# Patient Record
Sex: Female | Born: 1935 | Race: White | Hispanic: No | Smoking: Never smoker
Health system: Southern US, Community
[De-identification: ages and names within clinical notes are randomized; demographics above are authoritative.]

## PROBLEM LIST (undated history)

## (undated) DIAGNOSIS — H269 Unspecified cataract: Secondary | ICD-10-CM

## (undated) DIAGNOSIS — B181 Chronic viral hepatitis B without delta-agent: Secondary | ICD-10-CM

## (undated) DIAGNOSIS — M8580 Other specified disorders of bone density and structure, unspecified site: Secondary | ICD-10-CM

## (undated) DIAGNOSIS — Q7649 Other congenital malformations of spine, not associated with scoliosis: Secondary | ICD-10-CM

## (undated) DIAGNOSIS — J45909 Unspecified asthma, uncomplicated: Secondary | ICD-10-CM

## (undated) DIAGNOSIS — R748 Abnormal levels of other serum enzymes: Secondary | ICD-10-CM

## (undated) DIAGNOSIS — K219 Gastro-esophageal reflux disease without esophagitis: Secondary | ICD-10-CM

## (undated) DIAGNOSIS — M79673 Pain in unspecified foot: Secondary | ICD-10-CM

## (undated) DIAGNOSIS — J189 Pneumonia, unspecified organism: Secondary | ICD-10-CM

## (undated) DIAGNOSIS — E785 Hyperlipidemia, unspecified: Secondary | ICD-10-CM

## (undated) DIAGNOSIS — M1990 Unspecified osteoarthritis, unspecified site: Secondary | ICD-10-CM

## (undated) HISTORY — DX: Unspecified osteoarthritis, unspecified site: M19.90

## (undated) HISTORY — DX: Pain in unspecified foot: M79.673

## (undated) HISTORY — DX: Gastro-esophageal reflux disease without esophagitis: K21.9

## (undated) HISTORY — DX: Pneumonia, unspecified organism: J18.9

## (undated) HISTORY — PX: PARTIAL HYSTERECTOMY: SHX80

## (undated) HISTORY — DX: Abnormal levels of other serum enzymes: R74.8

## (undated) HISTORY — DX: Hyperlipidemia, unspecified: E78.5

## (undated) HISTORY — DX: Unspecified cataract: H26.9

## (undated) HISTORY — PX: OTHER SURGICAL HISTORY: SHX169

## (undated) HISTORY — DX: Chronic viral hepatitis B without delta-agent: B18.1

## (undated) HISTORY — DX: Other specified disorders of bone density and structure, unspecified site: M85.80

## (undated) HISTORY — PX: BACK SURGERY: SHX140

## (undated) HISTORY — DX: Other congenital malformations of spine, not associated with scoliosis: Q76.49

---

## 1998-11-25 ENCOUNTER — Encounter: Payer: Self-pay | Admitting: *Deleted

## 1998-11-28 ENCOUNTER — Ambulatory Visit (HOSPITAL_COMMUNITY): Admission: RE | Admit: 1998-11-28 | Discharge: 1998-11-28 | Payer: Self-pay | Admitting: *Deleted

## 1999-11-25 ENCOUNTER — Encounter: Admission: RE | Admit: 1999-11-25 | Discharge: 1999-11-25 | Payer: Self-pay | Admitting: Family Medicine

## 1999-11-25 ENCOUNTER — Encounter: Payer: Self-pay | Admitting: Family Medicine

## 2000-12-02 ENCOUNTER — Encounter: Admission: RE | Admit: 2000-12-02 | Discharge: 2000-12-02 | Payer: Self-pay | Admitting: Family Medicine

## 2000-12-02 ENCOUNTER — Encounter: Payer: Self-pay | Admitting: Family Medicine

## 2001-09-05 HISTORY — PX: US ECHOCARDIOGRAPHY: HXRAD669

## 2001-09-19 ENCOUNTER — Other Ambulatory Visit: Admission: RE | Admit: 2001-09-19 | Discharge: 2001-09-19 | Payer: Self-pay | Admitting: Family Medicine

## 2001-09-26 ENCOUNTER — Encounter: Payer: Self-pay | Admitting: Family Medicine

## 2001-09-26 ENCOUNTER — Encounter: Admission: RE | Admit: 2001-09-26 | Discharge: 2001-09-26 | Payer: Self-pay | Admitting: Family Medicine

## 2002-03-08 DIAGNOSIS — J189 Pneumonia, unspecified organism: Secondary | ICD-10-CM

## 2002-03-08 HISTORY — DX: Pneumonia, unspecified organism: J18.9

## 2002-04-26 ENCOUNTER — Encounter: Payer: Self-pay | Admitting: Family Medicine

## 2002-04-26 ENCOUNTER — Encounter: Admission: RE | Admit: 2002-04-26 | Discharge: 2002-04-26 | Payer: Self-pay | Admitting: Family Medicine

## 2002-08-08 ENCOUNTER — Inpatient Hospital Stay (HOSPITAL_COMMUNITY): Admission: EM | Admit: 2002-08-08 | Discharge: 2002-08-12 | Payer: Self-pay | Admitting: Emergency Medicine

## 2002-08-08 ENCOUNTER — Encounter: Payer: Self-pay | Admitting: Internal Medicine

## 2002-08-08 ENCOUNTER — Encounter: Payer: Self-pay | Admitting: Emergency Medicine

## 2002-08-12 ENCOUNTER — Encounter: Payer: Self-pay | Admitting: Internal Medicine

## 2002-10-08 ENCOUNTER — Ambulatory Visit (HOSPITAL_COMMUNITY): Admission: RE | Admit: 2002-10-08 | Discharge: 2002-10-08 | Payer: Self-pay | Admitting: Family Medicine

## 2002-10-08 ENCOUNTER — Encounter: Payer: Self-pay | Admitting: Family Medicine

## 2003-08-02 ENCOUNTER — Encounter: Admission: RE | Admit: 2003-08-02 | Discharge: 2003-08-02 | Payer: Self-pay | Admitting: Family Medicine

## 2004-05-07 ENCOUNTER — Ambulatory Visit: Payer: Self-pay | Admitting: Family Medicine

## 2004-07-27 ENCOUNTER — Ambulatory Visit: Payer: Self-pay | Admitting: Family Medicine

## 2004-08-07 ENCOUNTER — Ambulatory Visit: Payer: Self-pay | Admitting: Internal Medicine

## 2004-10-26 ENCOUNTER — Ambulatory Visit: Payer: Self-pay | Admitting: Family Medicine

## 2004-10-26 ENCOUNTER — Other Ambulatory Visit: Admission: RE | Admit: 2004-10-26 | Discharge: 2004-10-26 | Payer: Self-pay | Admitting: Family Medicine

## 2004-11-02 ENCOUNTER — Ambulatory Visit: Payer: Self-pay | Admitting: Family Medicine

## 2004-11-06 HISTORY — PX: COLONOSCOPY: SHX174

## 2004-11-11 ENCOUNTER — Ambulatory Visit: Payer: Self-pay | Admitting: Internal Medicine

## 2004-11-17 ENCOUNTER — Encounter: Admission: RE | Admit: 2004-11-17 | Discharge: 2004-11-17 | Payer: Self-pay | Admitting: Family Medicine

## 2004-11-23 ENCOUNTER — Ambulatory Visit: Payer: Self-pay | Admitting: Internal Medicine

## 2005-01-01 ENCOUNTER — Ambulatory Visit: Payer: Self-pay | Admitting: Internal Medicine

## 2005-03-08 HISTORY — PX: US RENAL/AORTA: HXRAD530

## 2005-06-10 ENCOUNTER — Ambulatory Visit: Payer: Self-pay | Admitting: Family Medicine

## 2005-07-12 ENCOUNTER — Ambulatory Visit: Payer: Self-pay | Admitting: Family Medicine

## 2005-07-28 ENCOUNTER — Ambulatory Visit: Payer: Self-pay | Admitting: Family Medicine

## 2005-10-19 ENCOUNTER — Ambulatory Visit: Payer: Self-pay | Admitting: Family Medicine

## 2005-10-28 ENCOUNTER — Ambulatory Visit: Payer: Self-pay | Admitting: Cardiology

## 2005-11-06 HISTORY — PX: CARDIOVASCULAR STRESS TEST: SHX262

## 2005-11-16 ENCOUNTER — Encounter: Payer: Self-pay | Admitting: Cardiology

## 2005-11-16 ENCOUNTER — Ambulatory Visit: Payer: Self-pay

## 2005-11-29 ENCOUNTER — Ambulatory Visit: Payer: Self-pay | Admitting: Cardiology

## 2005-12-07 ENCOUNTER — Ambulatory Visit: Payer: Self-pay | Admitting: Family Medicine

## 2006-04-08 ENCOUNTER — Encounter: Admission: RE | Admit: 2006-04-08 | Discharge: 2006-04-08 | Payer: Self-pay | Admitting: Family Medicine

## 2006-12-16 ENCOUNTER — Ambulatory Visit: Payer: Self-pay | Admitting: Family Medicine

## 2007-02-28 ENCOUNTER — Encounter (INDEPENDENT_AMBULATORY_CARE_PROVIDER_SITE_OTHER): Payer: Self-pay | Admitting: *Deleted

## 2007-03-08 ENCOUNTER — Ambulatory Visit: Payer: Self-pay | Admitting: Family Medicine

## 2007-03-27 ENCOUNTER — Encounter (INDEPENDENT_AMBULATORY_CARE_PROVIDER_SITE_OTHER): Payer: Self-pay | Admitting: *Deleted

## 2007-03-29 ENCOUNTER — Encounter (INDEPENDENT_AMBULATORY_CARE_PROVIDER_SITE_OTHER): Payer: Self-pay | Admitting: *Deleted

## 2007-04-24 ENCOUNTER — Encounter: Admission: RE | Admit: 2007-04-24 | Discharge: 2007-04-24 | Payer: Self-pay | Admitting: Family Medicine

## 2007-04-26 ENCOUNTER — Encounter (INDEPENDENT_AMBULATORY_CARE_PROVIDER_SITE_OTHER): Payer: Self-pay | Admitting: *Deleted

## 2007-04-26 ENCOUNTER — Telehealth: Payer: Self-pay | Admitting: Family Medicine

## 2007-04-26 ENCOUNTER — Ambulatory Visit: Payer: Self-pay | Admitting: Family Medicine

## 2007-05-02 ENCOUNTER — Telehealth: Payer: Self-pay | Admitting: Family Medicine

## 2007-05-12 ENCOUNTER — Ambulatory Visit: Payer: Self-pay | Admitting: Family Medicine

## 2007-05-16 ENCOUNTER — Emergency Department (HOSPITAL_COMMUNITY): Admission: EM | Admit: 2007-05-16 | Discharge: 2007-05-16 | Payer: Self-pay | Admitting: Family Medicine

## 2007-05-25 ENCOUNTER — Ambulatory Visit: Payer: Self-pay | Admitting: Family Medicine

## 2007-05-29 ENCOUNTER — Ambulatory Visit: Payer: Self-pay | Admitting: Family Medicine

## 2007-05-29 LAB — CONVERTED CEMR LAB
ALT: 20 units/L (ref 0–35)
AST: 22 units/L (ref 0–37)
Albumin: 3.6 g/dL (ref 3.5–5.2)
Alkaline Phosphatase: 101 units/L (ref 39–117)
Basophils Absolute: 0 10*3/uL (ref 0.0–0.1)
Basophils Relative: 0.1 % (ref 0.0–1.0)
CO2: 34 meq/L — ABNORMAL HIGH (ref 19–32)
Chloride: 100 meq/L (ref 96–112)
Creatinine, Ser: 0.8 mg/dL (ref 0.4–1.2)
GFR calc non Af Amer: 75 mL/min
Glucose, Bld: 78 mg/dL (ref 70–99)
HDL: 51 mg/dL (ref >39.0)
LDL Cholesterol: 116 mg/dL — ABNORMAL HIGH (ref 0–99)
MCV: 88.8 fL (ref 78.0–100.0)
Monocytes Relative: 7.9 % (ref 3.0–11.0)
Neutro Abs: 6.6 10*3/uL (ref 1.4–7.7)
Neutrophils Relative %: 64.5 % (ref 43.0–77.0)
Platelets: 294 10*3/uL (ref 150–400)
Potassium: 3.9 meq/L (ref 3.5–5.1)
Total Bilirubin: 0.7 mg/dL (ref 0.3–1.2)
VLDL: 14 mg/dL (ref 0–40)
WBC: 10.1 10*3/uL (ref 4.5–10.5)

## 2007-05-30 ENCOUNTER — Ambulatory Visit: Payer: Self-pay | Admitting: Family Medicine

## 2007-05-30 ENCOUNTER — Ambulatory Visit: Payer: Self-pay

## 2007-05-31 LAB — CONVERTED CEMR LAB
Basophils Relative: 0.4 % (ref 0.0–1.0)
Eosinophils Absolute: 0.1 10*3/uL (ref 0.0–0.6)
Eosinophils Relative: 0.7 % (ref 0.0–5.0)
Hemoglobin: 13.4 g/dL (ref 12.0–15.0)
Lymphocytes Relative: 24.9 % (ref 12.0–46.0)
MCHC: 33 g/dL (ref 30.0–36.0)
Monocytes Absolute: 1.1 10*3/uL — ABNORMAL HIGH (ref 0.2–0.7)
Monocytes Relative: 9.6 % (ref 3.0–11.0)
RBC: 4.53 M/uL (ref 3.87–5.11)
RDW: 12.3 % (ref 11.5–14.6)

## 2007-06-01 ENCOUNTER — Encounter: Payer: Self-pay | Admitting: Family Medicine

## 2007-07-14 ENCOUNTER — Encounter: Payer: Self-pay | Admitting: Family Medicine

## 2007-09-05 ENCOUNTER — Encounter: Payer: Self-pay | Admitting: Family Medicine

## 2007-12-04 ENCOUNTER — Ambulatory Visit: Payer: Self-pay | Admitting: Family Medicine

## 2007-12-11 ENCOUNTER — Ambulatory Visit: Payer: Self-pay | Admitting: Family Medicine

## 2008-02-20 ENCOUNTER — Encounter: Payer: Self-pay | Admitting: Family Medicine

## 2008-02-26 ENCOUNTER — Telehealth: Payer: Self-pay | Admitting: Family Medicine

## 2008-04-24 ENCOUNTER — Encounter: Admission: RE | Admit: 2008-04-24 | Discharge: 2008-04-24 | Payer: Self-pay | Admitting: Family Medicine

## 2008-04-30 ENCOUNTER — Encounter (INDEPENDENT_AMBULATORY_CARE_PROVIDER_SITE_OTHER): Payer: Self-pay | Admitting: *Deleted

## 2008-06-12 ENCOUNTER — Ambulatory Visit: Payer: Self-pay | Admitting: Family Medicine

## 2008-06-12 LAB — CONVERTED CEMR LAB
Bilirubin Urine: NEGATIVE
Casts: 0 /lpf
Protein, U semiquant: NEGATIVE
Urine crystals, microscopic: 0 /hpf
WBC, UA: 0 cells/hpf
Yeast, UA: 0

## 2008-06-17 ENCOUNTER — Ambulatory Visit: Payer: Self-pay | Admitting: Cardiovascular Disease

## 2008-06-17 ENCOUNTER — Encounter: Payer: Self-pay | Admitting: Family Medicine

## 2008-06-17 LAB — CONVERTED CEMR LAB
Basophils Absolute: 0 10*3/uL (ref 0.0–0.1)
Basophils Relative: 0.1 % (ref 0.0–3.0)
CO2: 35 meq/L — ABNORMAL HIGH (ref 19–32)
Chloride: 104 meq/L (ref 96–112)
Cholesterol: 201 mg/dL — ABNORMAL HIGH (ref 0–200)
Eosinophils Absolute: 0.1 10*3/uL (ref 0.0–0.7)
Eosinophils Relative: 1.6 % (ref 0.0–5.0)
HDL: 60 mg/dL (ref >39.00)
Hemoglobin: 13.4 g/dL (ref 12.0–15.0)
Lymphocytes Relative: 39.4 % (ref 12.0–46.0)
MCHC: 33.7 g/dL (ref 30.0–36.0)
Monocytes Relative: 10.3 % (ref 3.0–12.0)
RBC: 4.4 M/uL (ref 3.87–5.11)
VLDL: 14.6 mg/dL (ref 0.0–40.0)

## 2008-06-19 ENCOUNTER — Telehealth (INDEPENDENT_AMBULATORY_CARE_PROVIDER_SITE_OTHER): Payer: Self-pay | Admitting: *Deleted

## 2008-06-20 ENCOUNTER — Ambulatory Visit: Payer: Self-pay

## 2008-06-20 ENCOUNTER — Encounter: Payer: Self-pay | Admitting: Cardiology

## 2008-09-05 ENCOUNTER — Encounter: Payer: Self-pay | Admitting: Family Medicine

## 2008-09-13 ENCOUNTER — Ambulatory Visit: Payer: Self-pay | Admitting: Family Medicine

## 2008-09-13 LAB — CONVERTED CEMR LAB
Bilirubin Urine: NEGATIVE
Glucose, Urine, Semiquant: NEGATIVE
Nitrite: NEGATIVE
Specific Gravity, Urine: 1.015
Urobilinogen, UA: 0.2

## 2008-09-14 ENCOUNTER — Encounter (INDEPENDENT_AMBULATORY_CARE_PROVIDER_SITE_OTHER): Payer: Self-pay | Admitting: Internal Medicine

## 2008-12-10 ENCOUNTER — Ambulatory Visit: Payer: Self-pay | Admitting: Family Medicine

## 2009-01-22 ENCOUNTER — Encounter: Payer: Self-pay | Admitting: Family Medicine

## 2009-03-13 ENCOUNTER — Encounter: Payer: Self-pay | Admitting: Family Medicine

## 2009-08-21 ENCOUNTER — Encounter: Admission: RE | Admit: 2009-08-21 | Discharge: 2009-08-21 | Payer: Self-pay | Admitting: Family Medicine

## 2009-08-27 ENCOUNTER — Encounter (INDEPENDENT_AMBULATORY_CARE_PROVIDER_SITE_OTHER): Payer: Self-pay | Admitting: *Deleted

## 2009-09-11 ENCOUNTER — Encounter: Payer: Self-pay | Admitting: Family Medicine

## 2009-10-07 ENCOUNTER — Ambulatory Visit: Payer: Self-pay | Admitting: Family Medicine

## 2009-10-15 ENCOUNTER — Ambulatory Visit: Payer: Self-pay | Admitting: Family Medicine

## 2009-10-30 ENCOUNTER — Ambulatory Visit: Payer: Self-pay | Admitting: Family Medicine

## 2009-10-31 LAB — CONVERTED CEMR LAB
AST: 25 units/L (ref 0–37)
Albumin: 3.9 g/dL (ref 3.5–5.2)
Basophils Absolute: 0 10*3/uL (ref 0.0–0.1)
Bilirubin, Direct: 0.1 mg/dL (ref 0.0–0.3)
Chloride: 103 meq/L (ref 96–112)
Cholesterol: 216 mg/dL — ABNORMAL HIGH (ref 0–200)
Eosinophils Absolute: 0.1 10*3/uL (ref 0.0–0.7)
HDL: 55.3 mg/dL (ref >39.00)
Hemoglobin: 13.6 g/dL (ref 12.0–15.0)
Hep B S Ab: NEGATIVE
Hepatitis B Surface Ag: NEGATIVE
Lymphocytes Relative: 36.2 % (ref 12.0–46.0)
Lymphs Abs: 3.3 10*3/uL (ref 0.7–4.0)
MCV: 91.2 fL (ref 78.0–100.0)
Monocytes Relative: 9.5 % (ref 3.0–12.0)
Neutro Abs: 4.9 10*3/uL (ref 1.4–7.7)
Phosphorus: 3.5 mg/dL (ref 2.3–4.6)
TSH: 1.82 microintl units/mL (ref 0.35–5.50)
Total Bilirubin: 0.8 mg/dL (ref 0.3–1.2)
Total CHOL/HDL Ratio: 4
Vit D, 25-Hydroxy: 73 ng/mL (ref 30–89)

## 2009-12-18 ENCOUNTER — Ambulatory Visit: Payer: Self-pay | Admitting: Family Medicine

## 2010-04-09 NOTE — Letter (Signed)
Summary: [facility]  [facility]   Imported By: Lanelle Bal 04/08/2009 10:10:23  _____________________________________________________________________  External Attachment:    Type:   Image     Comment:   External Document

## 2010-04-09 NOTE — Letter (Signed)
Summary: [facility]  [facility]   Imported By: Lanelle Bal 09/18/2009 10:53:53  _____________________________________________________________________  External Attachment:    Type:   Image     Comment:   External Document

## 2010-04-09 NOTE — Miscellaneous (Signed)
Summary: Controlled Substance Agreement  Controlled Substance Agreement   Imported By: Lanelle Bal 11/06/2009 11:25:25  _____________________________________________________________________  External Attachment:    Type:   Image     Comment:   External Document

## 2010-04-09 NOTE — Assessment & Plan Note (Signed)
Summary: RECHECK THUMB   Vital Signs:  Patient profile:   76 year old female Height:      60 inches Weight:      137 pounds BMI:     26.85 Temp:     98.6 degrees F oral Pulse rate:   76 / minute Pulse rhythm:   regular BP sitting:   108 / 68  (left arm) Cuff size:   regular  Vitals Entered By: Lewanda Rife LPN (October 15, 2009 12:14 PM) CC: recheck left thumb   History of Present Illness: here for re check of thumb with bad arthritis - erosive DIP changes on x ray nl uric acid   overall  better but not resolved  wore splint- no longer needs it  is no longer needing pain med   Allergies: 1)  ! * Evista 2)  Fosamax 3)  * Contrast Dye  Past History:  Past Medical History: Last updated: 06/12/2008 GERD Osteoarthritis pneumonia 2004 foot problems L - ? nerve problem- proceedure mild hyperlipidemia    derm-  Past Surgical History: Last updated: 03/08/2007 Hysterectomy- partial Back surgery pneumonia 2004 "Blocked kidney" Dexa- osteopenia (11/1999) improved (12/2001), decreased BMD (07/2004) Abd [place]- normal (09/2001) Colonoscopy- diverticulosis, [facility] (11/2004) Renal US- neg (2007) Stress myoview- neg (11/2005)  Family History: Last updated: 06/12/2008 Father: lung ca Mother: deceased - dementia, tia, PE, OP brother MI brother with leukemia 2010  twin sister bladder cancer  brother cva brother etoh  Social History: Last updated: 06/12/2008 Marital Status: widowed- lost husb nov 2004 Children: none Occupation: retired sometimes walks for exercise  planning trip to [place] 7/10  Risk Factors: Smoking Status: never (03/08/2007)  Review of Systems General:  Denies fatigue, fever, and loss of appetite. Eyes:  Denies blurring and eye irritation. CV:  Denies chest pain or discomfort, palpitations, and shortness of breath with exertion. Resp:  Denies cough. MS:  Complains of joint pain and stiffness; denies joint swelling, cramps, and muscle  weakness. Derm:  Denies poor wound healing and rash. Neuro:  Denies numbness, tingling, and weakness.  Physical Exam  General:  Well-developed,well-nourished,in no acute distress; alert,appropriate and cooperative throughout examination Head:  normocephalic, atraumatic, and no abnormalities observed.   Neck:  No deformities, masses, or tenderness noted. Heart:  Normal rate and regular rhythm. S1 and S2 normal without gallop, murmur, click, rub or other extra sounds. Msk:  L thumb has changes of oa and some mild MCP tenderness no longer red/swollen/ bruised or warm nl rom  nl sensation and perfusion Pulses:  R and L carotid,radial,femoral,dorsalis pedis and posterior tibial pulses are full and equal bilaterally Extremities:  no CCE Neurologic:  strength normal in all extremities, sensation intact to light touch, and DTRs symmetrical and normal.   Skin:  Intact without suspicious lesions or rashes Cervical Nodes:  No lymphadenopathy noted Psych:  normal affect, talkative and pleasant    Impression & Recommendations:  Problem # 1:  THUMB PAIN, LEFT (ICD-729.5) Assessment Improved this is much imp after splinting and short course of nsaid pt can now use it rev x ray showing severe oa  nl uric acid - so doubt gout if pain returns - adv to use splint and update me/ may consider hand surgeon visit  Complete Medication List: 1)  Omeprazole 20 Mg Cpdr (Omeprazole) .Marland Kitchen.. 1 by mouth once daily 2)  Adult Aspirin Low Strength 81 Mg Tbdp (Aspirin) .... Take one by mouth daily 3)  Allegra 180 Mg Tabs (Fexofenadine hcl) .... One  by mouth once daily 4)  Singulair 10 Mg Tabs (Montelukast sodium) .... One by mouth once daily 5)  Advair Diskus 250-50 Mcg/dose Misc (Fluticasone-salmeterol) .Marland Kitchen.. 1 inhalation two times a day 6)  Albuterol Mdi  .... As needed 7)  Miacalcin 200 Unit/act Nasal Soln (Calcitonin (salmon)) .Marland Kitchen.. 1 spray in one nostril daily- alternate nostril 8)  Glucosamine  .... By mouth  as directed 9)  Vitamin D 2000 Unit Tabs (Cholecalciferol) .... Take 1 tablet by mouth once a day 10)  Fish Oil Oil (Fish oil) .... One three times a day. 11)  Diclofenac Sodium 75 Mg Tbec (Diclofenac sodium) .Marland Kitchen.. 1 tab by mouth two times a day 12)  Vicodin 5-500 Mg Tabs (Hydrocodone-acetaminophen) .Marland Kitchen.. 1 tab by mouth q 6 hours as needed breakthru pain  Patient Instructions: 1)  I'm glad thumb is improving  2)  you can still use the splint if sore  3)  your x rays show fairly severe arthritis 4)  if you want to see a hand specialist in the future (if pain worsens)- just call and leave me a message 5)  stay active but don't overdo it  6)  labs were ok too- do not suspect gout   Current Allergies (reviewed today): ! * EVISTA FOSAMAX * CONTRAST DYE

## 2010-04-09 NOTE — Assessment & Plan Note (Signed)
Summary: FLU SHOT/CLE  Nurse Visit   Allergies: 1)  ! * Evista 2)  Fosamax 3)  * Contrast Dye  Orders Added: 1)  Flu Vaccine 55yrs + MEDICARE PATIENTS [Q2039] 2)  Administration Flu vaccine - MCR [G0008]    Flu Vaccine Consent Questions     Do you have a history of severe allergic reactions to this vaccine? no    Any prior history of allergic reactions to egg and/or gelatin? no    Do you have a sensitivity to the preservative Thimersol? no    Do you have a past history of Guillan-Barre Syndrome? no    Do you currently have an acute febrile illness? no    Have you ever had a severe reaction to latex? no    Vaccine information given and explained to patient? yes    Are you currently pregnant? no    Lot Number:[id number]   Exp Date:09/05/2010   Site Given  Left Deltoid IM

## 2010-04-09 NOTE — Assessment & Plan Note (Signed)
Summary: CPX   Vital Signs:  Patient profile:   76 year old female Height:      60 inches Weight:      132.75 pounds BMI:     26.02 Temp:     97.6 degrees F oral Pulse rate:   68 / minute Pulse rhythm:   regular BP sitting:   104 / 66  (left arm) Cuff size:   regular  Vitals Entered By: Lewanda Rife LPN (October 30, 2009 10:35 AM) CC: CPX LMP part hyst 1965   History of Present Illness: here for check up of chronic medical problems and to rev health mt list   has been doing ok other than a thumb problem more arthritis pain in general -- from all joints  also damaged toenail with shoe- is black  wt is down 5 lb is eating nutrasystem diet -- and does well with that  will switch over to mt soon   bp 104/66   due for lab/ lipid-- will do today   part hyst in past, neg pap in 06 no gyn symptoms or problems    colonosc 06- due 2016- had tics / no polyp  4/10 dexa with osteopenia on miacalcin- no problems with that  intol to other meds ca and D- is taking regularly   Td04  ptx in 09  flu shot zoster status--has already had shingles   stays active - never slows down   nl mam june- no lumps on self exam  ? hx of hep B in the past --- when tried to give blood hx of elevated lffts? wants labs  Allergies: 1)  ! * Evista 2)  Fosamax 3)  * Contrast Dye  Past History:  Past Surgical History: Last updated: 03/08/2007 Hysterectomy- partial Back surgery pneumonia 2004 "Blocked kidney" Dexa- osteopenia (11/1999) improved (12/2001), decreased BMD (07/2004) Abd [place]- normal (09/2001) Colonoscopy- diverticulosis, hems (11/2004) Renal US- neg (2007) Stress myoview- neg (11/2005)  Family History: Last updated: 06/12/2008 Father: lung ca Mother: deceased - dementia, tia, PE, OP brother MI brother with leukemia 2010  twin sister bladder cancer  brother cva brother etoh  Social History: Last updated: 06/12/2008 Marital Status: widowed- lost husb nov  2004 Children: none Occupation: retired sometimes walks for exercise  planning trip to [place] 7/10  Risk Factors: Smoking Status: never (03/08/2007)  Past Medical History: GERD Osteoarthritis pneumonia 2004 foot problems L - ? nerve problem- proceedure mild hyperlipidemia  osteopenia  [facility] of elevated [facility] enzymes   derm-  Review of Systems General:  Denies fatigue, loss of appetite, and malaise. Eyes:  Denies blurring and eye irritation. CV:  Denies chest pain or discomfort, lightheadness, and palpitations. Resp:  Denies cough and shortness of breath. GI:  Denies abdominal pain, change in bowel habits, indigestion, and nausea. GU:  Denies abnormal vaginal bleeding, discharge, dysuria, hematuria, and urinary frequency. MS:  Complains of joint pain and stiffness; denies joint redness, joint swelling, cramps, and muscle weakness. Derm:  Denies itching, lesion(s), poor wound healing, and rash. Neuro:  Denies numbness and tingling. Psych:  Denies anxiety and depression. Endo:  Denies excessive thirst and excessive urination. Heme:  Denies abnormal bruising and bleeding.  Physical Exam  General:  Well-developed,well-nourished,in no acute distress; alert,appropriate and cooperative throughout examination Head:  normocephalic, atraumatic, and no abnormalities observed.   Eyes:  vision grossly intact, pupils equal, pupils round, and pupils reactive to light.  no conjunctival pallor, injection or icterus  Ears:  R ear normal and L ear normal.  - scant cerumen Nose:  no nasal discharge.   Mouth:  pharynx pink and moist.   Neck:  supple with full rom and no masses or thyromegally, no JVD or carotid bruit  Chest Wall:  No deformities, masses, or tenderness noted. Breasts:  No mass, nodules, thickening, tenderness, bulging, retraction, inflamation, nipple discharge or skin changes noted.   Lungs:  Normal respiratory effort, chest expands symmetrically.  Lungs are clear to auscultation, no crackles or wheezes. Heart:  Normal rate and regular rhythm. S1 and S2 normal without gallop, murmur, click, rub or other extra sounds. Abdomen:  Bowel sounds positive,abdomen soft and non-tender without masses, organomegaly or hernias noted. no renal bruits  Msk:  No deformity or scoliosis noted of thoracic or lumbar spine.  some OA in hands  Pulses:  R and L carotid,radial,femoral,dorsalis pedis and posterior tibial pulses are full and equal bilaterally Extremities:  No clubbing, cyanosis, edema, or deformity noted with normal full range of motion of all joints.   Neurologic:  sensation intact to light touch, gait normal, and DTRs symmetrical and normal.   Skin:  Intact without suspicious lesions or rashes Cervical Nodes:  No lymphadenopathy noted Axillary Nodes:  No palpable lymphadenopathy Inguinal Nodes:  No significant adenopathy Psych:  normal affect, talkative and pleasant    Impression & Recommendations:  Problem # 1:  HYPERLIPIDEMIA (ICD-272.4) Assessment Unchanged  lab today rev low sat fat diet has been controlled with diet Orders: Venipuncture (81191) TLB-Lipid Panel (80061-LIPID) TLB-Renal Function Panel (80069-RENAL) TLB-CBC Platelet - w/Differential (85025-CBCD) TLB-Hepatic/Liver Function Pnl (80076-HEPATIC) TLB-TSH (Thyroid Stimulating Hormone) (84443-TSH) T-Vitamin D (25-Hydroxy) (47829-56213)  Labs Reviewed: SGOT: 23 (06/12/2008)   SGPT: 27 (06/12/2008)   HDL:60.00 (06/12/2008), 51.0 (05/25/2007)  LDL:116 (05/25/2007)  Chol:201 (06/12/2008), 181 (05/25/2007)  Trig:73.0 (06/12/2008), 71 (05/25/2007)  Problem # 2:  OSTEOPENIA (ICD-733.90) Assessment: Unchanged intol of bisphosphenates so far on miacalcin and ca and D check D level  dexa due in 4/12 depending on result may disc reclast or other options  Her updated medication list for this problem includes:    Miacalcin 200 Unit/act Nasal Soln (Calcitonin (salmon)) .Marland Kitchen... 1  spray in one nostril daily- alternate nostril    Vitamin D 2000 Unit Tabs (Cholecalciferol) .Marland Kitchen... Take 1 tablet by mouth once a day  Orders: Venipuncture (08657) TLB-Lipid Panel (80061-LIPID) TLB-Renal Function Panel (80069-RENAL) TLB-CBC Platelet - w/Differential (85025-CBCD) TLB-Hepatic/Liver Function Pnl (80076-HEPATIC) TLB-TSH (Thyroid Stimulating Hormone) (84443-TSH) T-Vitamin D (25-Hydroxy) (84696-29528)  Problem # 3:  GERD (ICD-530.81) Assessment: Unchanged overall very good with PPI  check lab today and update  Her updated medication list for this problem includes:    Omeprazole 20 Mg Cpdr (Omeprazole) .Marland Kitchen... 1 by mouth once daily  Orders: Venipuncture (41324) TLB-Lipid Panel (80061-LIPID) TLB-Renal Function Panel (80069-RENAL) TLB-CBC Platelet - w/Differential (85025-CBCD) TLB-Hepatic/Liver Function Pnl (80076-HEPATIC) TLB-TSH (Thyroid Stimulating Hormone) (84443-TSH) T-Vitamin D (25-Hydroxy) (40102-72536)  Problem # 4:  LIVER FUNCTION TESTS, ABNORMAL (ICD-794.8) Assessment: Unchanged - in past --also ? [facility]? -- will check today re check today disc avoiding hepatotoxic meds Orders: Venipuncture (64403) TLB-Lipid Panel (80061-LIPID) TLB-Renal Function Panel (80069-RENAL) TLB-CBC Platelet - w/Differential (85025-CBCD) TLB-Hepatic/Liver Function Pnl (80076-HEPATIC) TLB-TSH (Thyroid Stimulating Hormone) (84443-TSH) T-Vitamin D (25-Hydroxy) (47425-95638) T-Hepatitis B Core Antibody, IGM (75643-32951) T-Hepatitis B Core Antibody (88416-60630) T-Hepatitis B Surface Antigen (16010-93235) T-Hepatitis B Surface Antibody (57322-02542)  Advised patient to avoid alcohol and Tylenol. Call for worsening symptoms.  Problem # 5:  HEPATITIS B, HX OF (ICD-V12.09) Assessment: New per pt - [facility] from [facility]ating blood  will check profile and update  Complete Medication List: 1)  Omeprazole 20 Mg Cpdr (Omeprazole) .Marland Kitchen.. 1  by mouth once daily 2)  Adult Aspirin Low Strength 81 Mg Tbdp (Aspirin) .... Take one by mouth daily 3)  Allegra 180 Mg Tabs (Fexofenadine hcl) .... One by mouth once daily 4)  Singulair 10 Mg Tabs (Montelukast sodium) .... One by mouth once daily 5)  Advair Diskus 250-50 Mcg/dose Misc (Fluticasone-salmeterol) .Marland Kitchen.. 1 inhalation two times a day 6)  Albuterol Mdi  .... As needed 7)  Miacalcin 200 Unit/act Nasal Soln (Calcitonin (salmon)) .Marland Kitchen.. 1 spray in one nostril daily- alternate nostril 8)  Glucosamine  .... By mouth as directed 9)  Vitamin D 2000 Unit Tabs (Cholecalciferol) .... Take 1 tablet by mouth once a day 10)  Fish Oil Oil (Fish oil) .... One three times a day. 11)  Diclofenac Sodium 75 Mg Tbec (Diclofenac sodium) .Marland Kitchen.. 1 tab by mouth two times a day as needed 12)  Vicodin 5-500 Mg Tabs (Hydrocodone-acetaminophen) .Marland Kitchen.. 1 tab by mouth q 6 hours as needed breakthru pain  Other Orders: Flu Vaccine 57yrs + (54098) Administration Flu vaccine - MCR (J1914)  Patient Instructions: 1)  check with your insurance-- if you want the shingles vaccine then call us in 1-2 months to schedule  2)  labs today 3)  stay active  4)  no change in medicines 5)  flu shot today   Current Allergies (reviewed today): ! * EVISTA FOSAMAX * CONTRAST DYE     Flu Vaccine Consent Questions     Do you have a history of severe allergic reactions to this vaccine? no    Any prior history of allergic reactions to egg and/or gelatin? no    Do you have a sensitivity to the preservative Thimersol? no    Do you have a past history of Guillan-Barre Syndrome? no    Do you currently have an acute febrile illness? no    Have you ever had a severe reaction to latex? no    Vaccine information given and explained to patient? yes    Are you currently pregnant? no    Lot Number:[id number]   Exp Date:09/05/2010   Site Given  Left Deltoid IMedflu

## 2010-04-09 NOTE — Letter (Signed)
Summary: Results Follow up Letter  [facility]  [address] [place], [place] [address]   Phone: [phone]  Fax: [phone]    08/27/2009 MRN: [id number]    STEFFANI DIONISIO [address] [place], [place]  [address]    Dear Ms. SCHOENBERGER,  The following are the results of your recent test(s):  Test         Result    Pap Smear:        Normal _____  Not Normal _____ Comments: ______________________________________________________ Cholesterol: LDL(Bad cholesterol):         Your goal is less than:         HDL (Good cholesterol):       Your goal is more than: Comments:  ______________________________________________________ Mammogram:        Normal ___X__  Not Normal _____ Comments:  Yearly follow up is recommended.   ___________________________________________________________________ Hemoccult:        Normal _____  Not normal _______ Comments:    _____________________________________________________________________ Other Tests:    We routinely do not discuss normal results over the telephone.  If you desire a copy of the results, or you have any questions about this information we can discuss them at your next office visit.   Sincerely,    Marne A. Milinda Antis, M.D.  MAT:lsf

## 2010-04-09 NOTE — Assessment & Plan Note (Signed)
Summary: SORE THUMB   Vital Signs:  Patient profile:   76 year old female Height:      60 inches Weight:      136.2 pounds BMI:     26.70 Temp:     98.4 degrees F oral Pulse rate:   72 / minute Pulse rhythm:   regular BP sitting:   130 / 80  (left arm) Cuff size:   regular  Vitals Entered By: Benny Lennert CMA Duncan Dull) (October 07, 2009 8:58 AM)  History of Present Illness: Chief complaint sore thumb  In last week she has been washing windows.  Did hit window with hand to get it unstuck.   Pain and swelling at base of left thumb. Cannot move thumb.  Occ pain radiates up to upper arm.   Pain is fairly severe, keeping her up at night. Taking aspirin..minimal improvement.  No history of gout. Has history of osteoarthritis in fingers and knee. Has DXA showing osteopenia in 2009.Marland KitchenMarland Kitchenon micalcin.  Allergies: 1)  ! * Evista 2)  Fosamax 3)  * Contrast Dye  Past History:  Past medical, surgical, family and social histories (including risk factors) reviewed, and no changes noted (except as noted below).  Past Medical History: Reviewed history from 06/12/2008 and no changes required. GERD Osteoarthritis pneumonia 2004 foot problems L - ? nerve problem- proceedure mild hyperlipidemia    derm-  Past Surgical History: Reviewed history from 03/08/2007 and no changes required. Hysterectomy- partial Back surgery pneumonia 2004 "Blocked kidney" Dexa- osteopenia (11/1999) improved (12/2001), decreased BMD (07/2004) Abd [place]- normal (09/2001) Colonoscopy- diverticulosis, hems (11/2004) Renal US- neg (2007) Stress myoview- neg (11/2005)  Family History: Reviewed history from 06/12/2008 and no changes required. Father: lung ca Mother: deceased - dementia, tia, PE, OP brother MI brother with leukemia 2010  twin sister bladder cancer  brother cva brother etoh  Social History: Reviewed history from 06/12/2008 and no changes required. Marital Status: widowed- lost husb  nov 2004 Children: none Occupation: retired sometimes walks for exercise  planning trip to [place] 7/10  Review of Systems General:  Denies fatigue and fever. CV:  Denies chest pain or discomfort. Resp:  Denies shortness of breath.  Physical Exam  General:  Well-developed,well-nourished,in no acute distress; alert,appropriate and cooperative throughout examination Mouth:  MMM Neck:  no cervical or supraclavicular lymphadenopathy no carotid bruit or thyromegaly  Lungs:  Normal respiratory effort, chest expands symmetrically. Lungs are clear to auscultation, no crackles or wheezes. Heart:  Normal rate and regular rhythm. S1 and S2 normal without gallop, murmur, click, rub or other extra sounds. Msk:  exquistly tender , brusing, redness, heat and swelling at base of left thumb at MCP joint  unable to show ROm of thumb given pain  No DIP, PIP joint pain  Pulses:  R and L posterior tibial pulses are full and equal bilaterally  Neurologic:  No cranial nerve deficits noted. Station and gait are normal.. Sensory functions appear intact. Skin:  Intact without suspicious lesions or rashes   Impression & Recommendations:  Problem # 1:  THUMB PAIN, LEFT (ICD-729.5)  Orders: T-Hand Left 3 Views (73130TC) TLB-Uric Acid, Blood (84550-URIC) Prescription Created Electronically [phone]): Showing no clear fracture... CMC joint arthritis evident.   Will have radiologist review films. Eval for gout with uric acid given presnetation. Most likely CMC joint arthritis, severe.  Placed in thumb spica splint. Start antinflammatory with vicodin for breakthru pain. Follow up will be arranged when results finalized.   Total visit time  > 50% spent counseling and cordinating patients care   Complete Medication List: 1)  Omeprazole 20 Mg Cpdr (Omeprazole) .Marland Kitchen.. 1 by mouth once daily 2)  Adult Aspirin Low Strength 81 Mg Tbdp (Aspirin) .... Take one by mouth daily 3)  Allegra 180 Mg Tabs (Fexofenadine  hcl) .... One by mouth once daily 4)  Singulair 10 Mg Tabs (Montelukast sodium) .... One by mouth once daily 5)  Advair Diskus 250-50 Mcg/dose Misc (Fluticasone-salmeterol) .Marland Kitchen.. 1 inhalation two times a day 6)  Albuterol Mdi  .... As needed 7)  Miacalcin 200 Unit/act Nasal Soln (Calcitonin (salmon)) .Marland Kitchen.. 1 spray in one nostril daily- alternate nostril 8)  Glucosamine  .... By mouth as directed 9)  Vitamin D 2000 Unit Tabs (Cholecalciferol) .... Take 1 tablet by mouth once a day 10)  Fish Oil Oil (Fish oil) .... One three times a day. 11)  Diclofenac Sodium 75 Mg Tbec (Diclofenac sodium) .Marland Kitchen.. 1 tab by mouth two times a day 12)  Vicodin 5-500 Mg Tabs (Hydrocodone-acetaminophen) .Marland Kitchen.. 1 tab by mouth q 6 hours as needed breakthru pain  Patient Instructions: 1)  Waer thumb spica splint. 2)  Use diclofenac two times a day with food for inflamation and pain. 3)  Use vicodin for breakthru pain 4)  We will call you with final X-ray results and .lab results.  5)   Call if pain not controlled. 6)   WE will recommend follow up appt when X-ray and lab results back.  Prescriptions: VICODIN 5-500 MG TABS (HYDROCODONE-ACETAMINOPHEN) 1 tab by mouth q 6 hours as needed breakthru pain  #20 x 0   Entered and Authorized by:   Kerby Nora MD   Signed by:   Kerby Nora MD on 10/07/2009   Method used:   Print then Give to Patient   RxID:   [id number] DICLOFENAC SODIUM 75 MG TBEC (DICLOFENAC SODIUM) 1 tab by mouth two times a day  #30 x 0   Entered and Authorized by:   Kerby Nora MD   Signed by:   Kerby Nora MD on 10/07/2009   Method used:   Electronically to        [facility]  [address] (retail)       [address]       [place], [place]  [address]       Ph: [phone] or [phone]       Fax: [phone]   RxID:   [id number]   Current Allergies (reviewed today): ! * EVISTA FOSAMAX * CONTRAST DYE

## 2010-04-28 ENCOUNTER — Encounter: Payer: Self-pay | Admitting: Family Medicine

## 2010-05-04 ENCOUNTER — Other Ambulatory Visit (INDEPENDENT_AMBULATORY_CARE_PROVIDER_SITE_OTHER): Payer: Medicare Other

## 2010-05-04 ENCOUNTER — Encounter (INDEPENDENT_AMBULATORY_CARE_PROVIDER_SITE_OTHER): Payer: Self-pay | Admitting: *Deleted

## 2010-05-04 ENCOUNTER — Other Ambulatory Visit: Payer: Self-pay | Admitting: Family Medicine

## 2010-05-04 DIAGNOSIS — E785 Hyperlipidemia, unspecified: Secondary | ICD-10-CM

## 2010-05-04 LAB — AST: AST: 48 U/L — ABNORMAL HIGH (ref 0–37)

## 2010-05-04 LAB — LIPID PANEL: Total CHOL/HDL Ratio: 3

## 2010-05-04 LAB — ALT: ALT: 45 U/L — ABNORMAL HIGH (ref 0–35)

## 2010-05-26 NOTE — Miscellaneous (Signed)
Summary: [facility]   Allergy   Imported By: Kassie Mends 05/11/2010 10:55:30  _____________________________________________________________________  External Attachment:    Type:   Image     Comment:   External Document

## 2010-07-21 NOTE — Assessment & Plan Note (Signed)
[facility] OFFICE NOTE   Erika Evans, PLATTS                    MRN:          [id number]  DATE:06/17/2008                            DOB:          13-Mar-1934    REASON FOR EVALUATION:  Chest pain.   HISTORY OF PRESENT ILLNESS:  Erika Evans is a delightful 77 year old  woman who presents for evaluation of chest pain.  The patient reports a  pressure and fullness that occurs in the epigastric region and  radiates up to the neck.  Her symptoms have predominately occurred at  rest.  She reports 3-4 episodes per year over the past several years.  She has been awakened at night with these symptoms.  Her pain is  relieved with belching or burping.  While most of these episodes have  been nocturnal, she has felt symptoms beginning with either exposure to  cold weather or exercise, but she has not had significant pain with  either of those situations.  There has been no change in the pattern of  her chest pain.  The patient does note severe acid reflux, but her  symptoms are fairly well controlled with a proton pump inhibitor and  avoidance of certain foods.  Her symptoms of chest pain feel different  than her typical reflux symptoms.  She denies dyspnea, orthopnea, PND,  edema, lightheadedness, or syncope.  She had a Myoview stress scan by  report in 2007 that was negative for ischemia.   HOME MEDICATIONS:  1. Omeprazole 20 mg daily.  2. Aspirin 81 mg daily.  3. Allegra 180 mg daily.  4. Singulair 10 mg daily.  5. Advair Diskus 250/50 mcg b.i.d.  6. Albuterol as needed.  7. Miacalcin 200 units 1 spray in each nostril daily.  8. Glucosamine as directed.   Allergies include EVISTA, FOSAMAX, and CONTRAST MEDIA.   PAST MEDICAL HISTORY:  1. Partial hysterectomy.  2. Lumbar back surgery.  3. Osteopenia.  4. Diverticulosis.  5. Gastroesophageal reflux disease.  6. Osteoarthritis.  7. Mild hyperlipidemia.   FAMILY HISTORY:  The patient's mother died at age 65 of Alzheimer  dementia, father died at age 59 of lung cancer.  She has 2 sisters and 1  brother who are alive.  Her brother has heart problems as well as  leukemia.   SOCIAL HISTORY:  The patient is widowed.  She is retired.  She walks  occasionally for exercise.  She does not smoke cigarettes or drink  alcohol.   REVIEW OF SYSTEMS:  A 10-point review of systems was performed.  There  are no pertinent positives to report except as outlined above in the  HPI.   PHYSICAL EXAMINATION:  GENERAL:  The patient is alert and oriented.  She  is in no acute distress.  VITAL SIGNS:  Weight is 150 pounds, blood pressure 122/77, heart rate  64, respiratory rate 12.  HEENT:  Normal.  NECK:  Normal carotid upstrokes.  No bruits.  JVP normal.  LUNGS:  Clear bilaterally.  HEART:  Regular rate and rhythm.  There are no murmurs or gallops.  The  apex is discrete and nondisplaced.  ABDOMEN:  Soft, nontender.  No organomegaly.  No abdominal bruits.  BACK:  No CVA tenderness.  EXTREMITIES:  No clubbing, cyanosis, or edema.  Peripheral pulses intact  and equal.  SKIN:  Warm and dry without rash.  NEUROLOGIC:  Cranial nerves II through XII are intact.   EKG shows sinus rhythm and is within normal limits.   Labs reviewed and includes a lipid panel which shows a cholesterol of  201, triglycerides 73, HDL 60, and LDL 129.   ASSESSMENT:  This is a 76 year old woman with chest pain.  Her symptoms  have both typical and atypical features.  Predominately, they are  atypical as they occur at rest and mostly in the supine position.  However, I am concerned by the fact that she has some degree of symptoms  with exercise and exposure to cold weather.  I think it is prudent to  repeat a nuclear stress study to rule out significant ischemia.  If this  yields low-risk findings, I do not think further cardiac workup is  necessary.  No changes were made to the  patient's medical regimen today.  Further plans pending the result of her stress test.  If she has a  normal or low-risk stress test, we would consider esophageal spasm or  reflux-related chest pain is the most probable working diagnoses.   I appreciate the opportunity to see this very nice lady.     Veverly Fells. Excell Seltzer, MD  Electronically Signed    MDC/MedQ  DD: 06/17/2008  DT: 06/18/2008  Job #: [id number]   cc:   Marne A. Milinda Antis, MD

## 2010-07-24 NOTE — Assessment & Plan Note (Signed)
[facility]                              CARDIOLOGY OFFICE NOTE   [name], [name]                    MRN:          [id number]  DATE:10/28/2005                            DOB:          1934-09-30    CARDIOLOGY CONSULTATION:  Erika Evans is seen for cardiac evaluation.  The patient has had multiple  stresses over time.  Her husband passed away.  She has been trying to sell  her land and buy a new home and have an auction, and this has all been very  stressful for her.  She has had some episodes, primarily at rest, of chest  discomfort.  She can have some belching with this.  It does not sound like  classic angina, but it is concerning.  She has had some discomfort in her  arm.  Her EKG had shown decreased anterior R-waves with a question of a  septal infarct.  She is now here for further evaluation.   PAST MEDICAL HISTORY:   ALLERGIES:  The patient has had difficulties with Fosamax, Evista, and  contrast dye.   MEDICATIONS:  1. Advair 250/50 twice daily.  2. Prevacid 30.  3. Fexofenadine 180.  4. Fluticasone 50 mg.  5. Singulair 10 mg.   SOCIAL HISTORY:  The patient is retired.   FAMILY HISTORY:  There is no strong family history of coronary disease.   REVIEW OF SYSTEMS:  The patient has asthma.  She has had some mild LFT  abnormalities in the past.  She has had some urinary tract infections and  there is some GERD.  She has some mild arthritis.  She also has had some  skin cancer.  She knows that she is depressed, related to the passing of her  husband.  Otherwise, her review of systems is negative.   PHYSICAL EXAMINATION:  VITAL SIGNS:  Blood pressure today is 122/77.  Pulse  is 65.  GENERAL:  The patient is oriented to person, time and place.  Affect is  normal.  She knows that she is under a great deal of stress.  LUNGS:  Clear.  Respiratory effort is not labored.  HEENT:  Reveals no xanthelasma.  She has normal extraocular  motion.  NECK:  There are no carotid bruits.  There is no jugular venous distention.  CARDIAC EXAM:  Reveals an S1 and S2.  There are no clicks or significant  murmurs.  ABDOMEN:  Soft.  There are no masses or bruits.  EXTREMITIES:  There is no significant peripheral edema.  MUSCULOSKELETAL:  There are no major musculoskeletal deformities.   An EKG, sent to me, shows a slight decreased R-wave in V2.  Old septal  infarct cannot be ruled out.   The patient has some labs sent to me that are dated in 2006.  At that time,  renal function and hemoglobin were normal.  In August of 2006, her LDL was  136 with an HDL of 60 and triglycerides of 60.   PROBLEMS INCLUDE:  1. History of asthma, on medications.  2. GERD.  3. History of contrast dye allergy.  4. Significant life stresses that are going on at this time.  5. History of mild liver function abnormalities at one time in the past.  6. Some mild arthritis.  7. History of urinary tract infections in the past.  8. Situational depression.  9. History of skin cancers.  10.Chest pain with some radiation to the arm.  11.Mildly abnormal EKG.   At this point, the patient appears to be stable.  I do feel that further  cardiac workup is appropriate.  Because of the abnormal EKG, a 2D echo will  be done to be sure that she has normal LV function.  She also needs a stress  study.  This will be done with Myoview.  Because of her pulmonary  medications, I will see if she can walk on the treadmill.  If not, we will  use dobutamine.  She will go about normal activities.  I am not changing her  medicines.  I will then see her back for cardiology followup to reassess all  of this data.                               Luis Abed, MD, [facility]    JDK/MedQ  DD:  10/28/2005  DT:  10/28/2005  Job #:  [id number]   cc:   Marne A. Milinda Antis, MD

## 2010-07-24 NOTE — Discharge Summary (Signed)
NAME:  Erika Evans, Erika Evans                         ACCOUNT NO.:  [id number]   MEDICAL RECORD NO.:  [id number]                   PATIENT TYPE:  INP   LOCATION:  5008                                 FACILITY:  [facility]   PHYSICIAN:  Georgina Quint. Plotnikov, M.D. [facility]      DATE OF BIRTH:  1934-09-25   DATE OF ADMISSION:  08/08/2002  DATE OF DISCHARGE:  08/10/2002                                 DISCHARGE SUMMARY   DISCHARGE DIAGNOSES:  1. Bilateral pneumonia.  2. Hypoxemia due to the above resolved.  3. Slight liver test elevation improved.  4. Normocytic anemia, workup as an outpatient.   DISCHARGE MEDICATIONS:  1. Ceftin 500 mg twice daily for six days.  2. Zithromax 250 mg daily for two days.  3. __________ 4 mg twice daily for four weeks then as needed.  4. Mescolor one twice a day for two weeks then as needed.  5. Tussionex 5mL twice a day as needed for cough.  6. Resume other home meds.   Activity as tolerated.  Diet resume previous.   SPECIAL INSTRUCTIONS:  Call if problems.  Follow-up appointment Dr. Milinda Antis  next weeks.   HISTORY:  The patient is a 76 year old female who presented to the hospital  with 24 hours of fever with chills, dyspnea and nonproductive cough.  Further details addressed in history and physical.   HOSPITAL COURSE:  She was thought to have bilateral pneumonia with possible  early sepsis.  She was treated with intravenous antibiotics and neb  treatments with good results.  Her condition has slowly improved.  LFTs were  normalizing.  Hypoxemia has resolved.  On the day of discharge she was  feeling 100% better.  There was no chest pain.  She was still short of  breath with exertion has not required any oxygen.  She looked well.  Temperature 97.5.  Respirations 20, blood pressure 122/55.  HEENT with moist  mucosa, lungs with coarse breath sounds bilaterally (much better than  before).  Heart had S1, S2.  Abdomen was soft and tender.  Lower extremities  without  edema.  She was alert and cooperative.  Her chest x-ray today showed  slightly increased bibasilar opacities, right more than left cardiomegaly.  I feel well about discharging her since she is doing clinically so well.   LABORATORY DATA:  Gram stain of sputum, few gram positive cocci in pairs.  Rare gram positive rods, rare gram negative cocci bacilli.  Culture with  normal oropharyngeal flora.  White count 19,500 on admission, 11,000 on June  3.  Hemoglobin 12.6-10.9.  Neutrophils 88%.  INR 0.9, potassium 3.4-3.9,  creatinine normal.  Sodium normal.  AST 58-30, ALT 75-50.  CK is normal.  Urinalysis unremarkable.  Recent chest x-ray with __________ bibasilar  atelectasis.  Abdominal ultrasound normal except for some thinning of the  cortex of the upper pole of the right kidney, no evidence for gallbladder or  biliary disease.  EKG with  normal sinus rhythm, nonspecific T-wave  abnormalities.                                               Georgina Quint. Plotnikov, M.D. [facility]    AVP/MEDQ  D:  08/12/2002  T:  08/13/2002  Job:  [id number]   cc:   Marne A. Milinda Antis, M.D. [facility]

## 2010-07-24 NOTE — Assessment & Plan Note (Signed)
[facility]                              CARDIOLOGY OFFICE NOTE   SHERANDA, SEABROOKS                    MRN:          [id number]  DATE:11/29/2005                            DOB:          1934/08/20    Ms. Brierley is seen for Cardiology follow up.  See my complete note of October 28, 2005.  At that time we decided to proceed with a 2D echo.  This study  shows good LV function and no significant valvular abnormalities.  Patient  also had an exercise Myoview scan.  Her exercise tolerance is limited.  However, she had no scar or ischemia.   Today she is seen back in the office and she is doing well.  She had vague  chest discomfort on one occasion with exercise.   With all of the data, I believe that her cardiac status is stable.  I have  encouraged her to continue with the exercise and to remain fully active.  At  this point, there is no evidence of significant cardiac disease.   I have not made her a follow up appointment.  I will be happy to see her if  questions continue over time.  She has a resting EKG that is mildly  abnormal, but this is a false positive in that her echo study  shows  excellent LV function.   No further cardiac workup.                               Luis Abed, MD, [facility]    JDK/MedQ  DD:  11/29/2005  DT:  11/30/2005  Job #:  [id number]   cc:   Marne A. Milinda Antis, MD

## 2010-08-20 ENCOUNTER — Other Ambulatory Visit: Payer: Self-pay | Admitting: *Deleted

## 2010-08-20 MED ORDER — OMEPRAZOLE 20 MG PO CPDR: 20.0000 mg | DELAYED_RELEASE_CAPSULE | Freq: Every day | ORAL | Status: DC

## 2010-08-27 ENCOUNTER — Encounter: Payer: Self-pay | Admitting: Cardiovascular Disease

## 2010-09-10 ENCOUNTER — Other Ambulatory Visit: Payer: Self-pay | Admitting: Family Medicine

## 2010-09-10 DIAGNOSIS — Z1231 Encounter for screening mammogram for malignant neoplasm of breast: Secondary | ICD-10-CM

## 2010-09-30 ENCOUNTER — Ambulatory Visit
Admission: RE | Admit: 2010-09-30 | Discharge: 2010-09-30 | Disposition: A | Discharge status: Home / Self Care | Payer: BC Managed Care – PPO | Source: Ambulatory Visit | Attending: Family Medicine | Admitting: Family Medicine

## 2010-09-30 DIAGNOSIS — Z1231 Encounter for screening mammogram for malignant neoplasm of breast: Secondary | ICD-10-CM

## 2010-12-10 ENCOUNTER — Ambulatory Visit (INDEPENDENT_AMBULATORY_CARE_PROVIDER_SITE_OTHER): Payer: Medicare Other

## 2010-12-10 DIAGNOSIS — Z23 Encounter for immunization: Secondary | ICD-10-CM

## 2011-04-15 ENCOUNTER — Other Ambulatory Visit: Payer: Self-pay | Admitting: Family Medicine

## 2011-04-15 NOTE — Telephone Encounter (Signed)
[facility] requested refill Omeprazole 20 mg. Pt last seen 10/30/09.Please advise.

## 2011-04-16 NOTE — Telephone Encounter (Signed)
Jamie please schedule appt. Thank you.

## 2011-04-16 NOTE — Telephone Encounter (Signed)
Make appt please unless she is no longer a pt here Will refill electronically

## 2011-04-19 NOTE — Telephone Encounter (Signed)
Called, spoke with pt and set up a f/u appt

## 2011-04-21 ENCOUNTER — Encounter: Payer: Self-pay | Admitting: Family Medicine

## 2011-04-21 ENCOUNTER — Ambulatory Visit (INDEPENDENT_AMBULATORY_CARE_PROVIDER_SITE_OTHER): Payer: Medicare Other | Admitting: Family Medicine

## 2011-04-21 VITALS — BP 140/82 | HR 64 | Temp 97.7°F | Wt 149.0 lb

## 2011-04-21 DIAGNOSIS — M949 Disorder of cartilage, unspecified: Secondary | ICD-10-CM | POA: Diagnosis not present

## 2011-04-21 DIAGNOSIS — M899 Disorder of bone, unspecified: Secondary | ICD-10-CM | POA: Diagnosis not present

## 2011-04-21 DIAGNOSIS — K219 Gastro-esophageal reflux disease without esophagitis: Secondary | ICD-10-CM | POA: Diagnosis not present

## 2011-04-21 DIAGNOSIS — Z23 Encounter for immunization: Secondary | ICD-10-CM | POA: Diagnosis not present

## 2011-04-21 DIAGNOSIS — E785 Hyperlipidemia, unspecified: Secondary | ICD-10-CM | POA: Diagnosis not present

## 2011-04-21 DIAGNOSIS — M1990 Unspecified osteoarthritis, unspecified site: Secondary | ICD-10-CM | POA: Diagnosis not present

## 2011-04-21 DIAGNOSIS — M8580 Other specified disorders of bone density and structure, unspecified site: Secondary | ICD-10-CM

## 2011-04-21 DIAGNOSIS — E669 Obesity, unspecified: Secondary | ICD-10-CM | POA: Insufficient documentation

## 2011-04-21 DIAGNOSIS — E663 Overweight: Secondary | ICD-10-CM

## 2011-04-21 MED ORDER — OMEPRAZOLE 20 MG PO CPDR: 20.0000 mg | DELAYED_RELEASE_CAPSULE | Freq: Every day | ORAL | Status: DC

## 2011-04-21 NOTE — Assessment & Plan Note (Signed)
Doing well with prilosec Disc imp of wt loss and healthy diet Med refilled  Watching bmd

## 2011-04-21 NOTE — Patient Instructions (Addendum)
We will schedule bone density test at check out  Labs today  Follow up about a month after your dexa to review it and your labs  Avoid red meat/ fried foods/ egg yolks/ fatty breakfast meats/ butter, cheese and high fat dairy/ and shellfish   Work on weight loss - this will help your joints tetnus shot and pneumonia vaccine today  If you are interested in a shingles/zoster vaccine - call your insurance to check on coverage,( you should not get it within 1 month of other vaccines) , then call us for a prescription  for it to take to a pharmacy that gives the shot

## 2011-04-21 NOTE — Assessment & Plan Note (Signed)
sched dexa Pt stopped miacalcin when she ran out of it Rev ca and D D level today Rev result at f/u

## 2011-04-21 NOTE — Progress Notes (Signed)
Subjective:    Patient ID: Erika Evans, female    DOB: 1934/11/30, 77 y.o.   MRN: [id number]  HPI Here for f/u of chronic medical problems Other than arthritis is doing ok     bp is 140/82  Hx of high lipids Due for labs Lab Results  Component Value Date   CHOL 190 05/04/2010   HDL 58.20 05/04/2010   LDLCALC 103* 05/04/2010   LDLDIRECT 134.8 10/30/2009   TRIG 144.0 05/04/2010   CHOLHDL 3 05/04/2010   diet--was very bad for a while   Wt is up 17 lb with bmi of 29 Thinks it was from the holidays - recently started cutting back again (just started )  Now is helping clean up a house and that is helping  Eating the wrong food and not taking time to prepare   Has GERD- on prilosec- is in very good with that - cannot skip a day   Has OA- on was taking aleve Now is taking tylenol    Has osteopenia -no longer taking miacalcin - ran out of it and quit taking it  No fractures    Immunizations- had her flu shot  Needs pneumovax  Is due for Td too   Wants zostavax- has to check with her insurance   Sees Dr Saxman Callas for allergies/ asthma   Patient Active Problem List  Diagnoses  . GERD (gastroesophageal reflux disease)  . OA (osteoarthritis)  . Hyperlipidemia  . Osteopenia  . Overweight   Past Medical History  Diagnosis Date  . GERD (gastroesophageal reflux disease)   . Osteoarthritis   . Pneumonia 2004  . Foot pain     Left; nerve problem; procedure  . Hyperlipidemia     Mild  . Osteopenia   . Hepatitis B carrier     Positive hep B test at [facility]  . Elevated liver enzymes    Past Surgical History  Procedure Date  . Partial hysterectomy   . Back surgery   . "blocked kidney"   . Dexa     Osteopenis 9/01, improved 10/03, decreased BMD 5/06  . US echocardiography 7/03    Abd-normal  . Colonoscopy 9/06    Diverticulosis, hems  . US renal/aorta 2007    Neg  . Cardiovascular stress test 9/07    Neg   History  Substance Use Topics  . Smoking status:  Never Smoker   . Smokeless tobacco: Not on file  . Alcohol Use: Not on file   Family History  Problem Relation Age of Onset  . Lung cancer Father   . Cancer Sister     Bladder  . Heart attack Brother   . Leukemia Brother   . Stroke Brother    Allergies  Allergen Reactions  . Alendronate Sodium     REACTION: acid reflux  . Raloxifene     REACTION: severe muscle pain and weakness   Current Outpatient Prescriptions on File Prior to Visit  Medication Sig Dispense Refill  . aspirin 81 MG tablet Take 81 mg by mouth daily.        . Cholecalciferol 2000 UNITS TABS Take 1 tablet by mouth daily.        . fexofenadine (ALLEGRA) 180 MG tablet Take 180 mg by mouth daily.        . fish oil-omega-3 fatty acids 1000 MG capsule Take 1 g by mouth 3 (three) times daily.        . Fluticasone-Salmeterol (ADVAIR DISKUS) 250-50 MCG/DOSE  AEPB Inhale 1 puff into the lungs every 12 (twelve) hours.        . Glucosamine-Chondroit-Vit C-Mn (GLUCOSAMINE 1500 COMPLEX PO) Take by mouth. Take as directed.       . montelukast (SINGULAIR) 10 MG tablet Take 10 mg by mouth daily.            Review of Systems Review of Systems  Constitutional: Negative for fever, appetite change, fatigue and unexpected weight change.  Eyes: Negative for pain and visual disturbance.  Respiratory: Negative for cough and shortness of breath.   Cardiovascular: Negative for cp or palpitations    Gastrointestinal: Negative for nausea, diarrhea and constipation.  Genitourinary: Negative for urgency and frequency.  Skin: Negative for pallor or rash   Neurological: Negative for weakness, light-headedness, numbness and headaches.  Hematological: Negative for adenopathy. Does not bruise/bleed easily.  Psychiatric/Behavioral: Negative for dysphoric mood. The patient is not nervous/anxious.         Objective:   Physical Exam  Constitutional: She appears well-developed and well-nourished. No distress.       overwt and well appearing     HENT:  Head: Normocephalic and atraumatic.  Right Ear: External ear normal.  Left Ear: External ear normal.  Mouth/Throat: Oropharynx is clear and moist.  Eyes: Conjunctivae and EOM are normal. Pupils are equal, round, and reactive to light. No scleral icterus.  Neck: Normal range of motion. Neck supple. No JVD present. Carotid bruit is not present. No thyromegaly present.  Cardiovascular: Normal rate, regular rhythm, normal heart sounds and intact distal pulses.  Exam reveals no gallop.   Pulmonary/Chest: Effort normal and breath sounds normal. No respiratory distress. She has no wheezes. She exhibits no tenderness.  Abdominal: Soft. Bowel sounds are normal. She exhibits no distension, no abdominal bruit and no mass. There is no tenderness.  Musculoskeletal: She exhibits no edema and no tenderness.  Lymphadenopathy:    She has no cervical adenopathy.  Neurological: She is alert. She has normal reflexes. No cranial nerve deficit. She exhibits normal muscle tone. Coordination normal.  Skin: Skin is warm and dry. No rash noted. No erythema. No pallor.  Psychiatric: She has a normal mood and affect.          Assessment & Plan:

## 2011-04-21 NOTE — Assessment & Plan Note (Signed)
Significant wt gain 17 lb Discussed how this problem influences overall health and the risks it imposes  Reviewed plan for weight loss with lower calorie diet (via better food choices and also portion control or program like weight watchers) and exercise building up to or more than 30 minutes 5 days per week including some aerobic activity

## 2011-04-21 NOTE — Assessment & Plan Note (Signed)
Lab today Much worse diet  Rev low sat fat diet  Will disc at f/u

## 2011-04-22 LAB — CBC WITH DIFFERENTIAL/PLATELET
Basophils Relative: 0.1 % (ref 0.0–3.0)
Eosinophils Relative: 3.2 % (ref 0.0–5.0)
Hemoglobin: 12.8 g/dL (ref 12.0–15.0)
Lymphocytes Relative: 46.9 % — ABNORMAL HIGH (ref 12.0–46.0)
MCHC: 32.5 g/dL (ref 30.0–36.0)
Monocytes Relative: 9.3 % (ref 3.0–12.0)
Neutro Abs: 3.4 10*3/uL (ref 1.4–7.7)
Neutrophils Relative %: 40.5 % — ABNORMAL LOW (ref 43.0–77.0)
RBC: 4.27 Mil/uL (ref 3.87–5.11)
WBC: 8.4 10*3/uL (ref 4.5–10.5)

## 2011-04-22 LAB — COMPREHENSIVE METABOLIC PANEL
AST: 25 U/L (ref 0–37)
Albumin: 3.9 g/dL (ref 3.5–5.2)
BUN: 21 mg/dL (ref 6–23)
CO2: 31 mEq/L (ref 19–32)
Calcium: 9.6 mg/dL (ref 8.4–10.5)
Chloride: 105 mEq/L (ref 96–112)
Creatinine, Ser: 0.7 mg/dL (ref 0.4–1.2)
GFR: 86.76 mL/min (ref >60.00)
Glucose, Bld: 80 mg/dL (ref 70–99)
Potassium: 4.5 mEq/L (ref 3.5–5.1)

## 2011-04-22 LAB — LIPID PANEL
Cholesterol: 232 mg/dL — ABNORMAL HIGH (ref 0–200)
HDL: 72.5 mg/dL (ref >39.00)
Triglycerides: 64 mg/dL (ref 0.0–149.0)

## 2011-04-22 LAB — VITAMIN D 25 HYDROXY (VIT D DEFICIENCY, FRACTURES): Vit D, 25-Hydroxy: 44 ng/mL (ref 30–89)

## 2011-04-26 ENCOUNTER — Other Ambulatory Visit: Payer: Medicare Other

## 2011-04-28 ENCOUNTER — Ambulatory Visit (INDEPENDENT_AMBULATORY_CARE_PROVIDER_SITE_OTHER)
Admission: RE | Admit: 2011-04-28 | Discharge: 2011-04-28 | Disposition: A | Discharge status: Home / Self Care | Payer: Medicare Other | Source: Ambulatory Visit | Attending: Family Medicine | Admitting: Family Medicine

## 2011-04-28 ENCOUNTER — Other Ambulatory Visit (HOSPITAL_COMMUNITY): Payer: Medicare Other

## 2011-04-28 DIAGNOSIS — M899 Disorder of bone, unspecified: Secondary | ICD-10-CM | POA: Diagnosis not present

## 2011-04-28 DIAGNOSIS — M8580 Other specified disorders of bone density and structure, unspecified site: Secondary | ICD-10-CM

## 2011-04-28 DIAGNOSIS — M949 Disorder of cartilage, unspecified: Secondary | ICD-10-CM | POA: Diagnosis not present

## 2011-05-05 ENCOUNTER — Encounter: Payer: Self-pay | Admitting: Family Medicine

## 2011-05-11 ENCOUNTER — Encounter: Payer: Self-pay | Admitting: Family Medicine

## 2011-05-20 DIAGNOSIS — H1045 Other chronic allergic conjunctivitis: Secondary | ICD-10-CM | POA: Diagnosis not present

## 2011-05-20 DIAGNOSIS — J309 Allergic rhinitis, unspecified: Secondary | ICD-10-CM | POA: Diagnosis not present

## 2011-05-20 DIAGNOSIS — J45909 Unspecified asthma, uncomplicated: Secondary | ICD-10-CM | POA: Diagnosis not present

## 2011-05-26 ENCOUNTER — Ambulatory Visit: Payer: Medicare Other | Admitting: Family Medicine

## 2011-05-27 ENCOUNTER — Ambulatory Visit: Payer: Medicare Other | Admitting: Family Medicine

## 2011-06-01 ENCOUNTER — Encounter: Payer: Self-pay | Admitting: Family Medicine

## 2011-06-01 ENCOUNTER — Ambulatory Visit (INDEPENDENT_AMBULATORY_CARE_PROVIDER_SITE_OTHER): Payer: Medicare Other | Admitting: Family Medicine

## 2011-06-01 VITALS — BP 124/72 | HR 62 | Temp 98.0°F | Ht 60.5 in | Wt 146.5 lb

## 2011-06-01 DIAGNOSIS — M899 Disorder of bone, unspecified: Secondary | ICD-10-CM

## 2011-06-01 DIAGNOSIS — E663 Overweight: Secondary | ICD-10-CM | POA: Diagnosis not present

## 2011-06-01 DIAGNOSIS — M8580 Other specified disorders of bone density and structure, unspecified site: Secondary | ICD-10-CM

## 2011-06-01 DIAGNOSIS — E785 Hyperlipidemia, unspecified: Secondary | ICD-10-CM

## 2011-06-01 NOTE — Patient Instructions (Addendum)
Try to fit in exercise 5 days per week - 30 minutes (indoors or out) Avoid red meat/ fried foods/ egg yolks/ fatty breakfast meats/ butter, cheese and high fat dairy/ and shellfish   Continue calcium nad vitamin D Schedule fasting lab and then follow up in 6 months for cholesterol

## 2011-06-01 NOTE — Assessment & Plan Note (Signed)
Commended on wt loss so far with dec in calories and inc exercise Urged her to keep it up

## 2011-06-01 NOTE — Progress Notes (Signed)
Subjective:    Patient ID: Erika Evans, female    DOB: 07/23/1934, 77 y.o.   MRN: [id number]  HPI Here for f/u of lipids and obesity and osteopenia Has been feeling ok   bp good   Wt is down 3 lb with bmi of 28  Is finally starting to loose Is not eating as much - is happy with that  Wants to exercise when the weather warms up - refuses to exercise at home but does clean and paint   Lab Results  Component Value Date   CHOL 232* 04/21/2011   CHOL 190 05/04/2010   CHOL 216* 10/30/2009   Lab Results  Component Value Date   HDL 72.50 04/21/2011   HDL 86.57 05/04/2010   HDL 84.69 10/30/2009   Lab Results  Component Value Date   LDLCALC 103* 05/04/2010   LDLCALC 116* 05/25/2007   Lab Results  Component Value Date   TRIG 64.0 04/21/2011   TRIG 144.0 05/04/2010   TRIG 90.0 10/30/2009   Lab Results  Component Value Date   CHOLHDL 3 04/21/2011   CHOLHDL 3 05/04/2010   CHOLHDL 4 10/30/2009   Lab Results  Component Value Date   LDLDIRECT 144.2 04/21/2011   LDLDIRECT 134.8 10/30/2009   LDLDIRECT 129.0 06/12/2008   diet - is already a lot better than it was when we checked it  When she is home- eats healthy    Is taking ca and D for her osteopenia  T score -1.4 LS and -1.7 in L FN  No fractures  Could not tolerate fosamax and evista Also started getting almond milk - which is supposed to have more calcium  Patient Active Problem List  Diagnoses  . GERD (gastroesophageal reflux disease)  . OA (osteoarthritis)  . Hyperlipidemia  . Osteopenia  . Overweight   Past Medical History  Diagnosis Date  . GERD (gastroesophageal reflux disease)   . Osteoarthritis   . Pneumonia 2004  . Foot pain     Left; nerve problem; procedure  . Hyperlipidemia     Mild  . Osteopenia   . Hepatitis B carrier     Positive hep B test at [facility]  . Elevated liver enzymes    Past Surgical History  Procedure Date  . Partial hysterectomy   . Back surgery   . "blocked kidney"   . Dexa    Osteopenis 9/01, improved 10/03, decreased BMD 5/06  . US echocardiography 7/03    Abd-normal  . Colonoscopy 9/06    Diverticulosis, hems  . US renal/aorta 2007    Neg  . Cardiovascular stress test 9/07    Neg   History  Substance Use Topics  . Smoking status: Never Smoker   . Smokeless tobacco: Not on file  . Alcohol Use: Not on file   Family History  Problem Relation Age of Onset  . Lung cancer Father   . Cancer Sister     Bladder  . Heart attack Brother   . Leukemia Brother   . Stroke Brother    Allergies  Allergen Reactions  . Alendronate Sodium     REACTION: acid reflux  . Raloxifene     REACTION: severe muscle pain and weakness   Current Outpatient Prescriptions on File Prior to Visit  Medication Sig Dispense Refill  . acetaminophen (PAIN RELIEF) 500 MG tablet Take 500 mg by mouth every 6 (six) hours as needed.      Marland Kitchen aspirin 81 MG tablet Take 81  mg by mouth daily.        . Cholecalciferol 2000 UNITS TABS Take 1 tablet by mouth daily.        . fexofenadine (ALLEGRA) 180 MG tablet Take 180 mg by mouth daily.        . fish oil-omega-3 fatty acids 1000 MG capsule Take 1 g by mouth 3 (three) times daily.        . Fluticasone-Salmeterol (ADVAIR DISKUS) 250-50 MCG/DOSE AEPB Inhale 1 puff into the lungs every 12 (twelve) hours.        . Glucosamine-Chondroit-Vit C-Mn (GLUCOSAMINE 1500 COMPLEX PO) Take by mouth. Take as directed.       . montelukast (SINGULAIR) 10 MG tablet Take 10 mg by mouth daily.        Marland Kitchen omeprazole (PRILOSEC) 20 MG capsule Take 1 capsule (20 mg total) by mouth daily.  90 capsule  3          Review of Systems Review of Systems  Constitutional: Negative for fever, appetite change, fatigue and unexpected weight change.  Eyes: Negative for pain and visual disturbance.  Respiratory: Negative for cough and shortness of breath.   Cardiovascular: Negative for cp or palpitations    Gastrointestinal: Negative for nausea, diarrhea and constipation.    Genitourinary: Negative for urgency and frequency.  Skin: Negative for pallor or rash   Neurological: Negative for weakness, light-headedness, numbness and headaches.  Hematological: Negative for adenopathy. Does not bruise/bleed easily.  Psychiatric/Behavioral: Negative for dysphoric mood. The patient is not nervous/anxious.          Objective:   Physical Exam  Constitutional: She appears well-developed and well-nourished. No distress.       overwt and well appearing   HENT:  Head: Normocephalic and atraumatic.  Mouth/Throat: Oropharynx is clear and moist.  Eyes: Conjunctivae and EOM are normal. Pupils are equal, round, and reactive to light. No scleral icterus.  Neck: Normal range of motion. Neck supple. No JVD present. Carotid bruit is not present. No thyromegaly present.  Cardiovascular: Normal rate, regular rhythm, normal heart sounds and intact distal pulses.  Exam reveals no gallop.   Pulmonary/Chest: Effort normal and breath sounds normal. No respiratory distress. She has no wheezes.  Abdominal: Soft. Bowel sounds are normal. She exhibits no distension, no abdominal bruit and no mass. There is no tenderness.  Musculoskeletal: She exhibits no edema.       No kyphosis   Lymphadenopathy:    She has no cervical adenopathy.  Neurological: She is alert. She has normal reflexes. No cranial nerve deficit. She exhibits normal muscle tone. Coordination normal.  Skin: Skin is warm and dry. No rash noted. No erythema. No pallor.  Psychiatric: She has a normal mood and affect.          Assessment & Plan:

## 2011-06-01 NOTE — Assessment & Plan Note (Signed)
This is moderate- no worse  Intol of fosamax/ evista  Is eating ca and D and staying active  No fractures For now will hold off on addn tx and monitor- with dexa every 2 years

## 2011-06-01 NOTE — Assessment & Plan Note (Signed)
Cholesterol was up as expected- but not as high as I thought it would be HDL is up due to inc in activity Disc goals for lipids and reasons to control them Rev labs with pt Rev low sat fat diet in detail  Would like LDL under 130 Pt will work on diet , then re check this in 6 mo and review at f/u  Consider statin if no progress Is also starting to loose weight

## 2011-07-20 DIAGNOSIS — L821 Other seborrheic keratosis: Secondary | ICD-10-CM | POA: Diagnosis not present

## 2011-07-20 DIAGNOSIS — D239 Other benign neoplasm of skin, unspecified: Secondary | ICD-10-CM | POA: Diagnosis not present

## 2011-08-10 DIAGNOSIS — H2510 Age-related nuclear cataract, unspecified eye: Secondary | ICD-10-CM | POA: Diagnosis not present

## 2011-08-10 DIAGNOSIS — H524 Presbyopia: Secondary | ICD-10-CM | POA: Diagnosis not present

## 2011-11-19 ENCOUNTER — Other Ambulatory Visit: Payer: Self-pay | Admitting: Family Medicine

## 2011-11-19 DIAGNOSIS — Z1231 Encounter for screening mammogram for malignant neoplasm of breast: Secondary | ICD-10-CM

## 2011-11-25 ENCOUNTER — Other Ambulatory Visit (INDEPENDENT_AMBULATORY_CARE_PROVIDER_SITE_OTHER): Payer: Medicare Other

## 2011-11-25 DIAGNOSIS — E785 Hyperlipidemia, unspecified: Secondary | ICD-10-CM | POA: Diagnosis not present

## 2011-11-25 LAB — LIPID PANEL
Total CHOL/HDL Ratio: 4
Triglycerides: 74 mg/dL (ref 0.0–149.0)
VLDL: 14.8 mg/dL (ref 0.0–40.0)

## 2011-11-25 LAB — ALT: ALT: 22 U/L (ref 0–35)

## 2011-11-25 LAB — AST: AST: 25 U/L (ref 0–37)

## 2011-11-25 LAB — LDL CHOLESTEROL, DIRECT: Direct LDL: 146.1 mg/dL

## 2011-12-03 ENCOUNTER — Encounter: Payer: Self-pay | Admitting: Family Medicine

## 2011-12-03 ENCOUNTER — Ambulatory Visit (INDEPENDENT_AMBULATORY_CARE_PROVIDER_SITE_OTHER): Payer: Medicare Other | Admitting: Family Medicine

## 2011-12-03 VITALS — BP 122/68 | HR 74 | Temp 98.1°F | Ht 60.25 in | Wt 146.5 lb

## 2011-12-03 DIAGNOSIS — E785 Hyperlipidemia, unspecified: Secondary | ICD-10-CM | POA: Diagnosis not present

## 2011-12-03 DIAGNOSIS — Z23 Encounter for immunization: Secondary | ICD-10-CM

## 2011-12-03 NOTE — Progress Notes (Signed)
Subjective:    Patient ID: Erika Evans, female    DOB: 1934-03-18, 77 y.o.   MRN: [id number]  HPI Here for cholesterol visit   Started watching her diet just for the past few days   Lab Results  Component Value Date   CHOL 233* 11/25/2011   CHOL 232* 04/21/2011   CHOL 190 05/04/2010   Lab Results  Component Value Date   HDL 59.50 11/25/2011   HDL 82.95 04/21/2011   HDL 62.13 05/04/2010   Lab Results  Component Value Date   LDLCALC 103* 05/04/2010   LDLCALC 116* 05/25/2007   Lab Results  Component Value Date   TRIG 74.0 11/25/2011   TRIG 64.0 04/21/2011   TRIG 144.0 05/04/2010   Lab Results  Component Value Date   CHOLHDL 4 11/25/2011   CHOLHDL 3 04/21/2011   CHOLHDL 3 05/04/2010   Lab Results  Component Value Date   LDLDIRECT 146.1 11/25/2011   LDLDIRECT 144.2 04/21/2011   LDLDIRECT 134.8 10/30/2009   not walking as regularly  No different from last time for the most part  Has been eating mac and cheese and cheese in general, also polish sausage  Trying to eat broiled chicken and fish mostly  Has been really busy No fried foods  No butter, occ margarine Seldom eggs   Had had weddings and also events - with food   Wants to try another 6 months  Before disc med   Patient Active Problem List  Diagnosis  . GERD (gastroesophageal reflux disease)  . OA (osteoarthritis)  . Hyperlipidemia  . Osteopenia  . Overweight   Past Medical History  Diagnosis Date  . GERD (gastroesophageal reflux disease)   . Osteoarthritis   . Pneumonia 2004  . Foot pain     Left; nerve problem; procedure  . Hyperlipidemia     Mild  . Osteopenia   . Hepatitis B carrier     Positive hep B test at [facility]  . Elevated liver enzymes    Past Surgical History  Procedure Date  . Partial hysterectomy   . Back surgery   . "blocked kidney"   . Dexa     Osteopenis 9/01, improved 10/03, decreased BMD 5/06  . US echocardiography 7/03    Abd-normal  . Colonoscopy 9/06   Diverticulosis, hems  . US renal/aorta 2007    Neg  . Cardiovascular stress test 9/07    Neg   History  Substance Use Topics  . Smoking status: Never Smoker   . Smokeless tobacco: Not on file  . Alcohol Use: No   Family History  Problem Relation Age of Onset  . Lung cancer Father   . Cancer Sister     Bladder  . Heart attack Brother   . Leukemia Brother   . Stroke Brother    Allergies  Allergen Reactions  . Alendronate Sodium     REACTION: acid reflux  . Raloxifene     REACTION: severe muscle pain and weakness   Current Outpatient Prescriptions on File Prior to Visit  Medication Sig Dispense Refill  . acetaminophen (PAIN RELIEF) 500 MG tablet Take 500 mg by mouth every 6 (six) hours as needed.      Marland Kitchen aspirin 81 MG tablet Take 81 mg by mouth daily.        . Bepotastine Besilate (BEPREVE) 1.5 % SOLN Place 1 drop into both eyes as needed.      . Calcium Carbonate-Vitamin D (CALCIUM 600 + D  PO) Take 1 tablet by mouth 2 (two) times daily.      . Cholecalciferol 2000 UNITS TABS Take 1 tablet by mouth daily.        . fish oil-omega-3 fatty acids 1000 MG capsule Take 1 g by mouth 2 (two) times daily.       . fluticasone (FLONASE) 50 MCG/ACT nasal spray Place 2 sprays into the nose daily.      . Fluticasone-Salmeterol (ADVAIR DISKUS) 250-50 MCG/DOSE AEPB Inhale 1 puff into the lungs every 12 (twelve) hours.        . Glucosamine-Chondroit-Vit C-Mn (GLUCOSAMINE 1500 COMPLEX PO) Take by mouth 2 (two) times daily. Take as directed.      Marland Kitchen levocetirizine (XYZAL) 5 MG tablet Take 5 mg by mouth at bedtime.      . montelukast (SINGULAIR) 10 MG tablet Take 10 mg by mouth daily.        Marland Kitchen omeprazole (PRILOSEC) 20 MG capsule Take 1 capsule (20 mg total) by mouth daily.  90 capsule  3  . fexofenadine (ALLEGRA) 180 MG tablet Take 180 mg by mouth daily.           Review of Systems Review of Systems  Constitutional: Negative for fever, appetite change, fatigue and unexpected weight change.    Eyes: Negative for pain and visual disturbance.  Respiratory: Negative for cough and shortness of breath.   Cardiovascular: Negative for cp or palpitations    Gastrointestinal: Negative for nausea, diarrhea and constipation.  Genitourinary: Negative for urgency and frequency.  Skin: Negative for pallor or rash   Neurological: Negative for weakness, light-headedness, numbness and headaches.  Hematological: Negative for adenopathy. Does not bruise/bleed easily.  Psychiatric/Behavioral: Negative for dysphoric mood. The patient is not nervous/anxious.         Objective:   Physical Exam  Constitutional: She appears well-developed and well-nourished. No distress.       obese and well appearing   HENT:  Head: Normocephalic and atraumatic.  Mouth/Throat: Oropharynx is clear and moist.  Eyes: Conjunctivae normal and EOM are normal. Pupils are equal, round, and reactive to light. Right eye exhibits no discharge. Left eye exhibits no discharge. No scleral icterus.  Neck: Normal range of motion. Neck supple. No JVD present. Carotid bruit is not present. No thyromegaly present.  Cardiovascular: Normal rate, regular rhythm, normal heart sounds and intact distal pulses.  Exam reveals no gallop.   Pulmonary/Chest: Effort normal and breath sounds normal. No respiratory distress. She has no wheezes.  Abdominal: Soft. Bowel sounds are normal. She exhibits no abdominal bruit.  Musculoskeletal: She exhibits no edema.  Lymphadenopathy:    She has no cervical adenopathy.  Skin: Skin is warm and dry.  Psychiatric: She has a normal mood and affect.          Assessment & Plan:

## 2011-12-03 NOTE — Patient Instructions (Addendum)
Cholesterol is about the same  We want to increase the good cholesterol (HDL) with exercise and decrease the bad cholesterol (LDL) with low sat fat diet Avoid red meat/ fried foods/ egg yolks/ fatty breakfast meats/ butter, cheese and high fat dairy/ and shellfish    Flu shot today Schedule fasting lab and follow up in 6 months Cholesterol Control Diet Cholesterol levels in your body are determined significantly by your diet. Cholesterol levels may also be related to heart disease. The following material helps to explain this relationship and discusses what you can do to help keep your heart healthy. Not all cholesterol is bad. Low-density lipoprotein (LDL) cholesterol is the "bad" cholesterol. It may cause fatty deposits to build up inside your arteries. High-density lipoprotein (HDL) cholesterol is "good." It helps to remove the "bad" LDL cholesterol from your blood. Cholesterol is a very important risk factor for heart disease. Other risk factors are high blood pressure, smoking, stress, heredity, and weight. The heart muscle gets its supply of blood through the coronary arteries. If your LDL cholesterol is high and your HDL cholesterol is low, you are at risk for having fatty deposits build up in your coronary arteries. This leaves less room through which blood can flow. Without sufficient blood and oxygen, the heart muscle cannot function properly and you may feel chest pains (angina pectoris). When a coronary artery closes up entirely, a part of the heart muscle may die, causing a heart attack (myocardial infarction). CHECKING CHOLESTEROL When your caregiver sends your blood to a lab to be analyzed for cholesterol, a complete lipid (fat) profile may be done. With this test, the total amount of cholesterol and levels of LDL and HDL are determined. Triglycerides are a type of fat that circulates in the blood and can also be used to determine heart disease risk. The list below describes what the numbers  should be: Test: Total Cholesterol.  Less than 200 mg/dl.  Test: LDL "bad cholesterol."  Less than 100 mg/dl.   Less than 70 mg/dl if you are at very high risk of a heart attack or sudden cardiac death.  Test: HDL "good cholesterol."  Greater than 50 mg/dl for women.   Greater than 40 mg/dl for men.  Test: Triglycerides.  Less than 150 mg/dl.  CONTROLLING CHOLESTEROL WITH DIET Although exercise and lifestyle factors are important, your diet is key. That is because certain foods are known to raise cholesterol and others to lower it. The goal is to balance foods for their effect on cholesterol and more importantly, to replace saturated and trans fat with other types of fat, such as monounsaturated fat, polyunsaturated fat, and omega-3 fatty acids. On average, a person should consume no more than 15 to 17 g of saturated fat daily. Saturated and trans fats are considered "bad" fats, and they will raise LDL cholesterol. Saturated fats are primarily found in animal products such as meats, butter, and cream. However, that does not mean you need to sacrifice all your favorite foods. Today, there are good tasting, low-fat, low-cholesterol substitutes for most of the things you like to eat. Choose low-fat or nonfat alternatives. Choose round or loin cuts of red meat, since these types of cuts are lowest in fat and cholesterol. Chicken (without the skin), fish, veal, and ground [place] breast are excellent choices. Eliminate fatty meats, such as hot dogs and salami. Even shellfish have little or no saturated fat. Have a 3 oz (85 g) portion when you eat lean meat, poultry, or  fish. Trans fats are also called "partially hydrogenated oils." They are oils that have been scientifically manipulated so that they are solid at room temperature resulting in a longer shelf life and improved taste and texture of foods in which they are added. Trans fats are found in stick margarine, some tub margarines, cookies,  crackers, and baked goods.   When baking and cooking, oils are an excellent substitute for butter. The monounsaturated oils are especially beneficial since it is believed they lower LDL and raise HDL. The oils you should avoid entirely are saturated tropical oils, such as coconut and palm.   Remember to eat liberally from food groups that are naturally free of saturated and trans fat, including fish, fruit, vegetables, beans, grains (barley, rice, couscous, bulgur wheat), and pasta (without cream sauces).   IDENTIFYING FOODS THAT LOWER CHOLESTEROL   Soluble fiber may lower your cholesterol. This type of fiber is found in fruits such as apples, vegetables such as broccoli, potatoes, and carrots, legumes such as beans, peas, and lentils, and grains such as barley. Foods fortified with plant sterols (phytosterol) may also lower cholesterol. You should eat at least 2 g per day of these foods for a cholesterol lowering effect.   Read package labels to identify low-saturated fats, trans fats free, and low-fat foods at the supermarket. Select cheeses that have only 2 to 3 g saturated fat per ounce. Use a heart-healthy tub margarine that is free of trans fats or partially hydrogenated oil. When buying baked goods (cookies, crackers), avoid partially hydrogenated oils. Breads and muffins should be made from whole grains (whole-wheat or whole oat flour, instead of "flour" or "enriched flour"). Buy non-creamy canned soups with reduced salt and no added fats.   FOOD PREPARATION TECHNIQUES   Never deep-fry. If you must fry, either stir-fry, which uses very little fat, or use non-stick cooking sprays. When possible, broil, bake, or roast meats, and steam vegetables. Instead of dressing vegetables with butter or margarine, use lemon and herbs, applesauce and cinnamon (for squash and sweet potatoes), nonfat yogurt, salsa, and low-fat dressings for salads.   LOW-SATURATED FAT / LOW-FAT FOOD SUBSTITUTES Meats / Saturated Fat  (g)  Avoid: Steak, marbled (3 oz/85 g) / 11 g   Choose: Steak, lean (3 oz/85 g) / 4 g   Avoid: Hamburger (3 oz/85 g) / 7 g   Choose: Hamburger, lean (3 oz/85 g) / 5 g   Avoid: Ham (3 oz/85 g) / 6 g   Choose: Ham, lean cut (3 oz/85 g) / 2.4 g   Avoid: Chicken, with skin, dark meat (3 oz/85 g) / 4 g   Choose: Chicken, skin removed, dark meat (3 oz/85 g) / 2 g   Avoid: Chicken, with skin, light meat (3 oz/85 g) / 2.5 g   Choose: Chicken, skin removed, light meat (3 oz/85 g) / 1 g  Dairy / Saturated Fat (g)  Avoid: Whole milk (1 cup) / 5 g   Choose: Low-fat milk, 2% (1 cup) / 3 g   Choose: Low-fat milk, 1% (1 cup) / 1.5 g   Choose: Skim milk (1 cup) / 0.3 g   Avoid: Hard cheese (1 oz/28 g) / 6 g   Choose: Skim milk cheese (1 oz/28 g) / 2 to 3 g   Avoid: Cottage cheese, 4% fat (1 cup) / 6.5 g   Choose: Low-fat cottage cheese, 1% fat (1 cup) / 1.5 g   Avoid: Ice cream (1 cup) / 9 g  Choose: Sherbet (1 cup) / 2.5 g   Choose: Nonfat frozen yogurt (1 cup) / 0.3 g   Choose: Frozen fruit bar / trace   Avoid: Whipped cream (1 tbs) / 3.5 g   Choose: Nondairy whipped topping (1 tbs) / 1 g  Condiments / Saturated Fat (g)  Avoid: Mayonnaise (1 tbs) / 2 g   Choose: Low-fat mayonnaise (1 tbs) / 1 g   Avoid: Butter (1 tbs) / 7 g   Choose: Extra light margarine (1 tbs) / 1 g   Avoid: Coconut oil (1 tbs) / 11.8 g   Choose: Olive oil (1 tbs) / 1.8 g   Choose: Corn oil (1 tbs) / 1.7 g   Choose: Safflower oil (1 tbs) / 1.2 g   Choose: Sunflower oil (1 tbs) / 1.4 g   Choose: Soybean oil (1 tbs) / 2.4 g   Choose: Canola oil (1 tbs) / 1 g  Document Released: 02/22/2005 Document Revised: 02/11/2011 Document Reviewed: 08/13/2010 [facility] Patient Information 2012 [place], [place].

## 2011-12-03 NOTE — Assessment & Plan Note (Signed)
Lipids are unchanged- but pt did not make much of a lifestyle effort yet She is ready to start now Disc imp of both diet and exercise, rev low sat fat diet to red risk of vasc dz Given handout Re check 6 mo

## 2011-12-20 ENCOUNTER — Ambulatory Visit
Admission: RE | Admit: 2011-12-20 | Discharge: 2011-12-20 | Disposition: A | Discharge status: Home / Self Care | Payer: Medicare Other | Source: Ambulatory Visit | Attending: Family Medicine | Admitting: Family Medicine

## 2011-12-20 DIAGNOSIS — Z1231 Encounter for screening mammogram for malignant neoplasm of breast: Secondary | ICD-10-CM

## 2011-12-21 ENCOUNTER — Encounter: Payer: Self-pay | Admitting: *Deleted

## 2011-12-30 DIAGNOSIS — H1045 Other chronic allergic conjunctivitis: Secondary | ICD-10-CM | POA: Diagnosis not present

## 2011-12-30 DIAGNOSIS — J45909 Unspecified asthma, uncomplicated: Secondary | ICD-10-CM | POA: Diagnosis not present

## 2011-12-30 DIAGNOSIS — J309 Allergic rhinitis, unspecified: Secondary | ICD-10-CM | POA: Diagnosis not present

## 2012-05-26 ENCOUNTER — Other Ambulatory Visit (INDEPENDENT_AMBULATORY_CARE_PROVIDER_SITE_OTHER): Payer: Medicare Other

## 2012-05-26 DIAGNOSIS — E785 Hyperlipidemia, unspecified: Secondary | ICD-10-CM

## 2012-05-26 LAB — LIPID PANEL
Cholesterol: 198 mg/dL (ref 0–200)
HDL: 52.4 mg/dL (ref >39.00)
VLDL: 19.6 mg/dL (ref 0.0–40.0)

## 2012-06-02 ENCOUNTER — Encounter: Payer: Self-pay | Admitting: Family Medicine

## 2012-06-02 ENCOUNTER — Ambulatory Visit (INDEPENDENT_AMBULATORY_CARE_PROVIDER_SITE_OTHER): Payer: Medicare Other | Admitting: Family Medicine

## 2012-06-02 VITALS — BP 132/82 | HR 74 | Temp 97.7°F | Ht 60.25 in | Wt 145.5 lb

## 2012-06-02 DIAGNOSIS — E785 Hyperlipidemia, unspecified: Secondary | ICD-10-CM | POA: Diagnosis not present

## 2012-06-02 NOTE — Assessment & Plan Note (Signed)
This has improved with better diet overall- with LDL decrease of 20 points Disc goals for lipids and reasons to control them Rev labs with pt Rev low sat fat diet in detail  Will try to eat eggs without yolks  F/u6 mo for annual exam

## 2012-06-02 NOTE — Progress Notes (Signed)
Subjective:    Patient ID: Erika Evans, female    DOB: 04-09-1934, 78 y.o.   MRN: [id number]  HPI Here for f/u of hyperlipidemia  Is feeling well overall   bp is good    Wt is stable   Last visit was starting to watch diet   Lab Results  Component Value Date   CHOL 198 05/26/2012   CHOL 233* 11/25/2011   CHOL 232* 04/21/2011   Lab Results  Component Value Date   HDL 52.40 05/26/2012   HDL 40.98 11/25/2011   HDL 11.91 04/21/2011   Lab Results  Component Value Date   LDLCALC 126* 05/26/2012   LDLCALC 103* 05/04/2010   LDLCALC 116* 05/25/2007   Lab Results  Component Value Date   TRIG 98.0 05/26/2012   TRIG 74.0 11/25/2011   TRIG 64.0 04/21/2011   Lab Results  Component Value Date   CHOLHDL 4 05/26/2012   CHOLHDL 4 11/25/2011   CHOLHDL 3 04/21/2011   Lab Results  Component Value Date   LDLDIRECT 146.1 11/25/2011   LDLDIRECT 144.2 04/21/2011   LDLDIRECT 134.8 10/30/2009   diet change - is trying the "Reuel Boom plan" - getting rid of white flour and simple sugars/ more fruit and vegetables Did change to almond milk  Has avoided eggs for the most part - has once in a while  More [place] Staying away from red meat and fried foods   She is about to start a walking program with a neighbor   Patient Active Problem List  Diagnosis  . GERD (gastroesophageal reflux disease)  . OA (osteoarthritis)  . Hyperlipidemia  . Osteopenia  . Overweight   Past Medical History  Diagnosis Date  . GERD (gastroesophageal reflux disease)   . Osteoarthritis   . Pneumonia 2004  . Foot pain     Left; nerve problem; procedure  . Hyperlipidemia     Mild  . Osteopenia   . Hepatitis B carrier     Positive hep B test at [facility]  . Elevated liver enzymes    Past Surgical History  Procedure Laterality Date  . Partial hysterectomy    . Back surgery    . "blocked kidney"    . Dexa      Osteopenis 9/01, improved 10/03, decreased BMD 5/06  . US echocardiography  7/03    Abd-normal  .  Colonoscopy  9/06    Diverticulosis, hems  . US renal/aorta  2007    Neg  . Cardiovascular stress test  9/07    Neg   History  Substance Use Topics  . Smoking status: Never Smoker   . Smokeless tobacco: Not on file  . Alcohol Use: No   Family History  Problem Relation Age of Onset  . Lung cancer Father   . Cancer Sister     Bladder  . Heart attack Brother   . Leukemia Brother   . Stroke Brother    Allergies  Allergen Reactions  . Alendronate Sodium     REACTION: acid reflux  . Raloxifene     REACTION: severe muscle pain and weakness   Current Outpatient Prescriptions on File Prior to Visit  Medication Sig Dispense Refill  . aspirin 81 MG tablet Take 81 mg by mouth daily.        . Bepotastine Besilate (BEPREVE) 1.5 % SOLN Place 1 drop into both eyes as needed.      . Calcium Carbonate-Vitamin D (CALCIUM 600 + D PO) Take 1 tablet  by mouth 2 (two) times daily.      . fish oil-omega-3 fatty acids 1000 MG capsule Take 1 g by mouth 2 (two) times daily.       . fluticasone (FLONASE) 50 MCG/ACT nasal spray Place 2 sprays into the nose daily.      . Fluticasone-Salmeterol (ADVAIR DISKUS) 250-50 MCG/DOSE AEPB Inhale 1 puff into the lungs every 12 (twelve) hours.        . Glucosamine-Chondroit-Vit C-Mn (GLUCOSAMINE 1500 COMPLEX PO) Take by mouth 2 (two) times daily. Take as directed.      Marland Kitchen levocetirizine (XYZAL) 5 MG tablet Take 5 mg by mouth at bedtime.      . montelukast (SINGULAIR) 10 MG tablet Take 10 mg by mouth daily.        Marland Kitchen omeprazole (PRILOSEC) 20 MG capsule Take 1 capsule (20 mg total) by mouth daily.  90 capsule  3   No current facility-administered medications on file prior to visit.     Review of Systems Review of Systems  Constitutional: Negative for fever, appetite change, fatigue and unexpected weight change.  Eyes: Negative for pain and visual disturbance.  Respiratory: Negative for cough and shortness of breath.   Cardiovascular: Negative for cp or  palpitations    Gastrointestinal: Negative for nausea, diarrhea and constipation.  Genitourinary: Negative for urgency and frequency.  Skin: Negative for pallor or rash   Neurological: Negative for weakness, light-headedness, numbness and headaches.  Hematological: Negative for adenopathy. Does not bruise/bleed easily.  Psychiatric/Behavioral: Negative for dysphoric mood. The patient is not nervous/anxious.         Objective:   Physical Exam  Constitutional: She appears well-developed and well-nourished. No distress.  HENT:  Head: Normocephalic and atraumatic.  Eyes: Conjunctivae and EOM are normal. Pupils are equal, round, and reactive to light.  Neck: Normal range of motion. Neck supple. No JVD present. Carotid bruit is not present.  Cardiovascular: Normal rate and regular rhythm.   Pulmonary/Chest: Effort normal and breath sounds normal. No respiratory distress. She has no wheezes.  Abdominal: She exhibits no abdominal bruit.  Musculoskeletal: She exhibits no edema.  Lymphadenopathy:    She has no cervical adenopathy.  Neurological: She is alert.  Skin: Skin is warm and dry. No rash noted. No erythema. No pallor.  Psychiatric: She has a normal mood and affect.          Assessment & Plan:

## 2012-06-02 NOTE — Patient Instructions (Addendum)
Your cholesterol looks better ! (your LDL came down 20 points!) I am happy with that Avoid red meat/ fried foods/ egg yolks/ fatty breakfast meats/ butter, cheese and high fat dairy/ and shellfish   Try to eat the egg whites without the yolks Aim for 3-5 days with exercise weekly  Follow up in 6 months for annual exam with labs prior

## 2012-09-01 ENCOUNTER — Other Ambulatory Visit: Payer: Self-pay | Admitting: Family Medicine

## 2012-10-04 ENCOUNTER — Other Ambulatory Visit: Payer: Self-pay | Admitting: Dermatology

## 2012-10-04 DIAGNOSIS — D045 Carcinoma in situ of skin of trunk: Secondary | ICD-10-CM | POA: Diagnosis not present

## 2012-10-04 DIAGNOSIS — Z85828 Personal history of other malignant neoplasm of skin: Secondary | ICD-10-CM | POA: Diagnosis not present

## 2012-10-04 DIAGNOSIS — L570 Actinic keratosis: Secondary | ICD-10-CM | POA: Diagnosis not present

## 2012-10-04 DIAGNOSIS — D485 Neoplasm of uncertain behavior of skin: Secondary | ICD-10-CM | POA: Diagnosis not present

## 2012-10-04 DIAGNOSIS — L821 Other seborrheic keratosis: Secondary | ICD-10-CM | POA: Diagnosis not present

## 2012-10-10 DIAGNOSIS — Z85828 Personal history of other malignant neoplasm of skin: Secondary | ICD-10-CM | POA: Diagnosis not present

## 2012-10-10 DIAGNOSIS — L0100 Impetigo, unspecified: Secondary | ICD-10-CM | POA: Diagnosis not present

## 2012-11-23 DIAGNOSIS — J309 Allergic rhinitis, unspecified: Secondary | ICD-10-CM | POA: Diagnosis not present

## 2012-11-23 DIAGNOSIS — H1045 Other chronic allergic conjunctivitis: Secondary | ICD-10-CM | POA: Diagnosis not present

## 2012-11-23 DIAGNOSIS — J45909 Unspecified asthma, uncomplicated: Secondary | ICD-10-CM | POA: Diagnosis not present

## 2012-11-26 ENCOUNTER — Telehealth: Payer: Self-pay | Admitting: Family Medicine

## 2012-11-26 DIAGNOSIS — Z0000 Encounter for general adult medical examination without abnormal findings: Secondary | ICD-10-CM

## 2012-11-26 DIAGNOSIS — E785 Hyperlipidemia, unspecified: Secondary | ICD-10-CM

## 2012-11-26 DIAGNOSIS — M8580 Other specified disorders of bone density and structure, unspecified site: Secondary | ICD-10-CM

## 2012-11-26 NOTE — Telephone Encounter (Signed)
Message copied by Judy Pimple on Sun Nov 26, 2012 12:16 PM ------      Message from: Alvina Chou      Created: Tue Nov 21, 2012 11:53 AM      Regarding: Lab orders for Monday, 9.22.14       Patient is scheduled for CPX labs, please order future labs, Thanks , Terri       ------

## 2012-11-27 ENCOUNTER — Other Ambulatory Visit (INDEPENDENT_AMBULATORY_CARE_PROVIDER_SITE_OTHER): Payer: Medicare Other

## 2012-11-27 ENCOUNTER — Ambulatory Visit (INDEPENDENT_AMBULATORY_CARE_PROVIDER_SITE_OTHER): Payer: Medicare Other

## 2012-11-27 DIAGNOSIS — Z0000 Encounter for general adult medical examination without abnormal findings: Secondary | ICD-10-CM

## 2012-11-27 DIAGNOSIS — M8580 Other specified disorders of bone density and structure, unspecified site: Secondary | ICD-10-CM

## 2012-11-27 DIAGNOSIS — E785 Hyperlipidemia, unspecified: Secondary | ICD-10-CM

## 2012-11-27 DIAGNOSIS — Z23 Encounter for immunization: Secondary | ICD-10-CM

## 2012-11-27 DIAGNOSIS — M899 Disorder of bone, unspecified: Secondary | ICD-10-CM | POA: Diagnosis not present

## 2012-11-27 LAB — COMPREHENSIVE METABOLIC PANEL
ALT: 17 U/L (ref 0–35)
AST: 20 U/L (ref 0–37)
Albumin: 3.8 g/dL (ref 3.5–5.2)
Alkaline Phosphatase: 69 U/L (ref 39–117)
BUN: 13 mg/dL (ref 6–23)
CO2: 32 mEq/L (ref 19–32)
Calcium: 9.5 mg/dL (ref 8.4–10.5)
Chloride: 103 mEq/L (ref 96–112)
Creatinine, Ser: 0.8 mg/dL (ref 0.4–1.2)
GFR: 76.25 mL/min (ref >60.00)
Glucose, Bld: 86 mg/dL (ref 70–99)
Potassium: 3.9 mEq/L (ref 3.5–5.1)
Sodium: 141 mEq/L (ref 135–145)
Total Bilirubin: 0.9 mg/dL (ref 0.3–1.2)
Total Protein: 7.4 g/dL (ref 6.0–8.3)

## 2012-11-27 LAB — LDL CHOLESTEROL, DIRECT: Direct LDL: 123.5 mg/dL

## 2012-11-27 LAB — CBC WITH DIFFERENTIAL/PLATELET
Basophils Absolute: 0 10*3/uL (ref 0.0–0.1)
Basophils Relative: 0.4 % (ref 0.0–3.0)
Eosinophils Absolute: 0.2 10*3/uL (ref 0.0–0.7)
Eosinophils Relative: 2.5 % (ref 0.0–5.0)
HCT: 39.5 % (ref 36.0–46.0)
Hemoglobin: 13.2 g/dL (ref 12.0–15.0)
Lymphocytes Relative: 39.7 % (ref 12.0–46.0)
Lymphs Abs: 3 10*3/uL (ref 0.7–4.0)
MCHC: 33.4 g/dL (ref 30.0–36.0)
MCV: 90.2 fl (ref 78.0–100.0)
Monocytes Absolute: 0.8 10*3/uL (ref 0.1–1.0)
Monocytes Relative: 10.3 % (ref 3.0–12.0)
Neutro Abs: 3.5 10*3/uL (ref 1.4–7.7)
Neutrophils Relative %: 47.1 % (ref 43.0–77.0)
Platelets: 205 10*3/uL (ref 150.0–400.0)
RBC: 4.37 Mil/uL (ref 3.87–5.11)
RDW: 13.3 % (ref 11.5–14.6)
WBC: 7.4 10*3/uL (ref 4.5–10.5)

## 2012-11-27 LAB — TSH: TSH: 1.66 u[IU]/mL (ref 0.35–5.50)

## 2012-11-27 LAB — LIPID PANEL
Cholesterol: 214 mg/dL — ABNORMAL HIGH (ref 0–200)
HDL: 66.6 mg/dL (ref >39.00)
Total CHOL/HDL Ratio: 3
Triglycerides: 103 mg/dL (ref 0.0–149.0)
VLDL: 20.6 mg/dL (ref 0.0–40.0)

## 2012-11-28 ENCOUNTER — Other Ambulatory Visit: Payer: Self-pay | Admitting: Family Medicine

## 2012-11-28 LAB — VITAMIN D 25 HYDROXY (VIT D DEFICIENCY, FRACTURES): Vit D, 25-Hydroxy: 77 ng/mL (ref 30–89)

## 2012-12-04 ENCOUNTER — Ambulatory Visit (INDEPENDENT_AMBULATORY_CARE_PROVIDER_SITE_OTHER): Payer: Medicare Other | Admitting: Family Medicine

## 2012-12-04 ENCOUNTER — Encounter: Payer: Self-pay | Admitting: Family Medicine

## 2012-12-04 VITALS — BP 110/68 | HR 69 | Temp 97.8°F | Ht 60.25 in | Wt 146.5 lb

## 2012-12-04 DIAGNOSIS — M899 Disorder of bone, unspecified: Secondary | ICD-10-CM | POA: Diagnosis not present

## 2012-12-04 DIAGNOSIS — E785 Hyperlipidemia, unspecified: Secondary | ICD-10-CM | POA: Diagnosis not present

## 2012-12-04 DIAGNOSIS — M8580 Other specified disorders of bone density and structure, unspecified site: Secondary | ICD-10-CM

## 2012-12-04 DIAGNOSIS — Z0000 Encounter for general adult medical examination without abnormal findings: Secondary | ICD-10-CM | POA: Diagnosis not present

## 2012-12-04 NOTE — Patient Instructions (Addendum)
If you are interested in a shingles/zoster vaccine - call your insurance to check on coverage,( you should not get it within 1 month of other vaccines) , then call us for a prescription  for it to take to a pharmacy that gives the shot , or make a nurse visit to get it here depending on your coverage Avoid red meat/ fried foods/ egg yolks/ fatty breakfast meats/ butter, cheese and high fat dairy/ and shellfish   Try to get exercise every day - indoors or out  Don't forget to make your annual mammogram appointment-you do not need a referral for that

## 2012-12-04 NOTE — Assessment & Plan Note (Signed)
Pt has very mild kyphosis /no fx or falls and good D level Rev dexa 3/13  Will repeat at 2 years Disc safety and fall prev She will start more indoor exercise also

## 2012-12-04 NOTE — Progress Notes (Signed)
Subjective:    Patient ID: Erika Evans, female    DOB: September 23, 1934, 78 y.o.   MRN: [id number]  HPI I have personally reviewed the Medicare Annual Wellness questionnaire and have noted 1. The patient's medical and social history 2. Their use of alcohol, tobacco or illicit drugs 3. Their current medications and supplements 4. The patient's functional ability including ADL's, fall risks, home safety risks and hearing or visual             impairment. 5. Diet and physical activities 6. Evidence for depression or mood disorders  The patients weight, height, BMI have been recorded in the chart and visual acuity is per eye clinic.  I have made referrals, counseling and provided education to the patient based review of the above and I have provided the pt with a written personalized care plan for preventive services.  Feels overall pretty good   Wt is up 1 lb with bmi of 28 Is trying to eat a very healthy diet  Walks around the house - gets out when she can   See scanned forms.  Routine anticipatory guidance given to patient.  See health maintenance. Flu vaccine 9/14  Shingles vaccine - she is interested in it  PNA 2/13 vaccine  Tetanus 2/13 vaccine  Colon 9/06 - no bowel changes or problems  Breast cancer screening- mammogram nl 10/13 - she got her mailed card to make her own appt No gyn problems -nl pap 08 and had partial hyst  Advance directive - she has a living will set up already Cognitive function addressed- see scanned forms- and if abnormal then additional documentation follows. -- she has routine forgetfulness but not a problem   Falls-none , and no fractures   Mood- good overall / stays motivated and positive   Osteopenia  dexa 3/13  Vit D level is 77   Hyperlipidemia Lab Results  Component Value Date   CHOL 214* 11/27/2012   CHOL 198 05/26/2012   CHOL 233* 11/25/2011   Lab Results  Component Value Date   HDL 66.60 11/27/2012   HDL 46.96 05/26/2012   HDL 29.52  11/25/2011   Lab Results  Component Value Date   LDLCALC 126* 05/26/2012   LDLCALC 103* 05/04/2010   LDLCALC 116* 05/25/2007   Lab Results  Component Value Date   TRIG 103.0 11/27/2012   TRIG 98.0 05/26/2012   TRIG 74.0 11/25/2011   Lab Results  Component Value Date   CHOLHDL 3 11/27/2012   CHOLHDL 4 05/26/2012   CHOLHDL 4 11/25/2011   Lab Results  Component Value Date   LDLDIRECT 123.5 11/27/2012   LDLDIRECT 146.1 11/25/2011   LDLDIRECT 144.2 04/21/2011  overall fairly stable with good HDL - she does watch diet    PMH and SH reviewed  Meds, vitals, and allergies reviewed.   ROS: See HPI.  Otherwise negative.    Patient Active Problem List   Diagnosis Date Noted  . Encounter for Medicare annual wellness exam 11/26/2012  . GERD (gastroesophageal reflux disease) 04/21/2011  . OA (osteoarthritis) 04/21/2011  . Hyperlipidemia 04/21/2011  . Osteopenia 04/21/2011  . Overweight 04/21/2011   Past Medical History  Diagnosis Date  . GERD (gastroesophageal reflux disease)   . Osteoarthritis   . Pneumonia 2004  . Foot pain     Left; nerve problem; procedure  . Hyperlipidemia     Mild  . Osteopenia   . Hepatitis B carrier     Positive hep B test at [facility]  . Elevated liver enzymes    Past Surgical History  Procedure Laterality Date  . Partial hysterectomy    . Back surgery    . "blocked kidney"    . Dexa      Osteopenis 9/01, improved 10/03, decreased BMD 5/06  . US echocardiography  7/03    Abd-normal  . Colonoscopy  9/06    Diverticulosis, hems  . US renal/aorta  2007    Neg  . Cardiovascular stress test  9/07    Neg   History  Substance Use Topics  . Smoking status: Never Smoker   . Smokeless tobacco: Not on file  . Alcohol Use: No   Family History  Problem Relation Age of Onset  . Lung cancer Father   . Cancer Sister     Bladder  . Heart attack Brother   . Leukemia Brother   . Stroke Brother    Allergies  Allergen Reactions  . Alendronate Sodium      REACTION: acid reflux  . Raloxifene     REACTION: severe muscle pain and weakness   Current Outpatient Prescriptions on File Prior to Visit  Medication Sig Dispense Refill  . aspirin 81 MG tablet Take 81 mg by mouth daily.        . Bepotastine Besilate (BEPREVE) 1.5 % SOLN Place 1 drop into both eyes as needed.      . Calcium Carbonate-Vitamin D (CALCIUM 600 + D PO) Take 1 tablet by mouth 2 (two) times daily.      . Cholecalciferol (VITAMIN D-3) 1000 UNITS CAPS Take 2 capsules by mouth daily.      . fish oil-omega-3 fatty acids 1000 MG capsule Take 1 g by mouth 2 (two) times daily.       . Fluticasone-Salmeterol (ADVAIR DISKUS) 250-50 MCG/DOSE AEPB Inhale 1 puff into the lungs every 12 (twelve) hours.       . Glucosamine-Chondroit-Vit C-Mn (GLUCOSAMINE 1500 COMPLEX PO) Take by mouth 2 (two) times daily. Take as directed.      Marland Kitchen levocetirizine (XYZAL) 5 MG tablet Take 5 mg by mouth at bedtime.      . montelukast (SINGULAIR) 10 MG tablet Take 10 mg by mouth daily.        Marland Kitchen omeprazole (PRILOSEC) 20 MG capsule TAKE 1 CAPSULE DAILY  90 capsule  1   No current facility-administered medications on file prior to visit.    Review of Systems Review of Systems  Constitutional: Negative for fever, appetite change, fatigue and unexpected weight change.  Eyes: Negative for pain and visual disturbance.  Respiratory: Negative for cough and shortness of breath.   Cardiovascular: Negative for cp or palpitations    Gastrointestinal: Negative for nausea, diarrhea and constipation.  Genitourinary: Negative for urgency and frequency.  Skin: Negative for pallor or rash   Neurological: Negative for weakness, light-headedness, numbness and headaches.  Hematological: Negative for adenopathy. Does not bruise/bleed easily.  Psychiatric/Behavioral: Negative for dysphoric mood. The patient is not nervous/anxious.         Objective:   Physical Exam  Constitutional: She appears well-developed and  well-nourished. No distress.  overwt and well app  HENT:  Head: Normocephalic and atraumatic.  Right Ear: External ear normal.  Left Ear: External ear normal.  Nose: Nose normal.  Mouth/Throat: Oropharynx is clear and moist.  Eyes: Conjunctivae and EOM are normal. Pupils are equal, round, and reactive to light. Right eye exhibits no discharge. Left eye exhibits no discharge. No scleral icterus.  Neck: Normal range of motion. Neck supple. No JVD present. Carotid bruit is not present. No thyromegaly present.  Cardiovascular: Normal rate, regular rhythm, normal heart sounds and intact distal pulses.  Exam reveals no gallop.   Pulmonary/Chest: Effort normal and breath sounds normal. No respiratory distress. She has no wheezes. She has no rales.  Abdominal: Soft. Bowel sounds are normal. She exhibits no distension, no abdominal bruit and no mass. There is no tenderness.  Genitourinary: No breast swelling, tenderness, discharge or bleeding.  Breast exam: No mass, nodules, thickening, tenderness, bulging, retraction, inflamation, nipple discharge or skin changes noted.  No axillary or clavicular LA.  Chaperoned exam.    Musculoskeletal: Normal range of motion. She exhibits no edema and no tenderness.  Very mild kyphosis unchanged   Lymphadenopathy:    She has no cervical adenopathy.  Neurological: She is alert. She has normal reflexes. No cranial nerve deficit. She exhibits normal muscle tone. Coordination normal.  Skin: Skin is warm and dry. No rash noted. No erythema. No pallor.  SKs on back  Small pink area from recent bx at dermatology- healing well Solar lentigos diffusely   Psychiatric: She has a normal mood and affect.          Assessment & Plan:

## 2012-12-04 NOTE — Assessment & Plan Note (Signed)
Reviewed health habits including diet and exercise and skin cancer prevention Also reviewed health mt list, fam hx and immunizations  See HPI Pt will schedule own mammogram Also check on coverage for zoster vaccine  Wellness labs reviewed

## 2012-12-04 NOTE — Assessment & Plan Note (Signed)
Disc goals for lipids and reasons to control them Rev labs with pt Rev low sat fat diet in detail  LDL and HDL are both up but ratio is improved  Will continue to follow

## 2013-01-05 DIAGNOSIS — H524 Presbyopia: Secondary | ICD-10-CM | POA: Diagnosis not present

## 2013-01-05 DIAGNOSIS — H2510 Age-related nuclear cataract, unspecified eye: Secondary | ICD-10-CM | POA: Diagnosis not present

## 2013-01-26 ENCOUNTER — Other Ambulatory Visit: Payer: Self-pay

## 2013-01-26 DIAGNOSIS — Z1231 Encounter for screening mammogram for malignant neoplasm of breast: Secondary | ICD-10-CM

## 2013-03-05 ENCOUNTER — Ambulatory Visit (INDEPENDENT_AMBULATORY_CARE_PROVIDER_SITE_OTHER)
Admission: RE | Admit: 2013-03-05 | Discharge: 2013-03-05 | Disposition: A | Discharge status: Home / Self Care | Payer: Medicare Other | Source: Ambulatory Visit | Attending: Family Medicine | Admitting: Family Medicine

## 2013-03-05 ENCOUNTER — Ambulatory Visit (INDEPENDENT_AMBULATORY_CARE_PROVIDER_SITE_OTHER): Payer: Medicare Other | Admitting: Family Medicine

## 2013-03-05 ENCOUNTER — Encounter: Payer: Self-pay | Admitting: Family Medicine

## 2013-03-05 VITALS — BP 126/74 | HR 77 | Temp 98.1°F | Ht 60.25 in | Wt 150.5 lb

## 2013-03-05 DIAGNOSIS — J209 Acute bronchitis, unspecified: Secondary | ICD-10-CM | POA: Diagnosis not present

## 2013-03-05 DIAGNOSIS — R918 Other nonspecific abnormal finding of lung field: Secondary | ICD-10-CM | POA: Diagnosis not present

## 2013-03-05 MED ORDER — AZITHROMYCIN 250 MG PO TABS: ORAL_TABLET | ORAL | Status: DC

## 2013-03-05 NOTE — Patient Instructions (Signed)
Chest xray today- we call you with results Take zithromax as directed  Drink lots of fluids and continue mucinex to loosen phlegm  Call your allergist office for refill of your rescue inhaler (it is not on our med list for you) Update if not starting to improve in a week or if worsening  - especially wheezing - please let us know

## 2013-03-05 NOTE — Progress Notes (Signed)
Pre-visit discussion using our clinic review tool. No additional management support is needed unless otherwise documented below in the visit note.  

## 2013-03-05 NOTE — Assessment & Plan Note (Signed)
Reassuring exam May have started with cold or flu  cxr in light of hemoptysis  Will cover with zpak Disc symptomatic care - see instructions on AVS  Update if not starting to improve in a week or if worsening  Esp if wheezing

## 2013-03-05 NOTE — Progress Notes (Signed)
Subjective:    Patient ID: Erika Evans, female    DOB: 1934-11-25, 77 y.o.   MRN: [id number]  HPI Here for f/u after an "asthma attack"-- Wednesday  She was talking to a panicky family member after a death in the family  She took a deep breath and suddenly became short of breath and wheezy  She had chest congestion as well and she coughed up a bit - (there was blood in it)  She started taking mucinex for congestion   Now she has some head and nasal congestion  She has had a fever - went up to 102 (chills and body aches ) She stayed in bed over xmas Took tylenol  Was briefly exp to someone with the flu (for a moment)- also some family members were sick  Temp now just goes up in the evening    She blows out yellow d/c  Not much from lungs now   Never smoked   Current meds - advair bid singulair once daily  She has a rescue inhaler - needs a refill of that - albuterol  Her f/u with allergist is a while off    Patient Active Problem List   Diagnosis Date Noted  . Encounter for Medicare annual wellness exam 11/26/2012  . GERD (gastroesophageal reflux disease) 04/21/2011  . OA (osteoarthritis) 04/21/2011  . Hyperlipidemia 04/21/2011  . Osteopenia 04/21/2011  . Overweight 04/21/2011   Past Medical History  Diagnosis Date  . GERD (gastroesophageal reflux disease)   . Osteoarthritis   . Pneumonia 2004  . Foot pain     Left; nerve problem; procedure  . Hyperlipidemia     Mild  . Osteopenia   . Hepatitis B carrier     Positive hep B test at [facility]  . Elevated liver enzymes    Past Surgical History  Procedure Laterality Date  . Partial hysterectomy    . Back surgery    . "blocked kidney"    . Dexa      Osteopenis 9/01, improved 10/03, decreased BMD 5/06  . US echocardiography  7/03    Abd-normal  . Colonoscopy  9/06    Diverticulosis, hems  . US renal/aorta  2007    Neg  . Cardiovascular stress test  9/07    Neg   History  Substance Use Topics  .  Smoking status: Never Smoker   . Smokeless tobacco: Not on file  . Alcohol Use: No   Family History  Problem Relation Age of Onset  . Lung cancer Father   . Cancer Sister     Bladder  . Heart attack Brother   . Leukemia Brother   . Stroke Brother    Allergies  Allergen Reactions  . Alendronate Sodium     REACTION: acid reflux  . Raloxifene     REACTION: severe muscle pain and weakness   Current Outpatient Prescriptions on File Prior to Visit  Medication Sig Dispense Refill  . aspirin 81 MG tablet Take 81 mg by mouth daily.        . Bepotastine Besilate (BEPREVE) 1.5 % SOLN Place 1 drop into both eyes as needed.      . Calcium Carbonate-Vitamin D (CALCIUM 600 + D PO) Take 1 tablet by mouth 2 (two) times daily.      . Cholecalciferol (VITAMIN D-3) 1000 UNITS CAPS Take 2 capsules by mouth daily.      . fish oil-omega-3 fatty acids 1000 MG capsule Take 1 g  by mouth 2 (two) times daily.       . Fluticasone-Salmeterol (ADVAIR DISKUS) 250-50 MCG/DOSE AEPB Inhale 1 puff into the lungs every 12 (twelve) hours.       . Glucosamine-Chondroit-Vit C-Mn (GLUCOSAMINE 1500 COMPLEX PO) Take by mouth 2 (two) times daily. Take as directed.      Marland Kitchen levocetirizine (XYZAL) 5 MG tablet Take 5 mg by mouth at bedtime.      . montelukast (SINGULAIR) 10 MG tablet Take 10 mg by mouth daily.        Marland Kitchen omeprazole (PRILOSEC) 20 MG capsule TAKE 1 CAPSULE DAILY  90 capsule  1   No current facility-administered medications on file prior to visit.    Review of Systems Review of Systems  Constitutional: Negative for fever, appetite change, fatigue and unexpected weight change.  ENT pos for congestion and drip  Eyes: Negative for pain and visual disturbance.  Respiratory: Negative for  shortness of breath.   Cardiovascular: Negative for cp or palpitations    Gastrointestinal: Negative for nausea, diarrhea and constipation.  Genitourinary: Negative for urgency and frequency.  Skin: Negative for pallor or rash     Neurological: Negative for weakness, light-headedness, numbness and headaches.  Hematological: Negative for adenopathy. Does not bruise/bleed easily.  Psychiatric/Behavioral: Negative for dysphoric mood. The patient is not nervous/anxious.         Objective:   Physical Exam  Constitutional: She appears well-developed and well-nourished. No distress.  HENT:  Head: Normocephalic and atraumatic.  Right Ear: External ear normal.  Left Ear: External ear normal.  Mouth/Throat: Oropharynx is clear and moist.  Eyes: Conjunctivae and EOM are normal. Pupils are equal, round, and reactive to light. Right eye exhibits no discharge. Left eye exhibits no discharge. No scleral icterus.  Neck: Normal range of motion. Neck supple.  Cardiovascular: Normal rate and regular rhythm.   Pulmonary/Chest: Effort normal and breath sounds normal. No respiratory distress. She has no wheezes. She has no rales.  Few scattered rhonchi No wheezing   Lymphadenopathy:    She has no cervical adenopathy.  Neurological: She is alert. She has normal reflexes. No cranial nerve deficit. She exhibits normal muscle tone. Coordination normal.  Skin: Skin is warm and dry. No rash noted.  Psychiatric: She has a normal mood and affect.          Assessment & Plan:

## 2013-03-06 ENCOUNTER — Ambulatory Visit
Admission: RE | Admit: 2013-03-06 | Discharge: 2013-03-06 | Disposition: A | Discharge status: Home / Self Care | Payer: Medicare Other | Source: Ambulatory Visit

## 2013-03-06 DIAGNOSIS — Z1231 Encounter for screening mammogram for malignant neoplasm of breast: Secondary | ICD-10-CM | POA: Diagnosis not present

## 2013-03-13 ENCOUNTER — Other Ambulatory Visit: Payer: Self-pay | Admitting: Family Medicine

## 2013-03-13 DIAGNOSIS — R928 Other abnormal and inconclusive findings on diagnostic imaging of breast: Secondary | ICD-10-CM

## 2013-03-16 ENCOUNTER — Ambulatory Visit
Admission: RE | Admit: 2013-03-16 | Discharge: 2013-03-16 | Disposition: A | Discharge status: Home / Self Care | Payer: Medicare Other | Source: Ambulatory Visit | Attending: Family Medicine | Admitting: Family Medicine

## 2013-03-16 DIAGNOSIS — N641 Fat necrosis of breast: Secondary | ICD-10-CM | POA: Diagnosis not present

## 2013-03-16 DIAGNOSIS — R928 Other abnormal and inconclusive findings on diagnostic imaging of breast: Secondary | ICD-10-CM

## 2013-03-20 ENCOUNTER — Ambulatory Visit (INDEPENDENT_AMBULATORY_CARE_PROVIDER_SITE_OTHER)
Admission: RE | Admit: 2013-03-20 | Discharge: 2013-03-20 | Disposition: A | Discharge status: Home / Self Care | Payer: Medicare Other | Source: Ambulatory Visit | Attending: Family Medicine | Admitting: Family Medicine

## 2013-03-20 ENCOUNTER — Encounter: Payer: Self-pay | Admitting: Family Medicine

## 2013-03-20 ENCOUNTER — Ambulatory Visit (INDEPENDENT_AMBULATORY_CARE_PROVIDER_SITE_OTHER): Payer: Medicare Other | Admitting: Family Medicine

## 2013-03-20 VITALS — BP 136/88 | HR 64 | Temp 98.0°F | Ht 60.25 in | Wt 149.0 lb

## 2013-03-20 DIAGNOSIS — J9819 Other pulmonary collapse: Secondary | ICD-10-CM | POA: Diagnosis not present

## 2013-03-20 DIAGNOSIS — J209 Acute bronchitis, unspecified: Secondary | ICD-10-CM

## 2013-03-20 NOTE — Patient Instructions (Signed)
Keep getting rest and drinking fluids  Chest xray today  Result tomorrow   If symptoms come back let me know

## 2013-03-20 NOTE — Progress Notes (Signed)
Pre-visit discussion using our clinic review tool. No additional management support is needed unless otherwise documented below in the visit note.  

## 2013-03-20 NOTE — Progress Notes (Signed)
Subjective:    Patient ID: Erika Evans, female    DOB: 12/05/1934, 79 y.o.   MRN: [id number]  HPI Here for f/u of bronchitis Took the zpak Her cxr showed R basilar opacity - ? Atelectasis or pneumonia   Felt weak for a while Still coughing just a little bit  Sluggish today- but much better No fever   Patient Active Problem List   Diagnosis Date Noted  . Acute bronchitis with bronchospasm 03/05/2013  . Encounter for Medicare annual wellness exam 11/26/2012  . GERD (gastroesophageal reflux disease) 04/21/2011  . OA (osteoarthritis) 04/21/2011  . Hyperlipidemia 04/21/2011  . Osteopenia 04/21/2011  . Overweight 04/21/2011   Past Medical History  Diagnosis Date  . GERD (gastroesophageal reflux disease)   . Osteoarthritis   . Pneumonia 2004  . Foot pain     Left; nerve problem; procedure  . Hyperlipidemia     Mild  . Osteopenia   . Hepatitis B carrier     Positive hep B test at [facility]  . Elevated liver enzymes    Past Surgical History  Procedure Laterality Date  . Partial hysterectomy    . Back surgery    . "blocked kidney"    . Dexa      Osteopenis 9/01, improved 10/03, decreased BMD 5/06  . US echocardiography  7/03    Abd-normal  . Colonoscopy  9/06    Diverticulosis, hems  . US renal/aorta  2007    Neg  . Cardiovascular stress test  9/07    Neg   History  Substance Use Topics  . Smoking status: Never Smoker   . Smokeless tobacco: Not on file  . Alcohol Use: No   Family History  Problem Relation Age of Onset  . Lung cancer Father   . Cancer Sister     Bladder  . Heart attack Brother   . Leukemia Brother   . Stroke Brother    Allergies  Allergen Reactions  . Alendronate Sodium     REACTION: acid reflux  . Raloxifene     REACTION: severe muscle pain and weakness   Current Outpatient Prescriptions on File Prior to Visit  Medication Sig Dispense Refill  . aspirin 81 MG tablet Take 81 mg by mouth daily.        . Bepotastine Besilate  (BEPREVE) 1.5 % SOLN Place 1 drop into both eyes as needed.      . Calcium Carbonate-Vitamin D (CALCIUM 600 + D PO) Take 1 tablet by mouth 2 (two) times daily.      . Cholecalciferol (VITAMIN D-3) 1000 UNITS CAPS Take 2 capsules by mouth daily.      . fish oil-omega-3 fatty acids 1000 MG capsule Take 1 g by mouth 2 (two) times daily.       . Fluticasone-Salmeterol (ADVAIR DISKUS) 250-50 MCG/DOSE AEPB Inhale 1 puff into the lungs every 12 (twelve) hours.       . Glucosamine-Chondroit-Vit C-Mn (GLUCOSAMINE 1500 COMPLEX PO) Take by mouth 2 (two) times daily. Take as directed.      Marland Kitchen levocetirizine (XYZAL) 5 MG tablet Take 5 mg by mouth at bedtime.      . montelukast (SINGULAIR) 10 MG tablet Take 10 mg by mouth daily.        Marland Kitchen omeprazole (PRILOSEC) 20 MG capsule TAKE 1 CAPSULE DAILY  90 capsule  1   No current facility-administered medications on file prior to visit.      Review of Systems  Review of Systems  Constitutional: Negative for fever, appetite change, and unexpected weight change. ENT neg for congestion   Eyes: Negative for pain and visual disturbance.  Respiratory: Negative for wheeze  and shortness of breath.   Cardiovascular: Negative for cp or palpitations    Gastrointestinal: Negative for nausea, diarrhea and constipation.  Genitourinary: Negative for urgency and frequency.  Skin: Negative for pallor or rash   Neurological: Negative for weakness, light-headedness, numbness and headaches.  Hematological: Negative for adenopathy. Does not bruise/bleed easily.  Psychiatric/Behavioral: Negative for dysphoric mood. The patient is not nervous/anxious.      Objective:   Physical Exam  Constitutional: She appears well-developed and well-nourished. No distress.  HENT:  Head: Normocephalic and atraumatic.  Nose: Nose normal.  Mouth/Throat: Oropharynx is clear and moist.  Eyes: Conjunctivae and EOM are normal. Pupils are equal, round, and reactive to light. Right eye exhibits  no discharge. Left eye exhibits no discharge.  Neck: Normal range of motion. Neck supple.  Cardiovascular: Normal rate and regular rhythm.   Pulmonary/Chest: Effort normal and breath sounds normal. No respiratory distress. She has no wheezes. She has no rales. She exhibits no tenderness.  Lymphadenopathy:    She has no cervical adenopathy.  Neurological: She is alert.  Skin: Skin is warm. No rash noted. No pallor.  Psychiatric: She has a normal mood and affect.          Assessment & Plan:

## 2013-03-22 NOTE — Assessment & Plan Note (Signed)
Much improved  cxr to re check basilar opacity today Expect further improvement

## 2013-05-23 ENCOUNTER — Other Ambulatory Visit: Payer: Self-pay | Admitting: Family Medicine

## 2013-07-12 DIAGNOSIS — H1045 Other chronic allergic conjunctivitis: Secondary | ICD-10-CM | POA: Diagnosis not present

## 2013-07-12 DIAGNOSIS — J45909 Unspecified asthma, uncomplicated: Secondary | ICD-10-CM | POA: Diagnosis not present

## 2013-07-12 DIAGNOSIS — J309 Allergic rhinitis, unspecified: Secondary | ICD-10-CM | POA: Diagnosis not present

## 2013-08-03 ENCOUNTER — Encounter: Payer: Self-pay | Admitting: Internal Medicine

## 2013-08-03 ENCOUNTER — Ambulatory Visit (INDEPENDENT_AMBULATORY_CARE_PROVIDER_SITE_OTHER): Payer: Medicare Other | Admitting: Internal Medicine

## 2013-08-03 VITALS — BP 128/70 | HR 84 | Temp 99.1°F | Wt 151.0 lb

## 2013-08-03 DIAGNOSIS — R509 Fever, unspecified: Secondary | ICD-10-CM | POA: Insufficient documentation

## 2013-08-03 NOTE — Progress Notes (Signed)
Pre visit review using our clinic review tool, if applicable. No additional management support is needed unless otherwise documented below in the visit note. 

## 2013-08-03 NOTE — Progress Notes (Signed)
Subjective:    Patient ID: Erika Evans, female    DOB: [birth year], 79 y.o.   MRN: [id number]  HPI Having a headache--yesterday Fever at home-- 100 orally Nose dripping constantly  Found tick on her leg yesterday-- in AM Scratched her leg and tick was on hand Pulling weeds the day before  No sig cough---has mild chronic cough only No rash No dysuria or hematuria  Current Outpatient Prescriptions on File Prior to Visit  Medication Sig Dispense Refill  . aspirin 81 MG tablet Take 81 mg by mouth daily.        . Bepotastine Besilate (BEPREVE) 1.5 % SOLN Place 1 drop into both eyes as needed.      . Calcium Carbonate-Vitamin D (CALCIUM 600 + D PO) Take 1 tablet by mouth 2 (two) times daily.      . Cholecalciferol (VITAMIN D-3) 1000 UNITS CAPS Take 2 capsules by mouth daily.      . fish oil-omega-3 fatty acids 1000 MG capsule Take 1 g by mouth 2 (two) times daily.       . Fluticasone-Salmeterol (ADVAIR DISKUS) 250-50 MCG/DOSE AEPB Inhale 1 puff into the lungs every 12 (twelve) hours.       . Glucosamine-Chondroit-Vit C-Mn (GLUCOSAMINE 1500 COMPLEX PO) Take by mouth 2 (two) times daily. Take as directed.      Marland Kitchen levocetirizine (XYZAL) 5 MG tablet Take 5 mg by mouth at bedtime.      . montelukast (SINGULAIR) 10 MG tablet Take 10 mg by mouth daily.        Marland Kitchen omeprazole (PRILOSEC) 20 MG capsule TAKE 1 CAPSULE DAILY  90 capsule  1   No current facility-administered medications on file prior to visit.    Allergies  Allergen Reactions  . Alendronate Sodium     REACTION: acid reflux  . Raloxifene     REACTION: severe muscle pain and weakness    Past Medical History  Diagnosis Date  . GERD (gastroesophageal reflux disease)   . Osteoarthritis   . Pneumonia 2004  . Foot pain     Left; nerve problem; procedure  . Hyperlipidemia     Mild  . Osteopenia   . Hepatitis B carrier     Positive hep B test at [facility]  . Elevated liver enzymes     Past Surgical History  Procedure  Laterality Date  . Partial hysterectomy    . Back surgery    . "blocked kidney"    . Dexa      Osteopenis 9/01, improved 10/03, decreased BMD 5/06  . US echocardiography  7/03    Abd-normal  . Colonoscopy  9/06    Diverticulosis, hems  . US renal/aorta  2007    Neg  . Cardiovascular stress test  9/07    Neg    Family History  Problem Relation Age of Onset  . Lung cancer Father   . Cancer Sister     Bladder  . Heart attack Brother   . Leukemia Brother   . Stroke Brother     History   Social History  . Marital Status: Widowed    Spouse Name: N/A    Number of Children: 0  . Years of Education: N/A   Occupational History  .  [employer]    Retired   Social History Main Topics  . Smoking status: Never Smoker   . Smokeless tobacco: Not on file  . Alcohol Use: No  . Drug Use: No  .  Sexual Activity: Not on file   Other Topics Concern  . Not on file   Social History Narrative   Lost husband November 2004.   Sometimes walks for exercise.   Planning trip to [place] 7/10.   Review of Systems No vomiting or diarrhea  Appetite is okay    Objective:   Physical Exam  Constitutional: She appears well-developed and well-nourished. No distress.  HENT:  Mouth/Throat: Oropharynx is clear and moist. No oropharyngeal exudate.  No sinus tenderness Moderate nasal inflammation  Neck: Normal range of motion. Neck supple.  Pulmonary/Chest: Effort normal and breath sounds normal. No respiratory distress. She has no wheezes. She has no rales.  Lymphadenopathy:    She has no cervical adenopathy.  Skin: No rash noted.  ~8-31mm of erythema surrounding tick bite site Not really indurated  No EM          Assessment & Plan:

## 2013-08-03 NOTE — Assessment & Plan Note (Signed)
Mild  Likely from respiratory illness No reason to think of tick fever at this point If worse headache, or generalized rash, would consider doxy just in case

## 2013-08-28 ENCOUNTER — Other Ambulatory Visit: Payer: Self-pay | Admitting: Family Medicine

## 2013-08-28 DIAGNOSIS — N641 Fat necrosis of breast: Secondary | ICD-10-CM

## 2013-09-12 ENCOUNTER — Other Ambulatory Visit: Payer: Self-pay

## 2013-09-12 ENCOUNTER — Other Ambulatory Visit: Payer: Self-pay | Admitting: Family Medicine

## 2013-09-12 DIAGNOSIS — N641 Fat necrosis of breast: Secondary | ICD-10-CM

## 2013-09-13 ENCOUNTER — Ambulatory Visit
Admission: RE | Admit: 2013-09-13 | Discharge: 2013-09-13 | Disposition: A | Discharge status: Home / Self Care | Payer: Medicare Other | Source: Ambulatory Visit | Attending: Family Medicine | Admitting: Family Medicine

## 2013-09-13 DIAGNOSIS — R928 Other abnormal and inconclusive findings on diagnostic imaging of breast: Secondary | ICD-10-CM | POA: Diagnosis not present

## 2013-09-13 DIAGNOSIS — N641 Fat necrosis of breast: Secondary | ICD-10-CM

## 2013-09-13 DIAGNOSIS — N6459 Other signs and symptoms in breast: Secondary | ICD-10-CM | POA: Diagnosis not present

## 2013-10-08 DIAGNOSIS — L570 Actinic keratosis: Secondary | ICD-10-CM | POA: Diagnosis not present

## 2013-10-08 DIAGNOSIS — L821 Other seborrheic keratosis: Secondary | ICD-10-CM | POA: Diagnosis not present

## 2013-10-08 DIAGNOSIS — Z85828 Personal history of other malignant neoplasm of skin: Secondary | ICD-10-CM | POA: Diagnosis not present

## 2013-10-24 ENCOUNTER — Encounter: Payer: Self-pay | Admitting: Family Medicine

## 2013-10-24 ENCOUNTER — Ambulatory Visit (INDEPENDENT_AMBULATORY_CARE_PROVIDER_SITE_OTHER): Payer: Medicare Other | Admitting: Family Medicine

## 2013-10-24 VITALS — BP 128/76 | HR 60 | Temp 98.2°F | Ht 60.25 in | Wt 145.5 lb

## 2013-10-24 DIAGNOSIS — R0989 Other specified symptoms and signs involving the circulatory and respiratory systems: Secondary | ICD-10-CM

## 2013-10-24 LAB — CBC WITH DIFFERENTIAL/PLATELET
BASOS ABS: 0 10*3/uL (ref 0.0–0.1)
Basophils Relative: 0.2 % (ref 0.0–3.0)
Eosinophils Absolute: 0.1 10*3/uL (ref 0.0–0.7)
Eosinophils Relative: 1.1 % (ref 0.0–5.0)
HEMATOCRIT: 37.7 % (ref 36.0–46.0)
Hemoglobin: 12.4 g/dL (ref 12.0–15.0)
Lymphocytes Relative: 35.4 % (ref 12.0–46.0)
Lymphs Abs: 3.1 10*3/uL (ref 0.7–4.0)
MCHC: 32.9 g/dL (ref 30.0–36.0)
MCV: 91.2 fl (ref 78.0–100.0)
MONOS PCT: 11.8 % (ref 3.0–12.0)
Monocytes Absolute: 1 10*3/uL (ref 0.1–1.0)
Neutro Abs: 4.5 10*3/uL (ref 1.4–7.7)
Neutrophils Relative %: 51.5 % (ref 43.0–77.0)
PLATELETS: 207 10*3/uL (ref 150.0–400.0)
RBC: 4.14 Mil/uL (ref 3.87–5.11)
RDW: 13.5 % (ref 11.5–15.5)
WBC: 8.7 10*3/uL (ref 4.0–10.5)

## 2013-10-24 LAB — MONONUCLEOSIS SCREEN: MONO SCREEN: NEGATIVE

## 2013-10-24 NOTE — Assessment & Plan Note (Signed)
Pt had pain over R anterior neck with a full feeling area (she states is almost gone today) Suspect LN although salivary gland issue could also be in play  Reassuring exam today  Will check cbc with diff and mono test today  If neg and symptoms return - would consider CT   Adv warm compress/tylenol prn

## 2013-10-24 NOTE — Progress Notes (Signed)
Subjective:    Patient ID: Erika Evans, female    DOB: 1934-11-12, 79 y.o.   MRN: [id number]  HPI Here with swollen glands in her neck   Over the past weekend - she had soreness under jaw on R side  Hurt to breathe and rad to ear  Tender to the touch  Swollen also  It is improved but not gone now  No other symptoms Did not suspect fever   She called dentist first - told her if no sore teeth or gums to come here  No tooth pain or sensitivity   Thinks this has happened before  Found a tick on back 2 wk ago - she caught it before it bit her   No exp to cats or other animals   Tylenol helped   Patient Active Problem List   Diagnosis Date Noted  . Fever 08/03/2013  . Acute bronchitis with bronchospasm 03/05/2013  . Encounter for Medicare annual wellness exam 11/26/2012  . GERD (gastroesophageal reflux disease) 04/21/2011  . OA (osteoarthritis) 04/21/2011  . Hyperlipidemia 04/21/2011  . Osteopenia 04/21/2011  . Overweight 04/21/2011   Past Medical History  Diagnosis Date  . GERD (gastroesophageal reflux disease)   . Osteoarthritis   . Pneumonia 2004  . Foot pain     Left; nerve problem; procedure  . Hyperlipidemia     Mild  . Osteopenia   . Hepatitis B carrier     Positive hep B test at [facility]  . Elevated liver enzymes    Past Surgical History  Procedure Laterality Date  . Partial hysterectomy    . Back surgery    . "blocked kidney"    . Dexa      Osteopenis 9/01, improved 10/03, decreased BMD 5/06  . US echocardiography  7/03    Abd-normal  . Colonoscopy  9/06    Diverticulosis, hems  . US renal/aorta  2007    Neg  . Cardiovascular stress test  9/07    Neg   History  Substance Use Topics  . Smoking status: Never Smoker   . Smokeless tobacco: Not on file  . Alcohol Use: No   Family History  Problem Relation Age of Onset  . Lung cancer Father   . Cancer Sister     Bladder  . Heart attack Brother   . Leukemia Brother   . Stroke  Brother    Allergies  Allergen Reactions  . Alendronate Sodium     REACTION: acid reflux  . Raloxifene     REACTION: severe muscle pain and weakness   Current Outpatient Prescriptions on File Prior to Visit  Medication Sig Dispense Refill  . aspirin 81 MG tablet Take 81 mg by mouth daily.        . Bepotastine Besilate (BEPREVE) 1.5 % SOLN Place 1 drop into both eyes as needed.      . Calcium Carbonate-Vitamin D (CALCIUM 600 + D PO) Take 1 tablet by mouth once a week.       . Cholecalciferol (VITAMIN D-3) 1000 UNITS CAPS Take 2 capsules by mouth daily.      . fish oil-omega-3 fatty acids 1000 MG capsule Take 1 g by mouth 2 (two) times daily.       . Fluticasone-Salmeterol (ADVAIR DISKUS) 250-50 MCG/DOSE AEPB Inhale 1 puff into the lungs every 12 (twelve) hours.       . Glucosamine-Chondroit-Vit C-Mn (GLUCOSAMINE 1500 COMPLEX PO) Take by mouth 2 (two) times daily. Take  as directed.      Marland Kitchen levocetirizine (XYZAL) 5 MG tablet Take 5 mg by mouth at bedtime.      . montelukast (SINGULAIR) 10 MG tablet Take 10 mg by mouth daily.        Marland Kitchen omeprazole (PRILOSEC) 20 MG capsule TAKE 1 CAPSULE DAILY  90 capsule  1   No current facility-administered medications on file prior to visit.    Review of Systems Review of Systems  Constitutional: Negative for fever, appetite change,  and unexpected weight change.  ENt neg for sinus pain/ cong or ST Eyes: Negative for pain and visual disturbance.  Respiratory: Negative for cough and shortness of breath.   Cardiovascular: Negative for cp or palpitations    Gastrointestinal: Negative for nausea, diarrhea and constipation.  Genitourinary: Negative for urgency and frequency.  Skin: Negative for pallor or rash   Neurological: Negative for weakness, light-headedness, numbness and headaches.  Hematological: Negative for adenopathy. Does not bruise/bleed easily.  Psychiatric/Behavioral: Negative for dysphoric mood. The patient is not nervous/anxious.           Objective:   Physical Exam  Constitutional: She appears well-developed and well-nourished. No distress.  overwt and well app  HENT:  Head: Normocephalic and atraumatic.  Right Ear: External ear normal.  Left Ear: External ear normal.  Nose: Nose normal.  Mouth/Throat: Oropharynx is clear and moist.  Eyes: Conjunctivae and EOM are normal. Pupils are equal, round, and reactive to light. Right eye exhibits no discharge. Left eye exhibits no discharge. No scleral icterus.  Neck: Normal range of motion. Neck supple. No JVD present. Carotid bruit is not present. No tracheal deviation present. No thyromegaly present.  There is an area of vague fullness under R mandible Area is slt tender No skin change or heat   Cardiovascular: Normal rate, regular rhythm and intact distal pulses.   Pulmonary/Chest: Effort normal and breath sounds normal. No respiratory distress. She has no wheezes. She has no rales.  Musculoskeletal: She exhibits no edema.  Lymphadenopathy:       Head (right side): Submandibular adenopathy present. No submental, no tonsillar, no preauricular, no posterior auricular and no occipital adenopathy present.       Head (left side): No submental, no submandibular, no tonsillar, no preauricular, no posterior auricular and no occipital adenopathy present.    She has no cervical adenopathy.    She has no axillary adenopathy.       Right: No inguinal, no supraclavicular and no epitrochlear adenopathy present.       Left: No inguinal, no supraclavicular and no epitrochlear adenopathy present.  Neurological: She has normal reflexes.  Skin: Skin is warm and dry. No rash noted. No erythema. No pallor.  Psychiatric: She has a normal mood and affect.          Assessment & Plan:   Problem List Items Addressed This Visit     Other   Lymph node symptom - Primary     Pt had pain over R anterior neck with a full feeling area (she states is almost gone today) Suspect LN although  salivary gland issue could also be in play  Reassuring exam today  Will check cbc with diff and mono test today  If neg and symptoms return - would consider CT   Adv warm compress/tylenol prn     Relevant Orders      CBC with Differential (Completed)      Mononucleosis screen (Completed)

## 2013-10-24 NOTE — Progress Notes (Signed)
Pre visit review using our clinic review tool, if applicable. No additional management support is needed unless otherwise documented below in the visit note. 

## 2013-10-24 NOTE — Patient Instructions (Signed)
Labs today for blood count and also mono test  Use a warm compress on the sore area of neck Watch for dental symptoms  Tylenol as needed We will update you

## 2013-10-26 ENCOUNTER — Telehealth: Payer: Self-pay

## 2013-10-26 NOTE — Telephone Encounter (Signed)
Called pt and line was still busy

## 2013-10-26 NOTE — Telephone Encounter (Signed)
Pt left /vm requesting cb about recent lab results; tried multiple times to call [phone] line remained busy. Results were released under my chart.

## 2013-11-02 NOTE — Telephone Encounter (Signed)
I called and informed pt of her lab results. She says she does feel better, but she'll call if anything changes.

## 2013-11-19 ENCOUNTER — Other Ambulatory Visit: Payer: Self-pay | Admitting: Family Medicine

## 2013-11-21 ENCOUNTER — Other Ambulatory Visit: Payer: Self-pay | Admitting: Family Medicine

## 2013-12-11 ENCOUNTER — Ambulatory Visit (INDEPENDENT_AMBULATORY_CARE_PROVIDER_SITE_OTHER): Payer: Medicare Other

## 2013-12-11 DIAGNOSIS — Z23 Encounter for immunization: Secondary | ICD-10-CM

## 2013-12-27 DIAGNOSIS — H25033 Anterior subcapsular polar age-related cataract, bilateral: Secondary | ICD-10-CM | POA: Diagnosis not present

## 2013-12-27 DIAGNOSIS — H00029 Hordeolum internum unspecified eye, unspecified eyelid: Secondary | ICD-10-CM | POA: Diagnosis not present

## 2013-12-27 DIAGNOSIS — H2513 Age-related nuclear cataract, bilateral: Secondary | ICD-10-CM | POA: Diagnosis not present

## 2014-01-08 ENCOUNTER — Telehealth: Payer: Self-pay | Admitting: Family Medicine

## 2014-01-08 NOTE — Telephone Encounter (Signed)
Patient Information:  Caller Name: [place]  Phone: [phone]  Patient: Erika Evans, Erika Evans  Gender: Female  DOB: Apr 15, 1934  Age: 79 Years  PCP: Erika Evans [facility])  Office Follow Up:  Does the office need to follow up with this patient?: Yes  Instructions For The Office: If there is an opening today with Dr. Glori Bickers please call pt.  RN Note:  Since this bm was passed several days ago and pt asymptomatic /RN looked for an office appt  Symptoms  Reason For Call & Symptoms: Pt had been constipated for appx 4 -5 days . She took 2 laxatives (Bisacodyl 31m) on Sunday night 01/06/14 and passed a tarry stool. She had a regular bm after that but none since. Pt does have a hx of diverticulitis. She denies any abdominal pain. Pt is afebrile.  Reviewed Health History In EMR: Yes  Reviewed Medications In EMR: Yes  Reviewed Allergies In EMR: Yes  Reviewed Surgeries / Procedures: Yes  Date of Onset of Symptoms: 01/01/2014  Guideline(s) Used:  Rectal Bleeding  Disposition Per Guideline:   Go to ED Now (or to Office with PCP Approval)/pt refused  To see any other Provider than Dr. Glori Bickers.  Reason For Disposition Reached:   Bloody, black, or tarry bowel movements  Advice Given:  N/A  Patient Will Follow Erika Evans Advice:  YES  Appointment Scheduled:  01/09/2014 10:15:00 Appointment Scheduled Provider:  Loura Evans [facility])

## 2014-01-08 NOTE — Telephone Encounter (Signed)
I will see her then  

## 2014-01-09 ENCOUNTER — Encounter: Payer: Self-pay | Admitting: Family Medicine

## 2014-01-09 ENCOUNTER — Telehealth: Payer: Self-pay

## 2014-01-09 ENCOUNTER — Ambulatory Visit (INDEPENDENT_AMBULATORY_CARE_PROVIDER_SITE_OTHER): Payer: Medicare Other | Admitting: Family Medicine

## 2014-01-09 VITALS — BP 122/78 | HR 77 | Temp 98.2°F | Ht 60.25 in | Wt 146.5 lb

## 2014-01-09 DIAGNOSIS — R195 Other fecal abnormalities: Secondary | ICD-10-CM | POA: Diagnosis not present

## 2014-01-09 NOTE — Assessment & Plan Note (Signed)
Brief/ 1 episode suspect from a pepto bismol dose the day before No further problems  Heme neg stool today  Will continue to follow  Will update if symptoms return (w/o pepto) Continue PPI for gerd

## 2014-01-09 NOTE — Patient Instructions (Signed)
Your stool is negative for blood today  I think the pepto bismol caused the brief dark stools  Avoid pepto in the future  If GI symptoms persist despite taking your prilosec (omeprazole)- let me know If dark stools return or any other symptoms let me know

## 2014-01-09 NOTE — Telephone Encounter (Signed)
Patient called to let Dr. Glori Bickers know that she received her shingles vaccine on 12/13/13 at [place]. Please update patients immunization records if needed

## 2014-01-09 NOTE — Progress Notes (Signed)
Pre visit review using our clinic review tool, if applicable. No additional management support is needed unless otherwise documented below in the visit note. 

## 2014-01-09 NOTE — Telephone Encounter (Signed)
Chart updated

## 2014-01-09 NOTE — Progress Notes (Signed)
Subjective:    Patient ID: Erika Evans, female    DOB: 1935-01-26, 79 y.o.   MRN: [id number]  HPI Here for occurrence of dark stool   Started with some constipation - started before a trip  Took dulcolax (store band)- took 2 (has taken that before) -that was Sunday  She takes it occasionally  Monday -had a stool that was dark (black) in color- very small  Next bm dark but not as much - with some cramping  Then it turned soft/loose and more normal in color  Of note she took 2 tablespoons of pepto bismol the on Sunday also -for indigestion    No red blood in stool   She has had some epigastric pain on and off  She does take omeprazole 20 mg daily   (occ misses a day)   No abd pain today    Last colonosc 2006 - it was normal  Never had an endoscopy   Patient Active Problem List   Diagnosis Date Noted  . Lymph node symptom 10/24/2013  . Fever 08/03/2013  . Acute bronchitis with bronchospasm 03/05/2013  . Encounter for Medicare annual wellness exam 11/26/2012  . GERD (gastroesophageal reflux disease) 04/21/2011  . OA (osteoarthritis) 04/21/2011  . Hyperlipidemia 04/21/2011  . Osteopenia 04/21/2011  . Overweight 04/21/2011   Past Medical History  Diagnosis Date  . GERD (gastroesophageal reflux disease)   . Osteoarthritis   . Pneumonia 2004  . Foot pain     Left; nerve problem; procedure  . Hyperlipidemia     Mild  . Osteopenia   . Hepatitis B carrier     Positive hep B test at [facility]  . Elevated liver enzymes    Past Surgical History  Procedure Laterality Date  . Partial hysterectomy    . Back surgery    . "blocked kidney"    . Dexa      Osteopenis 9/01, improved 10/03, decreased BMD 5/06  . US echocardiography  7/03    Abd-normal  . Colonoscopy  9/06    Diverticulosis, hems  . US renal/aorta  2007    Neg  . Cardiovascular stress test  9/07    Neg   History  Substance Use Topics  . Smoking status: Never Smoker   . Smokeless tobacco: Not on  file  . Alcohol Use: No   Family History  Problem Relation Age of Onset  . Lung cancer Father   . Cancer Sister     Bladder  . Heart attack Brother   . Leukemia Brother   . Stroke Brother    Allergies  Allergen Reactions  . Alendronate Sodium     REACTION: acid reflux  . Raloxifene     REACTION: severe muscle pain and weakness   Current Outpatient Prescriptions on File Prior to Visit  Medication Sig Dispense Refill  . aspirin 81 MG tablet Take 81 mg by mouth daily.      . Bepotastine Besilate (BEPREVE) 1.5 % SOLN Place 1 drop into both eyes as needed.    . Calcium Carbonate-Vitamin D (CALCIUM 600 + D PO) Take 1 tablet by mouth once a week.     . Cholecalciferol (VITAMIN D-3) 1000 UNITS CAPS Take 2 capsules by mouth daily.    . fish oil-omega-3 fatty acids 1000 MG capsule Take 1 g by mouth 2 (two) times daily.     . Fluticasone-Salmeterol (ADVAIR DISKUS) 250-50 MCG/DOSE AEPB Inhale 1 puff into the lungs every 12 (twelve)  hours.     . Glucosamine-Chondroit-Vit C-Mn (GLUCOSAMINE 1500 COMPLEX PO) Take 1 capsule by mouth daily. Take as directed.    Marland Kitchen levocetirizine (XYZAL) 5 MG tablet Take 5 mg by mouth at bedtime.    . montelukast (SINGULAIR) 10 MG tablet Take 10 mg by mouth daily.      Marland Kitchen omeprazole (PRILOSEC) 20 MG capsule TAKE 1 CAPSULE DAILY 90 capsule 1   No current facility-administered medications on file prior to visit.      Review of Systems Review of Systems  Constitutional: Negative for fever, appetite change, fatigue and unexpected weight change.  Eyes: Negative for pain and visual disturbance.  Respiratory: Negative for cough and shortness of breath.   Cardiovascular: Negative for cp or palpitations    Gastrointestinal: Negative for nausea, vomiting or red blood in the stool, pos for intermittent constipation and loose stool  Genitourinary: Negative for urgency and frequency.  Skin: Negative for pallor or rash   Neurological: Negative for weakness,  light-headedness, numbness and headaches.  Hematological: Negative for adenopathy. Does not bruise/bleed easily.  Psychiatric/Behavioral: Negative for dysphoric mood. The patient is not nervous/anxious.         Objective:   Physical Exam  Constitutional: She appears well-developed and well-nourished. No distress.  HENT:  Head: Normocephalic and atraumatic.  Mouth/Throat: Oropharynx is clear and moist.  Eyes: Conjunctivae and EOM are normal. Pupils are equal, round, and reactive to light. No scleral icterus.  Neck: Normal range of motion. Neck supple.  Cardiovascular: Normal rate, regular rhythm and normal heart sounds.   Pulmonary/Chest: Effort normal and breath sounds normal. No respiratory distress. She has no wheezes. She has no rales.  Abdominal: Soft. Bowel sounds are normal. She exhibits no distension and no mass. There is no tenderness. There is no rebound and no guarding.  Genitourinary: Rectum normal. Rectal exam shows no external hemorrhoid, no fissure, no mass, no tenderness and anal tone normal. Guaiac negative stool.  Lymphadenopathy:    She has no cervical adenopathy.  Neurological: She is alert.  Skin: Skin is warm and dry. No pallor.  Psychiatric: She has a normal mood and affect.          Assessment & Plan:   Problem List Items Addressed This Visit      Other   Dark stools - Primary    Brief/ 1 episode suspect from a pepto bismol dose the day before No further problems  Heme neg stool today  Will continue to follow  Will update if symptoms return (w/o pepto) Continue PPI for gerd

## 2014-01-27 ENCOUNTER — Other Ambulatory Visit: Payer: Self-pay | Admitting: Family Medicine

## 2014-02-21 DIAGNOSIS — H1045 Other chronic allergic conjunctivitis: Secondary | ICD-10-CM | POA: Diagnosis not present

## 2014-02-21 DIAGNOSIS — J4530 Mild persistent asthma, uncomplicated: Secondary | ICD-10-CM | POA: Diagnosis not present

## 2014-02-21 DIAGNOSIS — J300 Vasomotor rhinitis: Secondary | ICD-10-CM | POA: Diagnosis not present

## 2014-02-25 ENCOUNTER — Other Ambulatory Visit: Payer: Self-pay

## 2014-02-25 DIAGNOSIS — Z1231 Encounter for screening mammogram for malignant neoplasm of breast: Secondary | ICD-10-CM

## 2014-03-18 ENCOUNTER — Ambulatory Visit
Admission: RE | Admit: 2014-03-18 | Discharge: 2014-03-18 | Disposition: A | Discharge status: Home / Self Care | Payer: Medicare Other | Source: Ambulatory Visit

## 2014-03-18 DIAGNOSIS — Z1231 Encounter for screening mammogram for malignant neoplasm of breast: Secondary | ICD-10-CM

## 2014-03-28 ENCOUNTER — Encounter: Payer: Self-pay | Admitting: *Deleted

## 2014-08-13 ENCOUNTER — Other Ambulatory Visit: Payer: Self-pay | Admitting: Family Medicine

## 2014-08-13 NOTE — Telephone Encounter (Signed)
Please schedule PE when able to get in (winter is ok) and refill until then

## 2014-08-13 NOTE — Telephone Encounter (Signed)
appt scheduled and med refilled 

## 2014-08-13 NOTE — Telephone Encounter (Signed)
No recent f/u or CPE only an acute appt on 01/09/14, and no future appt, please advise

## 2014-10-07 DIAGNOSIS — L720 Epidermal cyst: Secondary | ICD-10-CM | POA: Diagnosis not present

## 2014-10-07 DIAGNOSIS — D1801 Hemangioma of skin and subcutaneous tissue: Secondary | ICD-10-CM | POA: Diagnosis not present

## 2014-10-07 DIAGNOSIS — L821 Other seborrheic keratosis: Secondary | ICD-10-CM | POA: Diagnosis not present

## 2014-10-07 DIAGNOSIS — Z85828 Personal history of other malignant neoplasm of skin: Secondary | ICD-10-CM | POA: Diagnosis not present

## 2014-10-07 DIAGNOSIS — L723 Sebaceous cyst: Secondary | ICD-10-CM | POA: Diagnosis not present

## 2014-12-31 ENCOUNTER — Telehealth: Payer: Self-pay | Admitting: Family Medicine

## 2014-12-31 NOTE — Telephone Encounter (Signed)
If she feels totally better and cough is not productive /no fever -a flu shot is ok  Otherwise see me first please

## 2014-12-31 NOTE — Telephone Encounter (Signed)
Pt notified of Dr. Marliss Coots comments. Pt was unsure about sxs and not sure if she stills has a fever so appt scheduled with Dr. Glori Bickers tomorrow

## 2014-12-31 NOTE — Telephone Encounter (Signed)
Pt stopped in asking about getting a flu injection, but she has been sick earlier this month. She was running a fever of 102, she says that has gone away but still has the cough. I suggested scheduling a appointment with Dr. Glori Bickers and then getting a flu shot the same day, but she wanted to see if Dr. Glori Bickers would just agree to a flu injection without a appointment.   CB# [phone]

## 2015-01-01 ENCOUNTER — Encounter: Payer: Self-pay | Admitting: Family Medicine

## 2015-01-01 ENCOUNTER — Ambulatory Visit (INDEPENDENT_AMBULATORY_CARE_PROVIDER_SITE_OTHER): Payer: Medicare Other | Admitting: Family Medicine

## 2015-01-01 VITALS — BP 142/82 | HR 76 | Temp 98.4°F | Ht 60.25 in | Wt 145.0 lb

## 2015-01-01 DIAGNOSIS — R058 Other specified cough: Secondary | ICD-10-CM

## 2015-01-01 DIAGNOSIS — Z23 Encounter for immunization: Secondary | ICD-10-CM

## 2015-01-01 DIAGNOSIS — R05 Cough: Secondary | ICD-10-CM

## 2015-01-01 MED ORDER — HYDROCODONE-HOMATROPINE 5-1.5 MG/5ML PO SYRP: 5.0000 mL | ORAL_SOLUTION | Freq: Every evening | ORAL | Status: DC | PRN

## 2015-01-01 MED ORDER — BENZONATATE 200 MG PO CAPS: 200.0000 mg | ORAL_CAPSULE | Freq: Three times a day (TID) | ORAL | Status: DC | PRN

## 2015-01-01 NOTE — Progress Notes (Signed)
Pre visit review using our clinic review tool, if applicable. No additional management support is needed unless otherwise documented below in the visit note. 

## 2015-01-01 NOTE — Assessment & Plan Note (Signed)
After uri  Other symptoms improved tx with tessalon tid and hycodan at night to stop the cough cycle  Update if not starting to improve in a week or if worsening  -esp if fever or more prod cough  Flu shot ok today

## 2015-01-01 NOTE — Progress Notes (Signed)
Subjective:    Patient ID: Erika Evans, female    DOB: 1934-04-25, 80 y.o.   MRN: [id number]  HPI Here for a recent uri - wants to make sure she can have a flu shot   Started mid oct  Temp was high - fever and chills in the beginning (better now) No n/v/d  Mostly improved but cannot quit coughing -has fits of it with a tickle in her throat Clear mucous (was yellow earlier)   Tussin otc for coug - helps just a little Using cough drops  Throat is dry and hoarse   Was wheezing- not now   Patient Active Problem List   Diagnosis Date Noted  . Dark stools 01/09/2014  . Lymph node symptom 10/24/2013  . Fever 08/03/2013  . Acute bronchitis with bronchospasm 03/05/2013  . Encounter for Medicare annual wellness exam 11/26/2012  . GERD (gastroesophageal reflux disease) 04/21/2011  . OA (osteoarthritis) 04/21/2011  . Hyperlipidemia 04/21/2011  . Osteopenia 04/21/2011  . Overweight(278.02) 04/21/2011   Past Medical History  Diagnosis Date  . GERD (gastroesophageal reflux disease)   . Osteoarthritis   . Pneumonia 2004  . Foot pain     Left; nerve problem; procedure  . Hyperlipidemia     Mild  . Osteopenia   . Hepatitis B carrier     Positive hep B test at [facility]  . Elevated liver enzymes    Past Surgical History  Procedure Laterality Date  . Partial hysterectomy    . Back surgery    . "blocked kidney"    . Dexa      Osteopenis 9/01, improved 10/03, decreased BMD 5/06  . US echocardiography  7/03    Abd-normal  . Colonoscopy  9/06    Diverticulosis, hems  . US renal/aorta  2007    Neg  . Cardiovascular stress test  9/07    Neg   Social History  Substance Use Topics  . Smoking status: Never Smoker   . Smokeless tobacco: None  . Alcohol Use: No   Family History  Problem Relation Age of Onset  . Lung cancer Father   . Cancer Sister     Bladder  . Heart attack Brother   . Leukemia Brother   . Stroke Brother    Allergies  Allergen Reactions  .  Alendronate Sodium     REACTION: acid reflux  . Raloxifene     REACTION: severe muscle pain and weakness   Current Outpatient Prescriptions on File Prior to Visit  Medication Sig Dispense Refill  . Bepotastine Besilate (BEPREVE) 1.5 % SOLN Place 1 drop into both eyes as needed.    . Calcium Carbonate-Vitamin D (CALCIUM 600 + D PO) Take 1 tablet by mouth once a week.     . Cholecalciferol (VITAMIN D-3) 1000 UNITS CAPS Take 2 capsules by mouth daily.    . fish oil-omega-3 fatty acids 1000 MG capsule Take 1 g by mouth 2 (two) times daily.     . Fluticasone-Salmeterol (ADVAIR DISKUS) 250-50 MCG/DOSE AEPB Inhale 1 puff into the lungs every 12 (twelve) hours.     . Glucosamine-Chondroit-Vit C-Mn (GLUCOSAMINE 1500 COMPLEX PO) Take 1 capsule by mouth daily. Take as directed.    Marland Kitchen levocetirizine (XYZAL) 5 MG tablet Take 5 mg by mouth at bedtime.    . montelukast (SINGULAIR) 10 MG tablet Take 10 mg by mouth daily.      Marland Kitchen omeprazole (PRILOSEC) 20 MG capsule TAKE 1 CAPSULE DAILY 90 capsule  1  . aspirin 81 MG tablet Take 81 mg by mouth daily.       No current facility-administered medications on file prior to visit.    Review of Systems Review of Systems  Constitutional: Negative for fever, appetite change, fatigue and unexpected weight change.  ENT pos for post nasal drip/neg for facial pain or st  Eyes: Negative for pain and visual disturbance.  Respiratory: Negative for  shortness of breath.   Cardiovascular: Negative for cp or palpitations    Gastrointestinal: Negative for nausea, diarrhea and constipation.  Genitourinary: Negative for urgency and frequency.  Skin: Negative for pallor or rash   Neurological: Negative for weakness, light-headedness, numbness and headaches.  Hematological: Negative for adenopathy. Does not bruise/bleed easily.  Psychiatric/Behavioral: Negative for dysphoric mood. The patient is not nervous/anxious.         Objective:   Physical Exam  Constitutional: She  appears well-developed and well-nourished. No distress.  overwt and well app  occ dry cough  HENT:  Head: Normocephalic and atraumatic.  Right Ear: External ear normal.  Left Ear: External ear normal.  Mouth/Throat: Oropharynx is clear and moist.  Nares are boggy  No sinus tenderness Clear rhinorrhea and post nasal drip   Eyes: Conjunctivae and EOM are normal. Pupils are equal, round, and reactive to light. Right eye exhibits no discharge. Left eye exhibits no discharge.  Neck: Normal range of motion. Neck supple.  Cardiovascular: Normal rate and normal heart sounds.   Pulmonary/Chest: Effort normal and breath sounds normal. No respiratory distress. She has no wheezes. She has no rales. She exhibits no tenderness.  Good air exch No wheeze even on forced exp  Lymphadenopathy:    She has no cervical adenopathy.  Neurological: She is alert.  Skin: Skin is warm and dry. No rash noted.  Psychiatric: She has a normal mood and affect.          Assessment & Plan:   Problem List Items Addressed This Visit      Other   Post-viral cough syndrome - Primary    After uri  Other symptoms improved tx with tessalon tid and hycodan at night to stop the cough cycle  Update if not starting to improve in a week or if worsening  -esp if fever or more prod cough  Flu shot ok today       Other Visit Diagnoses    Encounter for immunization

## 2015-01-01 NOTE — Patient Instructions (Signed)
I think you have a post viral cough syndrome Take tessalon three times daily  Use the hycodan at night - watch out for sedation  Drink lots of fluids  Tussin is ok over the counter    Update if not starting to improve in a week or if worsening  - especially if short of breath or fever   Flu shot today

## 2015-01-02 DIAGNOSIS — H524 Presbyopia: Secondary | ICD-10-CM | POA: Diagnosis not present

## 2015-01-02 DIAGNOSIS — H00029 Hordeolum internum unspecified eye, unspecified eyelid: Secondary | ICD-10-CM | POA: Diagnosis not present

## 2015-01-02 DIAGNOSIS — H2513 Age-related nuclear cataract, bilateral: Secondary | ICD-10-CM | POA: Diagnosis not present

## 2015-01-28 ENCOUNTER — Ambulatory Visit (INDEPENDENT_AMBULATORY_CARE_PROVIDER_SITE_OTHER): Payer: Medicare Other | Admitting: Family Medicine

## 2015-01-28 ENCOUNTER — Encounter: Payer: Self-pay | Admitting: Family Medicine

## 2015-01-28 VITALS — BP 116/68 | HR 73 | Temp 98.0°F | Ht 60.5 in | Wt 145.8 lb

## 2015-01-28 DIAGNOSIS — E785 Hyperlipidemia, unspecified: Secondary | ICD-10-CM

## 2015-01-28 DIAGNOSIS — Z23 Encounter for immunization: Secondary | ICD-10-CM | POA: Diagnosis not present

## 2015-01-28 DIAGNOSIS — Z1211 Encounter for screening for malignant neoplasm of colon: Secondary | ICD-10-CM | POA: Insufficient documentation

## 2015-01-28 DIAGNOSIS — Z0000 Encounter for general adult medical examination without abnormal findings: Secondary | ICD-10-CM | POA: Diagnosis not present

## 2015-01-28 DIAGNOSIS — E2839 Other primary ovarian failure: Secondary | ICD-10-CM | POA: Insufficient documentation

## 2015-01-28 DIAGNOSIS — M8580 Other specified disorders of bone density and structure, unspecified site: Secondary | ICD-10-CM

## 2015-01-28 LAB — CBC WITH DIFFERENTIAL/PLATELET
BASOS PCT: 0.2 % (ref 0.0–3.0)
Basophils Absolute: 0 10*3/uL (ref 0.0–0.1)
Eosinophils Absolute: 0.1 10*3/uL (ref 0.0–0.7)
Eosinophils Relative: 1.7 % (ref 0.0–5.0)
HCT: 40.4 % (ref 36.0–46.0)
HEMOGLOBIN: 13.1 g/dL (ref 12.0–15.0)
LYMPHS ABS: 3.3 10*3/uL (ref 0.7–4.0)
Lymphocytes Relative: 38 % (ref 12.0–46.0)
MCHC: 32.5 g/dL (ref 30.0–36.0)
MCV: 90.7 fl (ref 78.0–100.0)
MONO ABS: 0.8 10*3/uL (ref 0.1–1.0)
Monocytes Relative: 9.3 % (ref 3.0–12.0)
NEUTROS ABS: 4.4 10*3/uL (ref 1.4–7.7)
Neutrophils Relative %: 50.8 % (ref 43.0–77.0)
PLATELETS: 205 10*3/uL (ref 150.0–400.0)
RBC: 4.46 Mil/uL (ref 3.87–5.11)
RDW: 14 % (ref 11.5–15.5)
WBC: 8.7 10*3/uL (ref 4.0–10.5)

## 2015-01-28 LAB — COMPREHENSIVE METABOLIC PANEL WITH GFR
ALT: 18 U/L (ref 0–35)
AST: 21 U/L (ref 0–37)
Albumin: 3.9 g/dL (ref 3.5–5.2)
Alkaline Phosphatase: 85 U/L (ref 39–117)
BUN: 14 mg/dL (ref 6–23)
CO2: 33 meq/L — ABNORMAL HIGH (ref 19–32)
Calcium: 9.9 mg/dL (ref 8.4–10.5)
Chloride: 102 meq/L (ref 96–112)
Creatinine, Ser: 0.72 mg/dL (ref 0.40–1.20)
GFR: 83.15 mL/min
Glucose, Bld: 88 mg/dL (ref 70–99)
Potassium: 4.2 meq/L (ref 3.5–5.1)
Sodium: 142 meq/L (ref 135–145)
Total Bilirubin: 0.7 mg/dL (ref 0.2–1.2)
Total Protein: 7.6 g/dL (ref 6.0–8.3)

## 2015-01-28 LAB — LIPID PANEL
CHOLESTEROL: 209 mg/dL — AB (ref 0–200)
HDL: 62.4 mg/dL (ref >39.00)
LDL Cholesterol: 127 mg/dL — ABNORMAL HIGH (ref 0–99)
NONHDL: 146.4
Total CHOL/HDL Ratio: 3
Triglycerides: 96 mg/dL (ref 0.0–149.0)
VLDL: 19.2 mg/dL (ref 0.0–40.0)

## 2015-01-28 LAB — TSH: TSH: 1.63 u[IU]/mL (ref 0.35–4.50)

## 2015-01-28 MED ORDER — ALBUTEROL SULFATE HFA 108 (90 BASE) MCG/ACT IN AERS: 2.0000 | INHALATION_SPRAY | RESPIRATORY_TRACT | Status: DC | PRN

## 2015-01-28 NOTE — Assessment & Plan Note (Signed)
dexa 2013- due for 2 year f/u  No falls or fx On ca and D Disc need for calcium/ vitamin D/ wt bearing exercise and bone density test every 2 y to monitor Disc safety/ fracture risk in detail

## 2015-01-28 NOTE — Patient Instructions (Signed)
Try to find out what clotting disorder (hypercoaguable state) your family has a history of  and we can test you for it  Continue to take 81 mg aspirin daily  Labs today  prevnar vaccine today  Do the stool kit for colon screening  Take 3000 units of vitamin D over the counter daily  Stop at check out for referral for a bone density test

## 2015-01-28 NOTE — Progress Notes (Signed)
Pre visit review using our clinic review tool, if applicable. No additional management support is needed unless otherwise documented below in the visit note. 

## 2015-01-28 NOTE — Assessment & Plan Note (Signed)
Lipid panel today Last time both HDL and LDL went up  Rev low sat fat diet-thinks she does fairly well Disc imp of exercise

## 2015-01-28 NOTE — Assessment & Plan Note (Signed)
IFOB kit given  Not sure when last colonoscopy was  Now over 39

## 2015-01-28 NOTE — Progress Notes (Signed)
Subjective:    Patient ID: Erika Evans, female    DOB: [birth year], 80 y.o.   MRN: [id number]  HPI Here for annual medicare wellness visit as well as chronic/acute medical problems as well as annual prev exam   I have personally reviewed the Medicare Annual Wellness questionnaire and have noted 1. The patient's medical and social history 2. Their use of alcohol, tobacco or illicit drugs 3. Their current medications and supplements 4. The patient's functional ability including ADL's, fall risks, home safety risks and hearing or visual             impairment. 5. Diet and physical activities 6. Evidence for depression or mood disorders  The patients weight, height, BMI have been recorded in the chart and visual acuity is per eye clinic.  I have made referrals, counseling and provided education to the patient based review of the above and I have provided the pt with a written personalized care plan for preventive services. Reviewed and updated provider list, see scanned forms.  Doing well - feeling ok overall  Still working/ out in the yard - got pinched by her loppers on L breast   Helping her sister - who has recently had several blood clots   See scanned forms.  Routine anticipatory guidance given to patient.  See health maintenance. Colon cancer screening - last colonoscopy "a long time ago"- cannot remb when, will do IFOB kit  Breast cancer screening 11/16-nl  Self breast exam-no lumps or changes  Flu vaccine  11/16  Tetanus vaccine 2/13  Pneumovax 2/13 , will get a prevnar today  Zoster vaccine 10/15  dexa 3/13 , no falls or fractures , she does not tolerate calcium but takes vitamin D - is interested in another dexa  Advance directive has a living will and power of attorney  Cognitive function addressed- see scanned forms- and if abnormal then additional documentation follows.  occ misplaces things -no real problems   PMH and SH reviewed  Meds, vitals, and allergies  reviewed.   ROS: See HPI.  Otherwise negative.    Hyperlipidemia  Diet controlled Lab Results  Component Value Date   CHOL 214* 11/27/2012   CHOL 198 05/26/2012   CHOL 233* 11/25/2011   Lab Results  Component Value Date   HDL 66.60 11/27/2012   HDL 52.40 05/26/2012   HDL 59.50 11/25/2011   Lab Results  Component Value Date   LDLCALC 126* 05/26/2012   LDLCALC 103* 05/04/2010   LDLCALC 116* 05/25/2007   Lab Results  Component Value Date   TRIG 103.0 11/27/2012   TRIG 98.0 05/26/2012   TRIG 74.0 11/25/2011   Lab Results  Component Value Date   CHOLHDL 3 11/27/2012   CHOLHDL 4 05/26/2012   CHOLHDL 4 11/25/2011   Lab Results  Component Value Date   LDLDIRECT 123.5 11/27/2012   LDLDIRECT 146.1 11/25/2011   LDLDIRECT 144.2 04/21/2011    Curious re: how it will look this time  Did have a holiday dinner recently - but most of the time avoids fatty foods and does not cook it  occ bacon -not often at all  Chooses non sat fats to cook with when necessary  Very active  Needs to walk more for exercise - time is the issue  Likes to work hard outside   Hearing test showed problems with high pitched sounds  Only has problems with people who talk fast Not interested in hearing aide at this point  Patient Active Problem List   Diagnosis Date Noted  . Routine general medical examination at a health care facility 01/28/2015  . Post-viral cough syndrome 01/01/2015  . Dark stools 01/09/2014  . Lymph node symptom 10/24/2013  . Fever 08/03/2013  . Encounter for Medicare annual wellness exam 11/26/2012  . GERD (gastroesophageal reflux disease) 04/21/2011  . OA (osteoarthritis) 04/21/2011  . Hyperlipidemia 04/21/2011  . Osteopenia 04/21/2011  . Overweight(278.02) 04/21/2011   Past Medical History  Diagnosis Date  . GERD (gastroesophageal reflux disease)   . Osteoarthritis   . Pneumonia 2004  . Foot pain     Left; nerve problem; procedure  . Hyperlipidemia     Mild    . Osteopenia   . Hepatitis B carrier     Positive hep B test at [facility]  . Elevated liver enzymes    Past Surgical History  Procedure Laterality Date  . Partial hysterectomy    . Back surgery    . "blocked kidney"    . Dexa      Osteopenis 9/01, improved 10/03, decreased BMD 5/06  . US echocardiography  7/03    Abd-normal  . Colonoscopy  9/06    Diverticulosis, hems  . US renal/aorta  2007    Neg  . Cardiovascular stress test  9/07    Neg   Social History  Substance Use Topics  . Smoking status: Never Smoker   . Smokeless tobacco: None  . Alcohol Use: No   Family History  Problem Relation Age of Onset  . Lung cancer Father   . Cancer Sister     Bladder  . Heart attack Brother   . Leukemia Brother   . Stroke Brother    Allergies  Allergen Reactions  . Alendronate Sodium     REACTION: acid reflux  . Raloxifene     REACTION: severe muscle pain and weakness   Current Outpatient Prescriptions on File Prior to Visit  Medication Sig Dispense Refill  . aspirin 81 MG tablet Take 81 mg by mouth daily.      . Bepotastine Besilate (BEPREVE) 1.5 % SOLN Place 1 drop into both eyes as needed.    . Calcium Carbonate-Vitamin D (CALCIUM 600 + D PO) Take 1 tablet by mouth once a week.     . Cholecalciferol (VITAMIN D-3) 1000 UNITS CAPS Take 2 capsules by mouth daily.    . fish oil-omega-3 fatty acids 1000 MG capsule Take 1 g by mouth 2 (two) times daily.     . Fluticasone-Salmeterol (ADVAIR DISKUS) 250-50 MCG/DOSE AEPB Inhale 1 puff into the lungs every 12 (twelve) hours.     Marland Kitchen levocetirizine (XYZAL) 5 MG tablet Take 5 mg by mouth at bedtime.    . montelukast (SINGULAIR) 10 MG tablet Take 10 mg by mouth daily.      . Naproxen Sodium (ALEVE) 220 MG CAPS Take by mouth daily as needed.    Marland Kitchen omeprazole (PRILOSEC) 20 MG capsule TAKE 1 CAPSULE DAILY 90 capsule 1   No current facility-administered medications on file prior to visit.     Review of Systems Review of Systems   Constitutional: Negative for fever, appetite change, fatigue and unexpected weight change.  Eyes: Negative for pain and visual disturbance.  ENT pos for hearing loss that is not bothersome at this time  Respiratory: Negative for cough and shortness of breath.   Cardiovascular: Negative for cp or palpitations    Gastrointestinal: Negative for nausea, diarrhea and constipation.  Genitourinary:  Negative for urgency and frequency.  Skin: Negative for pallor or rash   Neurological: Negative for weakness, light-headedness, numbness and headaches.  Hematological: Negative for adenopathy. Does not bruise/bleed easily.  Psychiatric/Behavioral: Negative for dysphoric mood. The patient is not nervous/anxious.         Objective:   Physical Exam  Constitutional: She appears well-developed and well-nourished. No distress.  overwt and well appearing   HENT:  Head: Normocephalic and atraumatic.  Right Ear: External ear normal.  Left Ear: External ear normal.  Mouth/Throat: Oropharynx is clear and moist.  Eyes: Conjunctivae and EOM are normal. Pupils are equal, round, and reactive to light. No scleral icterus.  Neck: Normal range of motion. Neck supple. No JVD present. Carotid bruit is not present. No thyromegaly present.  Cardiovascular: Normal rate, regular rhythm, normal heart sounds and intact distal pulses.  Exam reveals no gallop.   Pulmonary/Chest: Effort normal and breath sounds normal. No respiratory distress. She has no wheezes. She exhibits no tenderness.  Abdominal: Soft. Bowel sounds are normal. She exhibits no distension, no abdominal bruit and no mass. There is no tenderness.  Genitourinary: No breast swelling, tenderness, discharge or bleeding.  Breast exam: No mass, nodules, thickening, tenderness, bulging, retraction, inflamation, nipple discharge or skin changes noted.  No axillary or clavicular LA.      Small bruise noted on L lateral breast that is resolving  Musculoskeletal:  Normal range of motion. She exhibits no edema or tenderness.  Lymphadenopathy:    She has no cervical adenopathy.  Neurological: She is alert. She has normal reflexes. No cranial nerve deficit. She exhibits normal muscle tone. Coordination normal.  Skin: Skin is warm and dry. No rash noted. No erythema. No pallor.  Psychiatric: She has a normal mood and affect.  Nursing note and vitals reviewed.         Assessment & Plan:   Problem List Items Addressed This Visit      Musculoskeletal and Integument   Osteopenia    dexa 2013- due for 2 year f/u  No falls or fx On ca and D Disc need for calcium/ vitamin D/ wt bearing exercise and bone density test every 2 y to monitor Disc safety/ fracture risk in detail            Other   Encounter for Medicare annual wellness exam - Primary    Also reviewed health mt list, fam hx and immunization status , as well as social and family history   See HPI Labs today Try to find out what clotting disorder (hypercoaguable state) your family has a history of  and we can test you for it  Continue to take 81 mg aspirin daily  Labs today  prevnar vaccine today  Do the stool kit for colon screening  Take 3000 units of vitamin D over the counter daily  Stop at check out for referral for a bone density test       Estrogen deficiency   Relevant Orders   DG Bone Density   Hyperlipidemia    Lipid panel today Last time both HDL and LDL went up  Rev low sat fat diet-thinks she does fairly well Disc imp of exercise       Relevant Orders   CBC with Differential/Platelet (Completed)   Comprehensive metabolic panel (Completed)   Lipid panel (Completed)   Routine general medical examination at a health care facility    Reviewed health habits including diet and exercise and skin cancer prevention  Reviewed appropriate screening tests for age  Also reviewed health mt list, fam hx and immunization status , as well as social and family history   See  HPI Labs today Try to find out what clotting disorder (hypercoaguable state) your family has a history of  and we can test you for it  Continue to take 81 mg aspirin daily  Labs today  prevnar vaccine today  Do the stool kit for colon screening  Take 3000 units of vitamin D over the counter daily  Stop at check out for referral for a bone density test       Relevant Orders   CBC with Differential/Platelet (Completed)   Comprehensive metabolic panel (Completed)   TSH (Completed)   Lipid panel (Completed)   Screening for colon cancer    IFOB kit given  Not sure when last colonoscopy was  Now over 77         Relevant Orders   Fecal occult blood, imunochemical    Other Visit Diagnoses    Need for vaccination with 13-polyvalent pneumococcal conjugate vaccine        Relevant Orders    Pneumococcal conjugate vaccine 13-valent (Completed)

## 2015-01-28 NOTE — Assessment & Plan Note (Signed)
Also reviewed health mt list, fam hx and immunization status , as well as social and family history   See HPI Labs today Try to find out what clotting disorder (hypercoaguable state) your family has a history of  and we can test you for it  Continue to take 81 mg aspirin daily  Labs today  prevnar vaccine today  Do the stool kit for colon screening  Take 3000 units of vitamin D over the counter daily  Stop at check out for referral for a bone density test

## 2015-01-28 NOTE — Assessment & Plan Note (Signed)
Reviewed health habits including diet and exercise and skin cancer prevention Reviewed appropriate screening tests for age  Also reviewed health mt list, fam hx and immunization status , as well as social and family history   See HPI Labs today Try to find out what clotting disorder (hypercoaguable state) your family has a history of  and we can test you for it  Continue to take 81 mg aspirin daily  Labs today  prevnar vaccine today  Do the stool kit for colon screening  Take 3000 units of vitamin D over the counter daily  Stop at check out for referral for a bone density test  

## 2015-01-29 ENCOUNTER — Encounter: Payer: Self-pay | Admitting: *Deleted

## 2015-02-03 ENCOUNTER — Encounter: Payer: Self-pay | Admitting: Internal Medicine

## 2015-02-04 ENCOUNTER — Ambulatory Visit (INDEPENDENT_AMBULATORY_CARE_PROVIDER_SITE_OTHER)
Admission: RE | Admit: 2015-02-04 | Discharge: 2015-02-04 | Disposition: A | Discharge status: Home / Self Care | Payer: Medicare Other | Source: Ambulatory Visit | Attending: Family Medicine | Admitting: Family Medicine

## 2015-02-04 ENCOUNTER — Other Ambulatory Visit: Payer: Self-pay | Admitting: Family Medicine

## 2015-02-04 ENCOUNTER — Encounter: Payer: Self-pay | Admitting: Internal Medicine

## 2015-02-04 DIAGNOSIS — E2839 Other primary ovarian failure: Secondary | ICD-10-CM | POA: Diagnosis not present

## 2015-02-04 LAB — HM DEXA SCAN

## 2015-02-12 ENCOUNTER — Encounter: Payer: Self-pay | Admitting: Family Medicine

## 2015-02-12 ENCOUNTER — Encounter: Payer: Self-pay | Admitting: *Deleted

## 2015-02-18 ENCOUNTER — Other Ambulatory Visit (INDEPENDENT_AMBULATORY_CARE_PROVIDER_SITE_OTHER): Payer: Medicare Other

## 2015-02-18 DIAGNOSIS — Z1211 Encounter for screening for malignant neoplasm of colon: Secondary | ICD-10-CM | POA: Diagnosis not present

## 2015-02-18 LAB — FECAL OCCULT BLOOD, IMMUNOCHEMICAL: FECAL OCCULT BLD: NEGATIVE

## 2015-02-20 DIAGNOSIS — J300 Vasomotor rhinitis: Secondary | ICD-10-CM | POA: Diagnosis not present

## 2015-02-20 DIAGNOSIS — H1045 Other chronic allergic conjunctivitis: Secondary | ICD-10-CM | POA: Diagnosis not present

## 2015-02-20 DIAGNOSIS — J4530 Mild persistent asthma, uncomplicated: Secondary | ICD-10-CM | POA: Diagnosis not present

## 2015-02-21 ENCOUNTER — Telehealth: Payer: Self-pay | Admitting: Family Medicine

## 2015-02-21 NOTE — Telephone Encounter (Signed)
Patient returned Erika Evans's call. I let her know her stool cards were negative.

## 2015-03-24 ENCOUNTER — Other Ambulatory Visit: Payer: Self-pay

## 2015-03-24 DIAGNOSIS — Z1231 Encounter for screening mammogram for malignant neoplasm of breast: Secondary | ICD-10-CM

## 2015-04-11 ENCOUNTER — Ambulatory Visit
Admission: RE | Admit: 2015-04-11 | Discharge: 2015-04-11 | Disposition: A | Discharge status: Home / Self Care | Payer: Medicare Other | Source: Ambulatory Visit

## 2015-04-11 DIAGNOSIS — Z1231 Encounter for screening mammogram for malignant neoplasm of breast: Secondary | ICD-10-CM

## 2015-04-11 LAB — HM MAMMOGRAPHY: HM Mammogram: NORMAL

## 2015-04-16 ENCOUNTER — Encounter: Payer: Self-pay | Admitting: Family Medicine

## 2015-04-16 ENCOUNTER — Encounter: Payer: Self-pay | Admitting: *Deleted

## 2015-12-04 ENCOUNTER — Telehealth: Payer: Self-pay | Admitting: Family Medicine

## 2015-12-04 DIAGNOSIS — E785 Hyperlipidemia, unspecified: Secondary | ICD-10-CM

## 2015-12-04 DIAGNOSIS — Z0000 Encounter for general adult medical examination without abnormal findings: Secondary | ICD-10-CM

## 2015-12-04 NOTE — Telephone Encounter (Signed)
-----   Message from Ellamae Sia sent at 12/02/2015  4:11 PM EDT ----- Regarding: Lab orders for Monday, 10.2.17 Patient is scheduled for CPX labs, please order future labs, Thanks , Karna Christmas

## 2015-12-08 ENCOUNTER — Other Ambulatory Visit (INDEPENDENT_AMBULATORY_CARE_PROVIDER_SITE_OTHER): Payer: Medicare Other

## 2015-12-08 DIAGNOSIS — Z0000 Encounter for general adult medical examination without abnormal findings: Secondary | ICD-10-CM | POA: Diagnosis not present

## 2015-12-08 DIAGNOSIS — E785 Hyperlipidemia, unspecified: Secondary | ICD-10-CM

## 2015-12-08 LAB — LIPID PANEL
CHOL/HDL RATIO: 3
Cholesterol: 201 mg/dL — ABNORMAL HIGH (ref 0–200)
HDL: 71.4 mg/dL (ref >39.00)
LDL Cholesterol: 118 mg/dL — ABNORMAL HIGH (ref 0–99)
NONHDL: 129.24
Triglycerides: 56 mg/dL (ref 0.0–149.0)
VLDL: 11.2 mg/dL (ref 0.0–40.0)

## 2015-12-08 LAB — CBC WITH DIFFERENTIAL/PLATELET
BASOS ABS: 0 10*3/uL (ref 0.0–0.1)
Basophils Relative: 0.4 % (ref 0.0–3.0)
EOS ABS: 0.3 10*3/uL (ref 0.0–0.7)
Eosinophils Relative: 2.7 % (ref 0.0–5.0)
HCT: 40.1 % (ref 36.0–46.0)
Hemoglobin: 13.2 g/dL (ref 12.0–15.0)
LYMPHS ABS: 3.1 10*3/uL (ref 0.7–4.0)
Lymphocytes Relative: 32 % (ref 12.0–46.0)
MCHC: 32.9 g/dL (ref 30.0–36.0)
MCV: 90.8 fl (ref 78.0–100.0)
MONO ABS: 0.8 10*3/uL (ref 0.1–1.0)
MONOS PCT: 8.1 % (ref 3.0–12.0)
NEUTROS ABS: 5.5 10*3/uL (ref 1.4–7.7)
NEUTROS PCT: 56.8 % (ref 43.0–77.0)
PLATELETS: 211 10*3/uL (ref 150.0–400.0)
RBC: 4.41 Mil/uL (ref 3.87–5.11)
RDW: 14.2 % (ref 11.5–15.5)
WBC: 9.7 10*3/uL (ref 4.0–10.5)

## 2015-12-08 LAB — COMPREHENSIVE METABOLIC PANEL
ALK PHOS: 87 U/L (ref 39–117)
ALT: 28 U/L (ref 0–35)
AST: 36 U/L (ref 0–37)
Albumin: 3.9 g/dL (ref 3.5–5.2)
BILIRUBIN TOTAL: 0.4 mg/dL (ref 0.2–1.2)
BUN: 18 mg/dL (ref 6–23)
CO2: 33 meq/L — AB (ref 19–32)
Calcium: 9.2 mg/dL (ref 8.4–10.5)
Chloride: 102 mEq/L (ref 96–112)
Creatinine, Ser: 0.68 mg/dL (ref 0.40–1.20)
GFR: 88.63 mL/min (ref >60.00)
GLUCOSE: 73 mg/dL (ref 70–99)
Potassium: 3.8 mEq/L (ref 3.5–5.1)
SODIUM: 142 meq/L (ref 135–145)
TOTAL PROTEIN: 7.5 g/dL (ref 6.0–8.3)

## 2015-12-08 LAB — TSH: TSH: 2.4 u[IU]/mL (ref 0.35–4.50)

## 2015-12-08 NOTE — Addendum Note (Signed)
Addended by: Ellamae Sia on: 12/08/2015 02:11 PM   Modules accepted: Orders

## 2015-12-12 ENCOUNTER — Encounter: Payer: Self-pay | Admitting: Family Medicine

## 2015-12-12 ENCOUNTER — Ambulatory Visit (INDEPENDENT_AMBULATORY_CARE_PROVIDER_SITE_OTHER): Payer: Medicare Other | Admitting: Family Medicine

## 2015-12-12 VITALS — BP 116/64 | HR 76 | Temp 97.7°F | Ht 60.25 in | Wt 149.2 lb

## 2015-12-12 DIAGNOSIS — Z0000 Encounter for general adult medical examination without abnormal findings: Secondary | ICD-10-CM

## 2015-12-12 DIAGNOSIS — M8580 Other specified disorders of bone density and structure, unspecified site: Secondary | ICD-10-CM

## 2015-12-12 DIAGNOSIS — E7800 Pure hypercholesterolemia, unspecified: Secondary | ICD-10-CM | POA: Diagnosis not present

## 2015-12-12 DIAGNOSIS — Z23 Encounter for immunization: Secondary | ICD-10-CM

## 2015-12-12 DIAGNOSIS — Z1211 Encounter for screening for malignant neoplasm of colon: Secondary | ICD-10-CM | POA: Diagnosis not present

## 2015-12-12 DIAGNOSIS — E663 Overweight: Secondary | ICD-10-CM

## 2015-12-12 NOTE — Progress Notes (Signed)
Pre visit review using our clinic review tool, if applicable. No additional management support is needed unless otherwise documented below in the visit note. 

## 2015-12-12 NOTE — Assessment & Plan Note (Signed)
In pt on PPI Rev last dexa 2016  No falls or fractures On vit D  Disc need for calcium/ vitamin D/ wt bearing exercise and bone density test every 2 y to monitor Disc safety/ fracture risk in detail

## 2015-12-12 NOTE — Assessment & Plan Note (Signed)
Discussed how this problem influences overall health and the risks it imposes  Reviewed plan for weight loss with lower calorie diet (via better food choices and also portion control or program like weight watchers) and exercise building up to or more than 30 minutes 5 days per week including some aerobic activity    

## 2015-12-12 NOTE — Assessment & Plan Note (Signed)
Reviewed health habits including diet and exercise and skin cancer prevention Reviewed appropriate screening tests for age  Also reviewed health mt list, fam hx and immunization status , as well as social and family history   See HPI Labs reviewed  AMW will be scheduled  We will sign you up for the cologuard test  Flu shot today  Take care of yourself  Stay active physically and mentally  Make sure you are drinking enough water  Do not climb on ladders  Stop at check out to schedule your medicare interview

## 2015-12-12 NOTE — Assessment & Plan Note (Signed)
Signed up for cologuard program

## 2015-12-12 NOTE — Assessment & Plan Note (Signed)
Disc goals for lipids and reasons to control them Rev labs with pt Rev low sat fat diet in detail Enc good habits  LDL is down a bit and HDL is up -good

## 2015-12-12 NOTE — Progress Notes (Signed)
Subjective:    Patient ID: Erika Evans, female    DOB: May 09, 1934, 81 y.o.   MRN: [id number]  HPI Here for health maintenance exam and to review chronic medical problems  Getting over a uri right now  Feeling better now - stopped up now  Otherwise doing well overall  Stays busy     Will need to schedule her AMW visit   Flu shot - no fever/ will get it today  Mammogram 2/17-normal Self breast exam-no changes or lumps   utd on immunizations   dexa 11/16 -osteopenia  She takes vit D (not tolerant of calcium -constipation)  She drinks almond milk and eats green veggies   IFOB stool kit 12/16 neg Is interested in cologuard   Colonoscopy 9/06 nl   Wt Readings from Last 3 Encounters:  12/12/15 149 lb 4 oz (67.7 kg)  01/28/15 145 lb 12 oz (66.1 kg)  01/01/15 145 lb (65.8 kg)  bmi is 28.9 Wt is up 4 lb  Not enough "exercise" - but she works and stays very active (outdoor work/house work) She has been on a sugar kick-eating too many sweets    Hx of hyperlipidemia Lab Results  Component Value Date   CHOL 201 (H) 12/08/2015   CHOL 209 (H) 01/28/2015   CHOL 214 (H) 11/27/2012   Lab Results  Component Value Date   HDL 71.40 12/08/2015   HDL 62.40 01/28/2015   HDL 66.60 11/27/2012   Lab Results  Component Value Date   LDLCALC 118 (H) 12/08/2015   LDLCALC 127 (H) 01/28/2015   LDLCALC 126 (H) 05/26/2012   Lab Results  Component Value Date   TRIG 56.0 12/08/2015   TRIG 96.0 01/28/2015   TRIG 103.0 11/27/2012   Lab Results  Component Value Date   CHOLHDL 3 12/08/2015   CHOLHDL 3 01/28/2015   CHOLHDL 3 11/27/2012   Lab Results  Component Value Date   LDLDIRECT 123.5 11/27/2012   LDLDIRECT 146.1 11/25/2011   LDLDIRECT 144.2 04/21/2011   Fairly stable - LDL is down a bit  HDL is up  Overall pretty good   Lab Results  Component Value Date   WBC 9.7 12/08/2015   HGB 13.2 12/08/2015   HCT 40.1 12/08/2015   MCV 90.8 12/08/2015   PLT 211.0  12/08/2015     Chemistry      Component Value Date/Time   NA 142 12/08/2015 1410   K 3.8 12/08/2015 1410   CL 102 12/08/2015 1410   CO2 33 (H) 12/08/2015 1410   BUN 18 12/08/2015 1410   CREATININE 0.68 12/08/2015 1410      Component Value Date/Time   CALCIUM 9.2 12/08/2015 1410   ALKPHOS 87 12/08/2015 1410   AST 36 12/08/2015 1410   ALT 28 12/08/2015 1410   BILITOT 0.4 12/08/2015 1410     Lab Results  Component Value Date   TSH 2.40 12/08/2015    Labs are overall stable   Patient Active Problem List   Diagnosis Date Noted  . Routine general medical examination at a health care facility 01/28/2015  . Screening for colon cancer 01/28/2015  . Estrogen deficiency 01/28/2015  . Lymph node symptom 10/24/2013  . Encounter for Medicare annual wellness exam 11/26/2012  . GERD (gastroesophageal reflux disease) 04/21/2011  . OA (osteoarthritis) 04/21/2011  . Hyperlipidemia 04/21/2011  . Osteopenia 04/21/2011  . Overweight 04/21/2011   Past Medical History:  Diagnosis Date  . Elevated liver enzymes   . Foot pain  Left; nerve problem; procedure  . GERD (gastroesophageal reflux disease)   . Hepatitis B carrier ([place])    Positive hep B test at [facility]  . Hyperlipidemia    Mild  . Osteoarthritis   . Osteopenia   . Pneumonia 2004   Past Surgical History:  Procedure Laterality Date  . "Blocked kidney"    . BACK SURGERY    . CARDIOVASCULAR STRESS TEST  9/07   Neg  . COLONOSCOPY  9/06   Diverticulosis, hems  . Dexa     Osteopenis 9/01, improved 10/03, decreased BMD 5/06  . PARTIAL HYSTERECTOMY    . US ECHOCARDIOGRAPHY  7/03   Abd-normal  . US RENAL/AORTA  2007   Neg   Social History  Substance Use Topics  . Smoking status: Never Smoker  . Smokeless tobacco: Never Used  . Alcohol use No   Family History  Problem Relation Age of Onset  . Lung cancer Father   . Cancer Sister     Bladder  . Heart attack Brother   . Leukemia Brother   . Stroke Brother     Allergies  Allergen Reactions  . Alendronate Sodium     REACTION: acid reflux  . Miacalcin [Calcitonin ([place])] Other (See Comments)    Pt thinks the spray gave her a staph infection in her nose   . Raloxifene     REACTION: severe muscle pain and weakness   Current Outpatient Prescriptions on File Prior to Visit  Medication Sig Dispense Refill  . albuterol (PROVENTIL HFA;VENTOLIN HFA) 108 (90 BASE) MCG/ACT inhaler Inhale 2 puffs into the lungs every 4 (four) hours as needed for wheezing or shortness of breath. 1 Inhaler 3  . aspirin 81 MG tablet Take 81 mg by mouth daily.      . Bepotastine Besilate (BEPREVE) 1.5 % SOLN Place 1 drop into both eyes as needed.    . Cholecalciferol (VITAMIN D-3) 1000 UNITS CAPS Take 2 capsules by mouth daily.    . fish oil-omega-3 fatty acids 1000 MG capsule Take 1 g by mouth 2 (two) times daily.     . Fluticasone-Salmeterol (ADVAIR DISKUS) 250-50 MCG/DOSE AEPB Inhale 1 puff into the lungs every 12 (twelve) hours.     Marland Kitchen levocetirizine (XYZAL) 5 MG tablet Take 5 mg by mouth at bedtime.    . Misc Natural Products (GLUCOSAMINE CHONDROITIN VIT D3 PO) Take 1 capsule by mouth daily.    . montelukast (SINGULAIR) 10 MG tablet Take 10 mg by mouth daily.      . Naproxen Sodium (ALEVE) 220 MG CAPS Take by mouth daily as needed.    Marland Kitchen omeprazole (PRILOSEC) 20 MG capsule TAKE 1 CAPSULE DAILY 90 capsule 3   No current facility-administered medications on file prior to visit.      Review of Systems Review of Systems  Constitutional: Negative for fever, appetite change, fatigue and unexpected weight change.  Eyes: Negative for pain and visual disturbance.  Respiratory: Negative for cough and shortness of breath.   Cardiovascular: Negative for cp or palpitations    Gastrointestinal: Negative for nausea, diarrhea and constipation.  Genitourinary: Negative for urgency and frequency.  Skin: Negative for pallor or rash   Neurological: Negative for weakness,  light-headedness, numbness and headaches.  Hematological: Negative for adenopathy. Does not bruise/bleed easily.  Psychiatric/Behavioral: Negative for dysphoric mood. The patient is not nervous/anxious.         Objective:   Physical Exam  Constitutional: She appears well-developed and well-nourished. No  distress.  overwt and well appearing   HENT:  Head: Normocephalic and atraumatic.  Right Ear: External ear normal.  Left Ear: External ear normal.  Mouth/Throat: Oropharynx is clear and moist.  Eyes: Conjunctivae and EOM are normal. Pupils are equal, round, and reactive to light. No scleral icterus.  Neck: Normal range of motion. Neck supple. No JVD present. Carotid bruit is not present. No thyromegaly present.  Cardiovascular: Normal rate, regular rhythm, normal heart sounds and intact distal pulses.  Exam reveals no gallop.   Pulmonary/Chest: Effort normal and breath sounds normal. No respiratory distress. She has no wheezes. She exhibits no tenderness.  Abdominal: Soft. Bowel sounds are normal. She exhibits no distension, no abdominal bruit and no mass. There is no tenderness.  Genitourinary: No breast swelling, tenderness, discharge or bleeding.  Genitourinary Comments: Breast exam: No mass, nodules, thickening, tenderness, bulging, retraction, inflamation, nipple discharge or skin changes noted.  No axillary or clavicular LA.      Musculoskeletal: Normal range of motion. She exhibits no edema or tenderness.  No kyphosis   Lymphadenopathy:    She has no cervical adenopathy.  Neurological: She is alert. She has normal reflexes. No cranial nerve deficit. She exhibits normal muscle tone. Coordination normal.  Skin: Skin is warm and dry. No rash noted. No erythema. No pallor.  Scattered SKs and occ lentigines on trunk   Psychiatric: She has a normal mood and affect.          Assessment & Plan:   Problem List Items Addressed This Visit      Musculoskeletal and Integument    Osteopenia    In pt on PPI Rev last dexa 2016  No falls or fractures On vit D  Disc need for calcium/ vitamin D/ wt bearing exercise and bone density test every 2 y to monitor Disc safety/ fracture risk in detail          Other   Hyperlipidemia    Disc goals for lipids and reasons to control them Rev labs with pt Rev low sat fat diet in detail Enc good habits  LDL is down a bit and HDL is up -good      Overweight    Discussed how this problem influences overall health and the risks it imposes  Reviewed plan for weight loss with lower calorie diet (via better food choices and also portion control or program like weight watchers) and exercise building up to or more than 30 minutes 5 days per week including some aerobic activity         Routine general medical examination at a health care facility - Primary    Reviewed health habits including diet and exercise and skin cancer prevention Reviewed appropriate screening tests for age  Also reviewed health mt list, fam hx and immunization status , as well as social and family history   See HPI Labs reviewed  AMW will be scheduled  We will sign you up for the cologuard test  Flu shot today  Take care of yourself  Stay active physically and mentally  Make sure you are drinking enough water  Do not climb on ladders  Stop at check out to schedule your medicare interview       Screening for colon cancer    Signed up for cologuard program       Other Visit Diagnoses    Need for influenza vaccination       Relevant Orders   Flu Vaccine QUAD 36+ mos  IM (Completed)

## 2015-12-12 NOTE — Patient Instructions (Addendum)
We will sign you up for the cologuard test  Flu shot today  Take care of yourself  Stay active physically and mentally  Make sure you are drinking enough water  Do not climb on ladders  Stop at check out to schedule your medicare interview

## 2016-01-06 ENCOUNTER — Telehealth: Payer: Self-pay | Admitting: Family Medicine

## 2016-01-06 NOTE — Telephone Encounter (Signed)
LVM for pt to call and schedule AWV with Lesia for 2017.  May also schedule 2018 AWV + labs and CPE with Dr. Glori Bickers

## 2016-01-07 NOTE — Telephone Encounter (Signed)
Pt did not want to schedule AWV for 2017.  Scheduled 10/8 2018 for AWV + labs Scheduled 10/15 2018 or cpe with Dr. Glori Bickers

## 2016-01-26 LAB — COLOGUARD: Cologuard: NEGATIVE

## 2016-02-12 ENCOUNTER — Encounter: Payer: Self-pay | Admitting: *Deleted

## 2016-03-05 ENCOUNTER — Other Ambulatory Visit: Payer: Self-pay | Admitting: Family Medicine

## 2016-06-01 ENCOUNTER — Other Ambulatory Visit: Payer: Self-pay | Admitting: Family Medicine

## 2016-06-01 DIAGNOSIS — Z1231 Encounter for screening mammogram for malignant neoplasm of breast: Secondary | ICD-10-CM

## 2016-06-18 ENCOUNTER — Ambulatory Visit
Admission: RE | Admit: 2016-06-18 | Discharge: 2016-06-18 | Disposition: A | Discharge status: Home / Self Care | Payer: Medicare Other | Source: Ambulatory Visit | Attending: Family Medicine | Admitting: Family Medicine

## 2016-06-18 DIAGNOSIS — Z1231 Encounter for screening mammogram for malignant neoplasm of breast: Secondary | ICD-10-CM

## 2016-06-22 ENCOUNTER — Encounter: Payer: Self-pay | Admitting: *Deleted

## 2016-09-15 ENCOUNTER — Encounter: Payer: Self-pay | Admitting: Primary Care

## 2016-09-15 ENCOUNTER — Ambulatory Visit (INDEPENDENT_AMBULATORY_CARE_PROVIDER_SITE_OTHER): Payer: Medicare Other | Admitting: Primary Care

## 2016-09-15 VITALS — BP 118/70 | HR 63 | Temp 97.8°F | Ht 60.25 in | Wt 151.4 lb

## 2016-09-15 DIAGNOSIS — R21 Rash and other nonspecific skin eruption: Secondary | ICD-10-CM

## 2016-09-15 MED ORDER — TRIAMCINOLONE ACETONIDE 0.1 % EX CREA: 1.0000 "application " | TOPICAL_CREAM | Freq: Two times a day (BID) | CUTANEOUS | 0 refills | Status: DC

## 2016-09-15 NOTE — Progress Notes (Signed)
Subjective:    Patient ID: Erika Evans, female    DOB: Aug 29, 1934, 82 y.o.   MRN: [id number]  HPI  Erika Evans is an 82 year old female who presents today with multiple complaints.  1) Rash: Located to right upper anterior chest that she first noticed 1 week ago. Her rash is sometimes itchy in nature. She denies rashes elsewhere on her body and her current rash is overall about the same. She denies tick bites, pain, changes in meds/detergents/soaps. She's not used anything OTC for her symptoms.  2) Restless Legs: Present for years. Over the past several months she's noticed intermittent sharp pains to anterior thighs when sitting and at night. The pain will last a few seconds and is relieved by standing or moving. She's also taken Ibuprofen occasionally with relief. Overall she's not bothered by her symptoms. She denies weakness, back pain, numbness/tigling to her extremities.   Review of Systems  Constitutional: Negative for fatigue and fever.  Respiratory: Negative for shortness of breath.   Musculoskeletal: Positive for myalgias.  Skin: Positive for rash. Negative for wound.  Neurological: Negative for headaches.       Restless legs       Past Medical History:  Diagnosis Date  . Elevated liver enzymes   . Foot pain    Left; nerve problem; procedure  . GERD (gastroesophageal reflux disease)   . Hepatitis B carrier ([place])    Positive hep B test at [facility]  . Hyperlipidemia    Mild  . Osteoarthritis   . Osteopenia   . Pneumonia 2004     Social History   Social History  . Marital status: Widowed    Spouse name: N/A  . Number of children: 0  . Years of education: N/A   Occupational History  .  [employer]    Retired   Social History Main Topics  . Smoking status: Never Smoker  . Smokeless tobacco: Never Used  . Alcohol use No  . Drug use: No  . Sexual activity: Not on file   Other Topics Concern  . Not on file   Social History Narrative   Lost husband  November 2004.   Sometimes walks for exercise.   Planning trip to [place] 7/10.    Past Surgical History:  Procedure Laterality Date  . "Blocked kidney"    . BACK SURGERY    . CARDIOVASCULAR STRESS TEST  9/07   Neg  . COLONOSCOPY  9/06   Diverticulosis, hems  . Dexa     Osteopenis 9/01, improved 10/03, decreased BMD 5/06  . PARTIAL HYSTERECTOMY    . US ECHOCARDIOGRAPHY  7/03   Abd-normal  . US RENAL/AORTA  2007   Neg    Family History  Problem Relation Age of Onset  . Lung cancer Father   . Cancer Sister        Bladder  . Heart attack Brother   . Leukemia Brother   . Stroke Brother     Allergies  Allergen Reactions  . Alendronate Sodium     REACTION: acid reflux  . Miacalcin [Calcitonin ([place])] Other (See Comments)    Pt thinks the spray gave her a staph infection in her nose   . Raloxifene     REACTION: severe muscle pain and weakness    Current Outpatient Prescriptions on File Prior to Visit  Medication Sig Dispense Refill  . albuterol (PROVENTIL HFA;VENTOLIN HFA) 108 (90 BASE) MCG/ACT inhaler Inhale 2 puffs into the lungs  every 4 (four) hours as needed for wheezing or shortness of breath. 1 Inhaler 3  . aspirin 81 MG tablet Take 81 mg by mouth daily.      . Bepotastine Besilate (BEPREVE) 1.5 % SOLN Place 1 drop into both eyes as needed.    . Cholecalciferol (VITAMIN D-3) 1000 UNITS CAPS Take 2 capsules by mouth daily.    . fish oil-omega-3 fatty acids 1000 MG capsule Take 1 g by mouth 2 (two) times daily.     . Fluticasone-Salmeterol (ADVAIR DISKUS) 250-50 MCG/DOSE AEPB Inhale 1 puff into the lungs every 12 (twelve) hours.     Marland Kitchen levocetirizine (XYZAL) 5 MG tablet Take 5 mg by mouth at bedtime.    . Misc Natural Products (GLUCOSAMINE CHONDROITIN VIT D3 PO) Take 1 capsule by mouth daily.    . montelukast (SINGULAIR) 10 MG tablet Take 10 mg by mouth daily.      . Naproxen Sodium (ALEVE) 220 MG CAPS Take by mouth daily as needed.    Marland Kitchen omeprazole (PRILOSEC) 20 MG  capsule TAKE ONE CAPSULE BY MOUTH DAILY 90 capsule 2   No current facility-administered medications on file prior to visit.     There were no vitals taken for this visit.   Objective:   Physical Exam  Constitutional: She appears well-nourished.  Neck: Neck supple.  Cardiovascular: Normal rate and regular rhythm.   Pulmonary/Chest: Effort normal and breath sounds normal.  Musculoskeletal:  Strength to lower extremities equal.  Skin: Skin is warm and dry.  Mild rash to right anterior upper chest, non papular, no lesions. Appears to be sunburn or heat rash. No obvious contact dermatitis.  2 small insect appearing bites, well healing, no rashes around bites. Non tender. No erythema.          Assessment & Plan:  Rash:  Located to right anterior chest. No obvious contact dermatitis, shingles lesions, hives. Do not suspect allergic reaction. This does resemble sunburn/heat rash. Sunburn also noted to left posterior neck line. Will treat with low dose cortisone cream BID.  Discussed to use Sunscreen and bug spray when out doors. She will call Monday if no improvement.  Restless Legs:  Long standing history, now with intermittent sharp pains. Exam overall unremarkable. Good strength bilaterally, no back pain. Offered work up including lumbar plain films, physical therapy. She kindly declines as symptoms are tolerable and dissipate with movement and occasional ibuprofen use. She will discuss with PCP if symptoms progress.  Sheral Flow, NP

## 2016-09-15 NOTE — Patient Instructions (Signed)
Start Triamcinolone 1% cream. Apply twice daily to rash.   Use sunscreen of at least SPF 30 when working outside.  Use bug spray when working outdoors.  Please call me if your rash does not improve.  It was a pleasure meeting you!

## 2016-12-02 ENCOUNTER — Other Ambulatory Visit: Payer: Self-pay | Admitting: Family Medicine

## 2016-12-12 ENCOUNTER — Telehealth: Payer: Self-pay | Admitting: Family Medicine

## 2016-12-12 DIAGNOSIS — Z0000 Encounter for general adult medical examination without abnormal findings: Secondary | ICD-10-CM

## 2016-12-12 DIAGNOSIS — E7800 Pure hypercholesterolemia, unspecified: Secondary | ICD-10-CM

## 2016-12-12 NOTE — Telephone Encounter (Signed)
-----   Message from Eustace Pen, LPN sent at 16/07/5372 10:57 AM EDT ----- Regarding: Labs 10/8 Lab orders needed. Thank you.  Insurance: [facility]

## 2016-12-13 ENCOUNTER — Ambulatory Visit (INDEPENDENT_AMBULATORY_CARE_PROVIDER_SITE_OTHER): Payer: Medicare Other

## 2016-12-13 VITALS — BP 122/76 | HR 70 | Temp 97.5°F | Ht 60.0 in | Wt 148.5 lb

## 2016-12-13 DIAGNOSIS — E7800 Pure hypercholesterolemia, unspecified: Secondary | ICD-10-CM

## 2016-12-13 DIAGNOSIS — Z23 Encounter for immunization: Secondary | ICD-10-CM | POA: Diagnosis not present

## 2016-12-13 DIAGNOSIS — Z0000 Encounter for general adult medical examination without abnormal findings: Secondary | ICD-10-CM | POA: Diagnosis not present

## 2016-12-13 LAB — CBC WITH DIFFERENTIAL/PLATELET
BASOS ABS: 0 10*3/uL (ref 0.0–0.1)
Basophils Relative: 0.2 % (ref 0.0–3.0)
Eosinophils Absolute: 0.2 10*3/uL (ref 0.0–0.7)
Eosinophils Relative: 2.3 % (ref 0.0–5.0)
HCT: 39.3 % (ref 36.0–46.0)
Hemoglobin: 12.9 g/dL (ref 12.0–15.0)
LYMPHS ABS: 2.5 10*3/uL (ref 0.7–4.0)
Lymphocytes Relative: 37.3 % (ref 12.0–46.0)
MCHC: 32.8 g/dL (ref 30.0–36.0)
MCV: 93 fl (ref 78.0–100.0)
MONO ABS: 0.5 10*3/uL (ref 0.1–1.0)
Monocytes Relative: 7.9 % (ref 3.0–12.0)
NEUTROS PCT: 52.3 % (ref 43.0–77.0)
Neutro Abs: 3.5 10*3/uL (ref 1.4–7.7)
Platelets: 202 10*3/uL (ref 150.0–400.0)
RBC: 4.23 Mil/uL (ref 3.87–5.11)
RDW: 13.3 % (ref 11.5–15.5)
WBC: 6.7 10*3/uL (ref 4.0–10.5)

## 2016-12-13 LAB — COMPREHENSIVE METABOLIC PANEL
ALK PHOS: 76 U/L (ref 39–117)
ALT: 18 U/L (ref 0–35)
AST: 19 U/L (ref 0–37)
Albumin: 3.9 g/dL (ref 3.5–5.2)
BILIRUBIN TOTAL: 0.5 mg/dL (ref 0.2–1.2)
BUN: 15 mg/dL (ref 6–23)
CO2: 32 meq/L (ref 19–32)
Calcium: 9.8 mg/dL (ref 8.4–10.5)
Chloride: 106 mEq/L (ref 96–112)
Creatinine, Ser: 0.77 mg/dL (ref 0.40–1.20)
GFR: 76.59 mL/min (ref >60.00)
GLUCOSE: 96 mg/dL (ref 70–99)
POTASSIUM: 5.2 meq/L — AB (ref 3.5–5.1)
SODIUM: 144 meq/L (ref 135–145)
TOTAL PROTEIN: 7.2 g/dL (ref 6.0–8.3)

## 2016-12-13 LAB — LIPID PANEL
CHOL/HDL RATIO: 3
Cholesterol: 206 mg/dL — ABNORMAL HIGH (ref 0–200)
HDL: 69.9 mg/dL (ref >39.00)
LDL Cholesterol: 124 mg/dL — ABNORMAL HIGH (ref 0–99)
NONHDL: 135.62
Triglycerides: 60 mg/dL (ref 0.0–149.0)
VLDL: 12 mg/dL (ref 0.0–40.0)

## 2016-12-13 LAB — TSH: TSH: 1.32 u[IU]/mL (ref 0.35–4.50)

## 2016-12-13 NOTE — Patient Instructions (Signed)
Erika Evans , Thank you for taking time to come for your Medicare Wellness Visit. I appreciate your ongoing commitment to your health goals. Please review the following plan we discussed and let me know if I can assist you in the future.   These are the goals we discussed: Goals    . Increase physical activity          As weather permits, I will attempt to walk at least 45 min 2-3 days per week.        This is a list of the screening recommended for you and due dates:  Health Maintenance  Topic Date Due  . Mammogram  06/18/2017  . Tetanus Vaccine  04/20/2021  . Flu Shot  Completed  . DEXA scan (bone density measurement)  Completed  . Pneumonia vaccines  Completed   Preventive Care for Adults  A healthy lifestyle and preventive care can promote health and wellness. Preventive health guidelines for adults include the following key practices.  . A routine yearly physical is a good way to check with your health care provider about your health and preventive screening. It is a chance to share any concerns and updates on your health and to receive a thorough exam.  . Visit your dentist for a routine exam and preventive care every 6 months. Brush your teeth twice a day and floss once a day. Good oral hygiene prevents tooth decay and gum disease.  . The frequency of eye exams is based on your age, health, family medical history, use  of contact lenses, and other factors. Follow your health care provider's ecommendations for frequency of eye exams.  . Eat a healthy diet. Foods like vegetables, fruits, whole grains, low-fat dairy products, and lean protein foods contain the nutrients you need without too many calories. Decrease your intake of foods high in solid fats, added sugars, and salt. Eat the right amount of calories for you. Get information about a proper diet from your health care provider, if necessary.  . Regular physical exercise is one of the most important things you can do for  your health. Most adults should get at least 150 minutes of moderate-intensity exercise (any activity that increases your heart rate and causes you to sweat) each week. In addition, most adults need muscle-strengthening exercises on 2 or more days a week.  Silver Sneakers may be a benefit available to you. To determine eligibility, you may visit the website: www.silversneakers.com or contact program at 628-630-7858 Mon-Fri between 8AM-8PM.   . Maintain a healthy weight. The body mass index (BMI) is a screening tool to identify possible weight problems. It provides an estimate of body fat based on height and weight. Your health care provider can find your BMI and can help you achieve or maintain a healthy weight.   For adults 20 years and older: ? A BMI below 18.5 is considered underweight. ? A BMI of 18.5 to 24.9 is normal. ? A BMI of 25 to 29.9 is considered overweight. ? A BMI of 30 and above is considered obese.   . Maintain normal blood lipids and cholesterol levels by exercising and minimizing your intake of saturated fat. Eat a balanced diet with plenty of fruit and vegetables. Blood tests for lipids and cholesterol should begin at age 79 and be repeated every 5 years. If your lipid or cholesterol levels are high, you are over 50, or you are at high risk for heart disease, you may need your cholesterol levels checked  more frequently. Ongoing high lipid and cholesterol levels should be treated with medicines if diet and exercise are not working.  . If you smoke, find out from your health care provider how to quit. If you do not use tobacco, please do not start.  . If you choose to drink alcohol, please do not consume more than 2 drinks per day. One drink is considered to be 12 ounces (355 mL) of beer, 5 ounces (148 mL) of wine, or 1.5 ounces (44 mL) of liquor.  . If you are 64-66 years old, ask your health care provider if you should take aspirin to prevent strokes.  . Use sunscreen.  Apply sunscreen liberally and repeatedly throughout the day. You should seek shade when your shadow is shorter than you. Protect yourself by wearing long sleeves, pants, a wide-brimmed hat, and sunglasses year round, whenever you are outdoors.  . Once a month, do a whole body skin exam, using a mirror to look at the skin on your back. Tell your health care provider of new moles, moles that have irregular borders, moles that are larger than a pencil eraser, or moles that have changed in shape or color.

## 2016-12-13 NOTE — Progress Notes (Signed)
Subjective:   Erika Evans is a 82 y.o. female who presents for Medicare Annual (Subsequent) preventive examination.  Review of Systems:  N/A Cardiac Risk Factors include: advanced age (>26men, >26 women);dyslipidemia     Objective:     Vitals: BP 122/76 (BP Location: Right Arm, Patient Position: Sitting, Cuff Size: Normal)   Pulse 70   Temp (!) 97.5 F (36.4 C) (Oral)   Ht 5' (1.524 m) Comment: no shoes  Wt 148 lb 8 oz (67.4 kg)   SpO2 93%   BMI 29.00 kg/m   Body mass index is 29 kg/m.   Tobacco History  Smoking Status  . Never Smoker  Smokeless Tobacco  . Never Used     Counseling given: No   Past Medical History:  Diagnosis Date  . Elevated liver enzymes   . Foot pain    Left; nerve problem; procedure  . GERD (gastroesophageal reflux disease)   . Hepatitis B carrier ([place])    Positive hep B test at [facility]  . Hyperlipidemia    Mild  . Osteoarthritis   . Osteopenia   . Pneumonia 2004   Past Surgical History:  Procedure Laterality Date  . "Blocked kidney"    . BACK SURGERY    . CARDIOVASCULAR STRESS TEST  9/07   Neg  . COLONOSCOPY  9/06   Diverticulosis, hems  . Dexa     Osteopenis 9/01, improved 10/03, decreased BMD 5/06  . PARTIAL HYSTERECTOMY    . US ECHOCARDIOGRAPHY  7/03   Abd-normal  . US RENAL/AORTA  2007   Neg   Family History  Problem Relation Age of Onset  . Lung cancer Father   . Cancer Sister        Bladder  . Heart attack Brother   . Leukemia Brother   . Stroke Brother    History  Sexual Activity  . Sexual activity: Not on file    Outpatient Encounter Prescriptions as of 12/13/2016  Medication Sig  . albuterol (PROVENTIL HFA;VENTOLIN HFA) 108 (90 BASE) MCG/ACT inhaler Inhale 2 puffs into the lungs every 4 (four) hours as needed for wheezing or shortness of breath.  Marland Kitchen aspirin 81 MG tablet Take 81 mg by mouth daily.    . Bepotastine Besilate (BEPREVE) 1.5 % SOLN Place 1 drop into both eyes as needed.  .  Cholecalciferol (VITAMIN D-3) 1000 UNITS CAPS Take 2 capsules by mouth daily.  . fish oil-omega-3 fatty acids 1000 MG capsule Take 1 g by mouth 2 (two) times daily.   . Fluticasone-Salmeterol (ADVAIR DISKUS) 250-50 MCG/DOSE AEPB Inhale 1 puff into the lungs every 12 (twelve) hours.   Marland Kitchen levocetirizine (XYZAL) 5 MG tablet Take 5 mg by mouth at bedtime.  . Misc Natural Products (GLUCOSAMINE CHONDROITIN VIT D3 PO) Take 1 capsule by mouth daily.  . montelukast (SINGULAIR) 10 MG tablet Take 10 mg by mouth daily.    . Naproxen Sodium (ALEVE) 220 MG CAPS Take by mouth daily as needed.  Marland Kitchen omeprazole (PRILOSEC) 20 MG capsule TAKE ONE CAPSULE BY MOUTH DAILY  . triamcinolone cream (KENALOG) 0.1 % Apply 1 application topically 2 (two) times daily. (Patient taking differently: Apply 1 application topically as needed. )   No facility-administered encounter medications on file as of 12/13/2016.     Activities of Daily Living In your present state of health, do you have any difficulty performing the following activities: 12/13/2016  Hearing? Y  Vision? N  Difficulty concentrating or making decisions? N  Walking or climbing stairs? Y  Comment right heel pain when walking  Dressing or bathing? N  Doing errands, shopping? N  Preparing Food and eating ? N  Using the Toilet? N  In the past six months, have you accidently leaked urine? Y  Do you have problems with loss of bowel control? N  Managing your Medications? N  Managing your Finances? N  Housekeeping or managing your Housekeeping? N  Some recent data might be hidden    Patient Care Team: Tower, Wynelle Fanny, MD as PCP - General    Assessment:     Hearing Screening   125Hz  250Hz  500Hz  1000Hz  2000Hz  3000Hz  4000Hz  6000Hz  8000Hz   Right ear:   40 40 40  0    Left ear:   40 40 40  0    Vision Screening Comments: Last vision exam in Nov 2017 with Dr. Maryruth Hancock B.    Exercise Activities and Dietary recommendations Current Exercise Habits: The patient does  not participate in regular exercise at present, Exercise limited by: orthopedic condition(s)  Goals    . Increase physical activity          As weather permits, I will attempt to walk at least 45 min 2-3 days per week.       Fall Risk Fall Risk  12/13/2016 01/28/2015 12/04/2012  Falls in the past year? No No No   Depression Screen PHQ 2/9 Scores 12/13/2016 01/28/2015 12/04/2012  PHQ - 2 Score 0 0 0  PHQ- 9 Score 0 - -     Cognitive Function MMSE - Mini Mental State Exam 12/13/2016  Orientation to time 5  Orientation to Place 5  Registration 3  Attention/ Calculation 0  Recall 3  Language- name 2 objects 0  Language- repeat 1  Language- follow 3 step command 3  Language- read & follow direction 0  Write a sentence 0  Copy design 0  Total score 20     PLEASE NOTE: A Mini-Cog screen was completed. Maximum score is 20. A value of 0 denotes this part of Folstein MMSE was not completed or the patient failed this part of the Mini-Cog screening.   Mini-Cog Screening Orientation to Time - Max 5 pts Orientation to Place - Max 5 pts Registration - Max 3 pts Recall - Max 3 pts Language Repeat - Max 1 pts Language Follow 3 Step Command - Max 3 pts     Immunization History  Administered Date(s) Administered  . Influenza Split 12/10/2010, 12/03/2011  . Influenza, High Dose Seasonal PF 12/13/2016  . Influenza,inj,Quad PF,6+ Mos 11/27/2012, 12/11/2013, 01/01/2015, 12/12/2015  . Pneumococcal Conjugate-13 01/28/2015  . Pneumococcal Polysaccharide-23 04/21/2011  . Td 04/21/2011  . Zoster 12/13/2013   Screening Tests Health Maintenance  Topic Date Due  . MAMMOGRAM  06/18/2017  . TETANUS/TDAP  04/20/2021  . INFLUENZA VACCINE  Completed  . DEXA SCAN  Completed  . PNA vac Low Risk Adult  Completed      Plan:     I have personally reviewed and addressed the Medicare Annual Wellness questionnaire and have noted the following in the patient's chart:  A. Medical and social  history B. Use of alcohol, tobacco or illicit drugs  C. Current medications and supplements D. Functional ability and status E.  Nutritional status F.  Physical activity G. Advance directives H. List of other physicians I.  Hospitalizations, surgeries, and ER visits in previous 12 months J.  Vitals K. Screenings to include hearing, vision, cognitive, depression  L. Referrals and appointments - none  In addition, I have reviewed and discussed with patient certain preventive protocols, quality metrics, and best practice recommendations. A written personalized care plan for preventive services as well as general preventive health recommendations were provided to patient.  See attached scanned questionnaire for additional information.   Signed,   Lindell Noe, MHA, BS, LPN Health Coach

## 2016-12-13 NOTE — Progress Notes (Signed)
Pre visit review using our clinic review tool, if applicable. No additional management support is needed unless otherwise documented below in the visit note. 

## 2016-12-14 NOTE — Progress Notes (Signed)
PCP notes:   Health maintenance:  Flu vaccine - administered  Abnormal screenings:   Hearing - failed  Hearing Screening   125Hz  250Hz  500Hz  1000Hz  2000Hz  3000Hz  4000Hz  6000Hz  8000Hz   Right ear:   40 40 40  0    Left ear:   40 40 40  0      Patient concerns:   None  Nurse concerns:  None  Next PCP appt:   12/20/16 @ 1130  I reviewed health advisor's note, was available for consultation, and agree with documentation and plan. Loura Pardon MD

## 2016-12-20 ENCOUNTER — Ambulatory Visit (INDEPENDENT_AMBULATORY_CARE_PROVIDER_SITE_OTHER): Payer: Medicare Other | Admitting: Family Medicine

## 2016-12-20 ENCOUNTER — Encounter: Payer: Self-pay | Admitting: Family Medicine

## 2016-12-20 VITALS — BP 120/70 | HR 76 | Temp 98.3°F | Wt 148.5 lb

## 2016-12-20 DIAGNOSIS — M8580 Other specified disorders of bone density and structure, unspecified site: Secondary | ICD-10-CM | POA: Diagnosis not present

## 2016-12-20 DIAGNOSIS — E785 Hyperlipidemia, unspecified: Secondary | ICD-10-CM

## 2016-12-20 DIAGNOSIS — E2839 Other primary ovarian failure: Secondary | ICD-10-CM | POA: Diagnosis not present

## 2016-12-20 DIAGNOSIS — L989 Disorder of the skin and subcutaneous tissue, unspecified: Secondary | ICD-10-CM | POA: Diagnosis not present

## 2016-12-20 DIAGNOSIS — E663 Overweight: Secondary | ICD-10-CM

## 2016-12-20 DIAGNOSIS — Z0000 Encounter for general adult medical examination without abnormal findings: Secondary | ICD-10-CM

## 2016-12-20 DIAGNOSIS — Z1211 Encounter for screening for malignant neoplasm of colon: Secondary | ICD-10-CM | POA: Diagnosis not present

## 2016-12-20 DIAGNOSIS — E7800 Pure hypercholesterolemia, unspecified: Secondary | ICD-10-CM

## 2016-12-20 NOTE — Assessment & Plan Note (Signed)
Ref for dexa 

## 2016-12-20 NOTE — Assessment & Plan Note (Signed)
Reviewed health habits including diet and exercise and skin cancer prevention Reviewed appropriate screening tests for age  Also reviewed health mt list, fam hx and immunization status , as well as social and family history    See HPI Reviewed AMW  Rev labs  Ref for dexa  Also dermatology for skin lesion

## 2016-12-20 NOTE — Assessment & Plan Note (Signed)
Rev dexa 11/16 mild osteopenia  Ordered 2 y f/u  No falls or fx On D  Intol of calcium Stressed imp ov exercise

## 2016-12-20 NOTE — Patient Instructions (Addendum)
You may have plantar fasciitis (causing foot pain)  Here is a handout  We will refer you to dermatology for spot on your ankle   Take care of yourself   Keep working on weight loss  Try to get most of your carbohydrates from produce (with the exception of white potatoes)  Eat less bread/pasta/rice/snack foods/cereals/sweets and other items from the middle of the grocery store (processed carbs)

## 2016-12-20 NOTE — Assessment & Plan Note (Signed)
Discussed how this problem influences overall health and the risks it imposes  Reviewed plan for weight loss with lower calorie diet (via better food choices and also portion control or program like weight watchers) and exercise building up to or more than 30 minutes 5 days per week including some aerobic activity    

## 2016-12-20 NOTE — Progress Notes (Signed)
Subjective:    Patient ID: Erika Evans, female    DOB: 06/24/1934, 82 y.o.   MRN: [id number]  HPI  Here for health maintenance exam and to review chronic medical problems    Doing well overall  Some aches and pains  A lot of arthritis in general    Had amw 10/8 Hearing screen- missed 4000 Hz tone bilat  Thinks she hears fairly well - does not want aide  She has a hard time with fast talkers  Had her flu vaccine (had a reaction to the high dose) - chills with temp over 101 and nausea   She has lifelong neck problems  At times hurts to lift arms over shoulder level   Has skin lesions on L lower leg and back  One on the leg stings  Itchy spot on back  Was told she had "plaque" in her artery ([facility]) ?    Wt Readings from Last 3 Encounters:  12/20/16 148 lb 8 oz (67.4 kg)  12/13/16 148 lb 8 oz (67.4 kg)  09/15/16 151 lb 6.4 oz (68.7 kg)  wt is stable  Has committed to walk 2-3 d per week  29.00 kg/m   Mammogram 4/18 normal Self breast exam -no lumps or changes  She has always had breast soreness with more caffeine   She has had a partial hysterectomy  No gyn symptoms   dexa 11/16- osteopenia  No falls or fracture  Wants to get dexa scheduled  Does not tolerate calcium  Takes D regularly    Colon cancer screening  Colonoscopy 9/06 ifob neg 12/16 cologuard neg 11/17  Hyperlipidemia Lab Results  Component Value Date   CHOL 206 (H) 12/13/2016   CHOL 201 (H) 12/08/2015   CHOL 209 (H) 01/28/2015   Lab Results  Component Value Date   HDL 69.90 12/13/2016   HDL 71.40 12/08/2015   HDL 62.40 01/28/2015   Lab Results  Component Value Date   LDLCALC 124 (H) 12/13/2016   LDLCALC 118 (H) 12/08/2015   LDLCALC 127 (H) 01/28/2015   Lab Results  Component Value Date   TRIG 60.0 12/13/2016   TRIG 56.0 12/08/2015   TRIG 96.0 01/28/2015   Lab Results  Component Value Date   CHOLHDL 3 12/13/2016   CHOLHDL 3 12/08/2015   CHOLHDL 3 01/28/2015    Lab Results  Component Value Date   LDLDIRECT 123.5 11/27/2012   LDLDIRECT 146.1 11/25/2011   LDLDIRECT 144.2 04/21/2011   Overall pretty good  LDL up very slightly  Seldom eats greasy foods / avoids red meat for the most part (used to eat it a lot)   Results for orders placed or performed in visit on 12/13/16  CBC with Differential/Platelet  Result Value Ref Range   WBC 6.7 4.0 - 10.5 K/uL   RBC 4.23 3.87 - 5.11 Mil/uL   Hemoglobin 12.9 12.0 - 15.0 g/dL   HCT 39.3 36.0 - 46.0 %   MCV 93.0 78.0 - 100.0 fl   MCHC 32.8 30.0 - 36.0 g/dL   RDW 13.3 11.5 - 15.5 %   Platelets 202.0 150.0 - 400.0 K/uL   Neutrophils Relative % 52.3 43.0 - 77.0 %   Lymphocytes Relative 37.3 12.0 - 46.0 %   Monocytes Relative 7.9 3.0 - 12.0 %   Eosinophils Relative 2.3 0.0 - 5.0 %   Basophils Relative 0.2 0.0 - 3.0 %   Neutro Abs 3.5 1.4 - 7.7 K/uL   Lymphs Abs 2.5  0.7 - 4.0 K/uL   Monocytes Absolute 0.5 0.1 - 1.0 K/uL   Eosinophils Absolute 0.2 0.0 - 0.7 K/uL   Basophils Absolute 0.0 0.0 - 0.1 K/uL  Comprehensive metabolic panel  Result Value Ref Range   Sodium 144 135 - 145 mEq/L   Potassium 5.2 (H) 3.5 - 5.1 mEq/L   Chloride 106 96 - 112 mEq/L   CO2 32 19 - 32 mEq/L   Glucose, Bld 96 70 - 99 mg/dL   BUN 15 6 - 23 mg/dL   Creatinine, Ser 0.77 0.40 - 1.20 mg/dL   Total Bilirubin 0.5 0.2 - 1.2 mg/dL   Alkaline Phosphatase 76 39 - 117 U/L   AST 19 0 - 37 U/L   ALT 18 0 - 35 U/L   Total Protein 7.2 6.0 - 8.3 g/dL   Albumin 3.9 3.5 - 5.2 g/dL   Calcium 9.8 8.4 - 10.5 mg/dL   GFR 76.59 >60.00 mL/min  Lipid panel  Result Value Ref Range   Cholesterol 206 (H) 0 - 200 mg/dL   Triglycerides 60.0 0.0 - 149.0 mg/dL   HDL 69.90 >39.00 mg/dL   VLDL 12.0 0.0 - 40.0 mg/dL   LDL Cholesterol 124 (H) 0 - 99 mg/dL   Total CHOL/HDL Ratio 3    NonHDL 135.62   TSH  Result Value Ref Range   TSH 1.32 0.35 - 4.50 uIU/mL    K was slightly high  Nl renal fxn  Cut back on bananas  Loves fruit and green  veggies   Review of Systems  Constitutional: Negative for activity change, appetite change, fatigue, fever and unexpected weight change.  HENT: Negative for congestion, ear pain, rhinorrhea, sinus pressure and sore throat.   Eyes: Negative for pain, redness and visual disturbance.  Respiratory: Negative for cough, shortness of breath and wheezing.   Cardiovascular: Negative for chest pain and palpitations.  Gastrointestinal: Negative for abdominal pain, blood in stool, constipation and diarrhea.  Endocrine: Negative for polydipsia and polyuria.  Genitourinary: Negative for dysuria, frequency and urgency.  Musculoskeletal: Positive for neck pain. Negative for arthralgias, back pain and myalgias.       Pos for R heel pain   Skin: Negative for pallor and rash.       Pos for itchy areas on back  Stinging area L leg   Allergic/Immunologic: Negative for environmental allergies.  Neurological: Positive for headaches. Negative for dizziness and syncope.       Occ R sided head pain/ sharp    On and off   Hematological: Negative for adenopathy. Does not bruise/bleed easily.  Psychiatric/Behavioral: Negative for decreased concentration and dysphoric mood. The patient is not nervous/anxious.        Objective:   Physical Exam  Constitutional: She appears well-developed and well-nourished. No distress.  overwt and well app  HENT:  Head: Normocephalic and atraumatic.  Right Ear: External ear normal.  Left Ear: External ear normal.  Mouth/Throat: Oropharynx is clear and moist.  Eyes: Pupils are equal, round, and reactive to light. Conjunctivae and EOM are normal. No scleral icterus.  Neck: Normal range of motion. Neck supple. No JVD present. Carotid bruit is not present. No thyromegaly present.  Cardiovascular: Normal rate, regular rhythm, normal heart sounds and intact distal pulses.  Exam reveals no gallop.   Pulmonary/Chest: Effort normal and breath sounds normal. No respiratory distress. She  has no wheezes. She exhibits no tenderness.  Abdominal: Soft. Bowel sounds are normal. She exhibits no distension, no abdominal  bruit and no mass. There is no tenderness.  Genitourinary:  Genitourinary Comments: Pt gets breast and pelvic exam from her gyn   Musculoskeletal: Normal range of motion. She exhibits no edema or tenderness.  No kyphosis or acute joint changes   Lymphadenopathy:    She has no cervical adenopathy.  Neurological: She is alert. She has normal reflexes. No cranial nerve deficit. She exhibits normal muscle tone. Coordination normal.  R heel is tender  No swelling or skin change   Skin: Skin is warm and dry. No rash noted. No erythema. No pallor.  Solar lentigines diffusely Many SKs   L ankle - oval area of erythema and scale that resembles irritated sk or dermatitis or other   Psychiatric: She has a normal mood and affect.          Assessment & Plan:   Problem List Items Addressed This Visit      Musculoskeletal and Integument   Osteopenia    Rev dexa 11/16 mild osteopenia  Ordered 2 y f/u  No falls or fx On D  Intol of calcium Stressed imp ov exercise       Skin lesion    Erythematous scaly area -oval and itchy  ? Irritated sk or other  Ref to derm for eval and tx      Relevant Orders   Ambulatory referral to Dermatology     Other   Estrogen deficiency    Ref for dexa       Relevant Orders   DG Bone Density   Hyperlipidemia    Disc goals for lipids and reasons to control them Rev labs with pt Rev low sat fat diet in detail  Fair profile with high HDL       Overweight    Discussed how this problem influences overall health and the risks it imposes  Reviewed plan for weight loss with lower calorie diet (via better food choices and also portion control or program like weight watchers) and exercise building up to or more than 30 minutes 5 days per week including some aerobic activity         Routine general medical examination at a  health care facility - Primary    Reviewed health habits including diet and exercise and skin cancer prevention Reviewed appropriate screening tests for age  Also reviewed health mt list, fam hx and immunization status , as well as social and family history    See HPI Reviewed AMW  Rev labs  Ref for dexa  Also dermatology for skin lesion         Screening for colon cancer    cologuard neg 11/17

## 2016-12-20 NOTE — Assessment & Plan Note (Signed)
Disc goals for lipids and reasons to control them Rev labs with pt Rev low sat fat diet in detail  Fair profile with high HDL

## 2016-12-20 NOTE — Assessment & Plan Note (Signed)
Erythematous scaly area -oval and itchy  ? Irritated sk or other  Ref to derm for eval and tx

## 2016-12-20 NOTE — Assessment & Plan Note (Signed)
cologuard neg 11/17

## 2017-01-13 ENCOUNTER — Encounter: Payer: Self-pay | Admitting: Podiatry

## 2017-01-13 ENCOUNTER — Ambulatory Visit: Payer: Medicare Other | Admitting: Podiatry

## 2017-01-13 ENCOUNTER — Ambulatory Visit (INDEPENDENT_AMBULATORY_CARE_PROVIDER_SITE_OTHER): Payer: Medicare Other

## 2017-01-13 VITALS — BP 134/72 | HR 75

## 2017-01-13 DIAGNOSIS — M722 Plantar fascial fibromatosis: Secondary | ICD-10-CM

## 2017-01-13 MED ORDER — MELOXICAM 15 MG PO TABS: 15.0000 mg | ORAL_TABLET | Freq: Every day | ORAL | 0 refills | Status: DC

## 2017-01-13 NOTE — Progress Notes (Signed)
   Subjective:    Patient ID: Erika Evans, female    DOB: May 14, 1934, 82 y.o.   MRN: [id number]  HPIthis patient presents the office with chief complaint of 2 painful heels.  She says that approximately 3 weeks ago she was working in her yard and felt she must have injured her  at that time.  She says she was taking and using her foot too presents the shovel into the ground.  She says that it has been 3 weeks since she injured her feet and she now is having significant pain upon rising in the morning, especially in her right foot.  She says that her pain is 8 or 9 out of 10.  She has provided no self treatment aside from picking up a new pair of orthotics to be worn in her shoes.  She presents the office today for an evaluation and treatment of her painful heels both feet.      Review of Systems  All other systems reviewed and are negative.      Objective:   Physical Exam General Appearance  Alert, conversant and in no acute stress.  Vascular  Dorsalis pedis and posterior pulses are palpable  bilaterally.  Capillary return is within normal limits  Bilaterally. Temperature is within normal limits  Bilaterally  Neurologic  Senn-Weinstein monofilament wire test within normal limits  bilaterally. Muscle power  Within normal limits bilaterally.  Nails Thick disfigured discolored nails with subungual debride bilaterally from hallux to fifth toes bilaterally. No evidence of bacterial infection or drainage bilaterally.  Orthopedic  No limitations of motion of motion feet bilaterally.  No crepitus or effusions noted.  No bony pathology or digital deformities noted. Pain noted in right heel greater than left heel.  Swelling right heel.    Skin  normotropic skin with no porokeratosis noted bilaterally.  No signs of infections or ulcers noted.          Assessment & Plan:  Plantar fascitis heels  B/l  IE  X-rays reveal calcification at the insertion of the plantar fascia both feet.  Patient  also has marked HAV deformities both feet.  Examination revealed plantar fasciitis the right greater than the left.  She was told to continue to wear her orthotics.  Prescribed Mobic to be taken by mouth.  Injection therapy right heel. Injection therapy using 1.0 cc. Of 2% xylocaine( 20 mg.) plus 1 cc. of kenalog-la ( 10 mg) plus 1/2 cc. of dexamethazone phosphate ( 2 mg) .  Return to the clinic in 10-14 days for further evaluation and treatment   Gardiner Barefoot DPM

## 2017-01-24 ENCOUNTER — Ambulatory Visit (INDEPENDENT_AMBULATORY_CARE_PROVIDER_SITE_OTHER): Payer: Medicare Other | Admitting: Podiatry

## 2017-01-24 ENCOUNTER — Encounter: Payer: Self-pay | Admitting: Podiatry

## 2017-01-24 DIAGNOSIS — M722 Plantar fascial fibromatosis: Secondary | ICD-10-CM

## 2017-01-24 DIAGNOSIS — H25013 Cortical age-related cataract, bilateral: Secondary | ICD-10-CM | POA: Diagnosis not present

## 2017-01-24 DIAGNOSIS — H04123 Dry eye syndrome of bilateral lacrimal glands: Secondary | ICD-10-CM | POA: Diagnosis not present

## 2017-01-24 DIAGNOSIS — H524 Presbyopia: Secondary | ICD-10-CM | POA: Diagnosis not present

## 2017-01-24 DIAGNOSIS — H2513 Age-related nuclear cataract, bilateral: Secondary | ICD-10-CM | POA: Diagnosis not present

## 2017-01-24 NOTE — Progress Notes (Signed)
This patient returns to the office stating that her heels are doing much better.  She says her heels are 60 % improved.  She has been treated with injection therapy and Mobic.  She is pleased that she is doing better.  She returns to the office today for continued evaluation and treatment of her feet. Patient says she can get up and walk without having any pain or discomfort.  General Appearance  Alert, conversant and in no acute stress.  Vascular  Dorsalis pedis and posterior pulses are palpable  bilaterally.  Capillary return is within normal limits  Bilaterally. Temperature is within normal limits  Bilaterally  Neurologic  Senn-Weinstein monofilament wire test within normal limits  bilaterally. Muscle power  Within normal limits bilaterally.  Nails thick disfigured discolored hallux toenails both feet.  Orthopedic  No limitations of motion of motion feet bilaterally.  No crepitus or effusions noted.  No bony pathology or digital deformities noted. Severe HAV deformity first MPJ, left greater than the right.  No palpable pain noted at the insertion of the plantar fascia both feet.  Skin  normotropic skin with no porokeratosis noted bilaterally.  No signs of infections or ulcers noted.    Plantar fascitis  B/L  ROV  Discussed her painful feet and she feels that she is doing well and desires to proceed as she is doing.  She is to return to the office when necessary   Gardiner Barefoot DPM

## 2017-02-09 ENCOUNTER — Other Ambulatory Visit: Payer: Self-pay | Admitting: Podiatry

## 2017-02-10 ENCOUNTER — Ambulatory Visit (INDEPENDENT_AMBULATORY_CARE_PROVIDER_SITE_OTHER)
Admission: RE | Admit: 2017-02-10 | Discharge: 2017-02-10 | Disposition: A | Discharge status: Home / Self Care | Payer: Medicare Other | Source: Ambulatory Visit | Attending: Family Medicine | Admitting: Family Medicine

## 2017-02-10 DIAGNOSIS — E2839 Other primary ovarian failure: Secondary | ICD-10-CM

## 2017-03-08 DIAGNOSIS — H269 Unspecified cataract: Secondary | ICD-10-CM

## 2017-03-08 HISTORY — DX: Unspecified cataract: H26.9

## 2017-05-06 HISTORY — PX: CATARACT EXTRACTION W/ INTRAOCULAR LENS IMPLANT: SHX1309

## 2017-05-20 HISTORY — PX: CATARACT EXTRACTION W/ INTRAOCULAR LENS IMPLANT: SHX1309

## 2017-06-02 ENCOUNTER — Other Ambulatory Visit: Payer: Self-pay

## 2017-06-02 ENCOUNTER — Telehealth: Payer: Self-pay | Admitting: Family Medicine

## 2017-06-02 MED ORDER — OMEPRAZOLE 20 MG PO CPDR: 20.0000 mg | DELAYED_RELEASE_CAPSULE | Freq: Every day | ORAL | 1 refills | Status: DC

## 2017-06-02 NOTE — Telephone Encounter (Signed)
Copied from [place] [phone]. Topic: Quick Communication - Rx Refill/Question >> Jun 02, 2017 12:08 PM Carolyn Stare wrote: Medication omeprazole (PRILOSEC) 20 MG capsule  Has the patient contacted their pharmacy yes    Preferred Pharmacy  [facility]   Agent: Please be advised that RX refills may take up to 3 business days. We ask that you follow-up with your pharmacy.

## 2017-08-24 ENCOUNTER — Other Ambulatory Visit: Payer: Self-pay | Admitting: Family Medicine

## 2017-08-24 DIAGNOSIS — Z1231 Encounter for screening mammogram for malignant neoplasm of breast: Secondary | ICD-10-CM

## 2017-09-14 ENCOUNTER — Ambulatory Visit
Admission: RE | Admit: 2017-09-14 | Discharge: 2017-09-14 | Disposition: A | Discharge status: Home / Self Care | Payer: Medicare Other | Source: Ambulatory Visit | Attending: Family Medicine | Admitting: Family Medicine

## 2017-09-14 DIAGNOSIS — Z1231 Encounter for screening mammogram for malignant neoplasm of breast: Secondary | ICD-10-CM

## 2017-11-22 ENCOUNTER — Other Ambulatory Visit: Payer: Self-pay | Admitting: *Deleted

## 2017-11-22 MED ORDER — OMEPRAZOLE 20 MG PO CPDR: 20.0000 mg | DELAYED_RELEASE_CAPSULE | Freq: Every day | ORAL | 1 refills | Status: DC

## 2017-12-11 ENCOUNTER — Telehealth: Payer: Self-pay | Admitting: Family Medicine

## 2017-12-11 DIAGNOSIS — E7800 Pure hypercholesterolemia, unspecified: Secondary | ICD-10-CM

## 2017-12-11 DIAGNOSIS — Z0000 Encounter for general adult medical examination without abnormal findings: Secondary | ICD-10-CM

## 2017-12-11 NOTE — Telephone Encounter (Signed)
-----   Message from Eustace Pen, LPN sent at 10/12/5782  3:29 PM EDT ----- Regarding: Labs 10/9 Lab orders needed. Thank you.  Insurance:  [facility] [employer]

## 2017-12-14 ENCOUNTER — Ambulatory Visit (INDEPENDENT_AMBULATORY_CARE_PROVIDER_SITE_OTHER): Payer: Medicare Other

## 2017-12-14 VITALS — BP 110/70 | HR 70 | Temp 97.7°F | Ht 60.0 in | Wt 144.8 lb

## 2017-12-14 DIAGNOSIS — Z0000 Encounter for general adult medical examination without abnormal findings: Secondary | ICD-10-CM

## 2017-12-14 DIAGNOSIS — Z23 Encounter for immunization: Secondary | ICD-10-CM | POA: Diagnosis not present

## 2017-12-14 DIAGNOSIS — E7800 Pure hypercholesterolemia, unspecified: Secondary | ICD-10-CM

## 2017-12-14 LAB — TSH: TSH: 2.13 u[IU]/mL (ref 0.35–4.50)

## 2017-12-14 LAB — CBC WITH DIFFERENTIAL/PLATELET
BASOS ABS: 0 10*3/uL (ref 0.0–0.1)
Basophils Relative: 0.3 % (ref 0.0–3.0)
Eosinophils Absolute: 0.2 10*3/uL (ref 0.0–0.7)
Eosinophils Relative: 2.5 % (ref 0.0–5.0)
HCT: 39.4 % (ref 36.0–46.0)
Hemoglobin: 13 g/dL (ref 12.0–15.0)
LYMPHS ABS: 2.8 10*3/uL (ref 0.7–4.0)
Lymphocytes Relative: 36.2 % (ref 12.0–46.0)
MCHC: 33.1 g/dL (ref 30.0–36.0)
MCV: 91.6 fl (ref 78.0–100.0)
MONOS PCT: 10.8 % (ref 3.0–12.0)
Monocytes Absolute: 0.8 10*3/uL (ref 0.1–1.0)
NEUTROS ABS: 3.8 10*3/uL (ref 1.4–7.7)
NEUTROS PCT: 50.2 % (ref 43.0–77.0)
PLATELETS: 212 10*3/uL (ref 150.0–400.0)
RBC: 4.3 Mil/uL (ref 3.87–5.11)
RDW: 13.5 % (ref 11.5–15.5)
WBC: 7.6 10*3/uL (ref 4.0–10.5)

## 2017-12-14 LAB — COMPREHENSIVE METABOLIC PANEL
ALT: 12 U/L (ref 0–35)
AST: 15 U/L (ref 0–37)
Albumin: 4.2 g/dL (ref 3.5–5.2)
Alkaline Phosphatase: 77 U/L (ref 39–117)
BILIRUBIN TOTAL: 0.8 mg/dL (ref 0.2–1.2)
BUN: 19 mg/dL (ref 6–23)
CO2: 35 meq/L — AB (ref 19–32)
CREATININE: 0.87 mg/dL (ref 0.40–1.20)
Calcium: 10.1 mg/dL (ref 8.4–10.5)
Chloride: 101 mEq/L (ref 96–112)
GFR: 66.35 mL/min (ref >60.00)
GLUCOSE: 90 mg/dL (ref 70–99)
Potassium: 4 mEq/L (ref 3.5–5.1)
SODIUM: 142 meq/L (ref 135–145)
Total Protein: 7.7 g/dL (ref 6.0–8.3)

## 2017-12-14 LAB — LIPID PANEL
CHOL/HDL RATIO: 3
Cholesterol: 214 mg/dL — ABNORMAL HIGH (ref 0–200)
HDL: 67.9 mg/dL (ref >39.00)
LDL Cholesterol: 132 mg/dL — ABNORMAL HIGH (ref 0–99)
NonHDL: 146.06
Triglycerides: 72 mg/dL (ref 0.0–149.0)
VLDL: 14.4 mg/dL (ref 0.0–40.0)

## 2017-12-14 NOTE — Progress Notes (Signed)
Subjective:   Erika Evans is a 83 y.o. female who presents for Medicare Annual (Subsequent) preventive examination.  Review of Systems:  N/A Cardiac Risk Factors include: advanced age (>75men, >53 women);dyslipidemia     Objective:     Vitals: BP 110/70 (BP Location: Right Arm, Patient Position: Sitting, Cuff Size: Normal)   Pulse 70   Temp 97.7 F (36.5 C) (Oral)   Ht 5' (1.524 m) Comment: no shoes  Wt 144 lb 12 oz (65.7 kg)   SpO2 96%   BMI 28.27 kg/m   Body mass index is 28.27 kg/m.  Advanced Directives 12/14/2017 12/13/2016  Does Patient Have a Medical Advance Directive? No Yes  Type of Advance Directive - [place];Living will  Copy of [place] in Chart? - No - copy requested  Would patient like information on creating a medical advance directive? No - Patient declined -    Tobacco Social History   Tobacco Use  Smoking Status Never Smoker  Smokeless Tobacco Never Used     Counseling given: No   Clinical Intake:  Pre-visit preparation completed: Yes  Pain : No/denies pain Pain Score: 0-No pain(6-8/10 when walking and sitting in certain chairs) Pain Type: Chronic pain Pain Location: Foot Pain Orientation: Right     Nutritional Status: BMI 25 -29 Overweight Nutritional Risks: None Diabetes: No  How often do you need to have someone help you when you read instructions, pamphlets, or other written materials from your doctor or pharmacy?: 1 - Never What is the last grade level you completed in school?: 12th grade + 1 yr college  Interpreter Needed?: No  Comments: pt is a widow and lives alone Information entered by :: LPinson, LPN  Past Medical History:  Diagnosis Date  . Cataract 2019   resolved with surgery  . Elevated liver enzymes   . Foot pain    Left; nerve problem; procedure  . GERD (gastroesophageal reflux disease)   . Hepatitis B carrier ([place])    Positive hep B test at [facility]  .  Hyperlipidemia    Mild  . Osteoarthritis   . Osteopenia   . Pneumonia 2004   Past Surgical History:  Procedure Laterality Date  . "Blocked kidney"    . BACK SURGERY    . CARDIOVASCULAR STRESS TEST  9/07   Neg  . CATARACT EXTRACTION W/ INTRAOCULAR LENS IMPLANT Right 05/06/2017   Dr. Tommy Rainwater  . CATARACT EXTRACTION W/ INTRAOCULAR LENS IMPLANT Left 05/20/2017   Dr. Tommy Rainwater  . COLONOSCOPY  9/06   Diverticulosis, hems  . Dexa     Osteopenis 9/01, improved 10/03, decreased BMD 5/06  . PARTIAL HYSTERECTOMY    . US ECHOCARDIOGRAPHY  7/03   Abd-normal  . US RENAL/AORTA  2007   Neg   Family History  Problem Relation Age of Onset  . Lung cancer Father   . Cancer Sister        Bladder  . Heart attack Brother   . Leukemia Brother   . Stroke Brother    Social History   Socioeconomic History  . Marital status: Widowed    Spouse name: Not on file  . Number of children: 0  . Years of education: Not on file  . Highest education level: Not on file  Occupational History    Employer: [employer]    Comment: Retired  Scientific laboratory technician  . Financial resource strain: Not on file  . Food insecurity:    Worry:  Not on file    Inability: Not on file  . Transportation needs:    Medical: Not on file    Non-medical: Not on file  Tobacco Use  . Smoking status: Never Smoker  . Smokeless tobacco: Never Used  Substance and Sexual Activity  . Alcohol use: No    Alcohol/week: 0.0 standard drinks  . Drug use: No  . Sexual activity: Not on file  Lifestyle  . Physical activity:    Days per week: Not on file    Minutes per session: Not on file  . Stress: Not on file  Relationships  . Social connections:    Talks on phone: Not on file    Gets together: Not on file    Attends religious service: Not on file    Active member of club or organization: Not on file    Attends meetings of clubs or organizations: Not on file    Relationship status: Not on file  Other Topics Concern  . Not on file    Social History Narrative   Lost husband November 2004.   Sometimes walks for exercise.   Planning trip to [place] 7/10.    Outpatient Encounter Medications as of 12/14/2017  Medication Sig  . albuterol (PROVENTIL HFA;VENTOLIN HFA) 108 (90 BASE) MCG/ACT inhaler Inhale 2 puffs into the lungs every 4 (four) hours as needed for wheezing or shortness of breath.  Marland Kitchen aspirin 81 MG tablet Take 81 mg by mouth daily.    . Bepotastine Besilate (BEPREVE) 1.5 % SOLN Place 1 drop into both eyes as needed.  . Cholecalciferol (VITAMIN D-3) 1000 UNITS CAPS Take 2 capsules by mouth daily.  . fish oil-omega-3 fatty acids 1000 MG capsule Take 1 g by mouth 2 (two) times daily.   . Fluticasone-Salmeterol (ADVAIR DISKUS) 250-50 MCG/DOSE AEPB Inhale 1 puff into the lungs every 12 (twelve) hours.   Marland Kitchen levocetirizine (XYZAL) 5 MG tablet Take 5 mg by mouth at bedtime.  . meloxicam (MOBIC) 15 MG tablet TAKE 1 TABLET (15 MG TOTAL) DAILY BY MOUTH.  . Misc Natural Products (GLUCOSAMINE CHONDROITIN VIT D3 PO) Take 1 capsule by mouth daily.  . montelukast (SINGULAIR) 10 MG tablet Take 10 mg by mouth daily.    . Naproxen Sodium (ALEVE) 220 MG CAPS Take by mouth daily as needed.  Marland Kitchen omeprazole (PRILOSEC) 20 MG capsule Take 1 capsule (20 mg total) by mouth daily.   No facility-administered encounter medications on file as of 12/14/2017.     Activities of Daily Living In your present state of health, do you have any difficulty performing the following activities: 12/14/2017  Hearing? Y  Vision? N  Difficulty concentrating or making decisions? N  Walking or climbing stairs? Y  Dressing or bathing? N  Doing errands, shopping? N  Preparing Food and eating ? N  Using the Toilet? N  In the past six months, have you accidently leaked urine? Y  Do you have problems with loss of bowel control? N  Managing your Medications? N  Managing your Finances? N  Housekeeping or managing your Housekeeping? N  Some recent data might be  hidden    Patient Care Team: Tower, Wynelle Fanny, MD as PCP - General    Assessment:   This is a routine wellness examination for [facility].   Hearing Screening   125Hz  250Hz  500Hz  1000Hz  2000Hz  3000Hz  4000Hz  6000Hz  8000Hz   Right ear:   40 40 40  0    Left ear:   40 40  40  0    Vision Screening Comments: Vision exam in 2019    Exercise Activities and Dietary recommendations Current Exercise Habits: Home exercise routine, Type of exercise: walking, Time (Minutes): 30, Frequency (Times/Week): 3, Weekly Exercise (Minutes/Week): 90, Intensity: Moderate, Exercise limited by: None identified  Goals    . Increase physical activity     As weather permits, I will attempt to walk at least 30 minutes 2-3 days per week.        Fall Risk Fall Risk  12/14/2017 12/13/2016 01/28/2015 12/04/2012  Falls in the past year? No No No No   Depression Screen PHQ 2/9 Scores 12/14/2017 12/13/2016 01/28/2015 12/04/2012  PHQ - 2 Score 2 0 0 0  PHQ- 9 Score 2 0 - -     Cognitive Function MMSE - Mini Mental State Exam 12/14/2017 12/13/2016  Orientation to time 5 5  Orientation to Place 5 5  Registration 3 3  Attention/ Calculation 0 0  Recall 3 3  Language- name 2 objects 0 0  Language- repeat 1 1  Language- follow 3 step command 3 3  Language- read & follow direction 0 0  Write a sentence 0 0  Copy design 0 0  Total score 20 20     PLEASE NOTE: A Mini-Cog screen was completed. Maximum score is 20. A value of 0 denotes this part of [place] MMSE was not completed or the patient failed this part of the Mini-Cog screening.   Mini-Cog Screening Orientation to Time - Max 5 pts Orientation to Place - Max 5 pts Registration - Max 3 pts Recall - Max 3 pts Language Repeat - Max 1 pts Language Follow 3 Step Command - Max 3 pts     Immunization History  Administered Date(s) Administered  . Influenza Split 12/10/2010, 12/03/2011  . Influenza, High Dose Seasonal PF 12/13/2016  . Influenza,inj,Quad PF,6+  Mos 11/27/2012, 12/11/2013, 01/01/2015, 12/12/2015, 12/14/2017  . Pneumococcal Conjugate-13 01/28/2015  . Pneumococcal Polysaccharide-23 04/21/2011  . Td 04/21/2011  . Zoster 12/13/2013    Screening Tests Health Maintenance  Topic Date Due  . MAMMOGRAM  09/15/2018  . TETANUS/TDAP  04/20/2021  . INFLUENZA VACCINE  Completed  . DEXA SCAN  Completed  . PNA vac Low Risk Adult  Completed      Plan:  I have personally reviewed, addressed, and noted the following in the patient's chart:  A. Medical and social history B. Use of alcohol, tobacco or illicit drugs  C. Current medications and supplements D. Functional ability and status E.  Nutritional status F.  Physical activity G. Advance directives H. List of other physicians I.  Hospitalizations, surgeries, and ER visits in previous 12 months J.  [place] to include hearing, vision, cognitive, depression L. Referrals and appointments - none  In addition, I have reviewed and discussed with patient certain preventive protocols, quality metrics, and best practice recommendations. A written personalized care plan for preventive services as well as general preventive health recommendations were provided to patient.  See attached scanned questionnaire for additional information.   Signed,   Lindell Noe, MHA, BS, LPN Health Coach

## 2017-12-14 NOTE — Progress Notes (Signed)
PCP notes:   Health maintenance:  Flu vaccine - administered  Abnormal screenings:   Hearing - failed  Hearing Screening   125Hz  250Hz  500Hz  1000Hz  2000Hz  3000Hz  4000Hz  6000Hz  8000Hz   Right ear:   40 40 40  0    Left ear:   40 40 40  0     Depression score: 2 Depression screen [facility] 2/9 12/14/2017 12/13/2016 01/28/2015 12/04/2012  Decreased Interest 0 0 0 0  Down, Depressed, Hopeless 2 0 0 0  PHQ - 2 Score 2 0 0 0  Altered sleeping 0 0 - -  Tired, decreased energy 0 0 - -  Change in appetite 0 0 - -  Feeling bad or failure about yourself  0 0 - -  Trouble concentrating 0 0 - -  Moving slowly or fidgety/restless 0 0 - -  Suicidal thoughts 0 0 - -  PHQ-9 Score 2 0 - -  Difficult doing work/chores Not difficult at all Not difficult at all - -   Patient concerns:   None  Nurse concerns:  None  Next PCP appt:   12/21/17 @ 1130 I reviewed health advisor's note, was available for consultation, and agree with documentation and plan. Loura Pardon MD

## 2017-12-14 NOTE — Patient Instructions (Signed)
Erika Evans , Thank you for taking time to come for your Medicare Wellness Visit. I appreciate your ongoing commitment to your health goals. Please review the following plan we discussed and let me know if I can assist you in the future.   These are the goals we discussed: Goals    . Increase physical activity     As weather permits, I will attempt to walk at least 30 minutes 2-3 days per week.        This is a list of the screening recommended for you and due dates:  Health Maintenance  Topic Date Due  . Mammogram  09/15/2018  . Tetanus Vaccine  04/20/2021  . Flu Shot  Completed  . DEXA scan (bone density measurement)  Completed  . Pneumonia vaccines  Completed   Preventive Care for Adults  A healthy lifestyle and preventive care can promote health and wellness. Preventive health guidelines for adults include the following key practices.  . A routine yearly physical is a good way to check with your health care provider about your health and preventive screening. It is a chance to share any concerns and updates on your health and to receive a thorough exam.  . Visit your dentist for a routine exam and preventive care every 6 months. Brush your teeth twice a day and floss once a day. Good oral hygiene prevents tooth decay and gum disease.  . The frequency of eye exams is based on your age, health, family medical history, use  of contact lenses, and other factors. Follow your health care provider's recommendations for frequency of eye exams.  . Eat a healthy diet. Foods like vegetables, fruits, whole grains, low-fat dairy products, and lean protein foods contain the nutrients you need without too many calories. Decrease your intake of foods high in solid fats, added sugars, and salt. Eat the right amount of calories for you. Get information about a proper diet from your health care provider, if necessary.  . Regular physical exercise is one of the most important things you can do for  your health. Most adults should get at least 150 minutes of moderate-intensity exercise (any activity that increases your heart rate and causes you to sweat) each week. In addition, most adults need muscle-strengthening exercises on 2 or more days a week.  Silver Sneakers may be a benefit available to you. To determine eligibility, you may visit the website: www.silversneakers.com or contact program at 6702524367 Mon-Fri between 8AM-8PM.   . Maintain a healthy weight. The body mass index (BMI) is a screening tool to identify possible weight problems. It provides an estimate of body fat based on height and weight. Your health care provider can find your BMI and can help you achieve or maintain a healthy weight.   For adults 20 years and older: ? A BMI below 18.5 is considered underweight. ? A BMI of 18.5 to 24.9 is normal. ? A BMI of 25 to 29.9 is considered overweight. ? A BMI of 30 and above is considered obese.   . Maintain normal blood lipids and cholesterol levels by exercising and minimizing your intake of saturated fat. Eat a balanced diet with plenty of fruit and vegetables. Blood tests for lipids and cholesterol should begin at age 46 and be repeated every 5 years. If your lipid or cholesterol levels are high, you are over 50, or you are at high risk for heart disease, you may need your cholesterol levels checked more frequently. Ongoing high lipid  and cholesterol levels should be treated with medicines if diet and exercise are not working.  . If you smoke, find out from your health care provider how to quit. If you do not use tobacco, please do not start.  . If you choose to drink alcohol, please do not consume more than 2 drinks per day. One drink is considered to be 12 ounces (355 mL) of beer, 5 ounces (148 mL) of wine, or 1.5 ounces (44 mL) of liquor.  . If you are 12-30 years old, ask your health care provider if you should take aspirin to prevent strokes.  . Use sunscreen.  Apply sunscreen liberally and repeatedly throughout the day. You should seek shade when your shadow is shorter than you. Protect yourself by wearing long sleeves, pants, a wide-brimmed hat, and sunglasses year round, whenever you are outdoors.  . Once a month, do a whole body skin exam, using a mirror to look at the skin on your back. Tell your health care provider of new moles, moles that have irregular borders, moles that are larger than a pencil eraser, or moles that have changed in shape or color.

## 2017-12-21 ENCOUNTER — Ambulatory Visit (INDEPENDENT_AMBULATORY_CARE_PROVIDER_SITE_OTHER): Payer: Medicare Other | Admitting: Family Medicine

## 2017-12-21 ENCOUNTER — Encounter: Payer: Self-pay | Admitting: Family Medicine

## 2017-12-21 VITALS — BP 114/66 | HR 72 | Temp 98.1°F | Ht 60.0 in | Wt 146.8 lb

## 2017-12-21 DIAGNOSIS — M8589 Other specified disorders of bone density and structure, multiple sites: Secondary | ICD-10-CM

## 2017-12-21 DIAGNOSIS — E7800 Pure hypercholesterolemia, unspecified: Secondary | ICD-10-CM | POA: Diagnosis not present

## 2017-12-21 DIAGNOSIS — Z0000 Encounter for general adult medical examination without abnormal findings: Secondary | ICD-10-CM | POA: Diagnosis not present

## 2017-12-21 DIAGNOSIS — I872 Venous insufficiency (chronic) (peripheral): Secondary | ICD-10-CM | POA: Diagnosis not present

## 2017-12-21 NOTE — Assessment & Plan Note (Signed)
No falls or fractures Lowest Ts score -1.9 in 12/18 She has been intol to bisphosphonate and evista and miacalcin unfortunately  Fairly active  On ca and D  ? If prolia would be opt in the future

## 2017-12-21 NOTE — Assessment & Plan Note (Signed)
Disc goals for lipids and reasons to control them Rev last labs with pt Rev low sat fat diet in detail LDL is up over 130  She wants to avoid medication  Disc diet in detail and handout given  Continue to follow

## 2017-12-21 NOTE — Progress Notes (Signed)
Subjective:    Patient ID: Erika Evans, female    DOB: [birth year], 83 y.o.   MRN: [id number]  HPI Here for health maintenance exam and to review chronic medical problems    Feeling ok overall  Aches and pains   More pain in her legs and swelling in her feet   Has bad veins  Also plantar fasciitis    Wt Readings from Last 3 Encounters:  12/21/17 146 lb 12 oz (66.6 kg)  12/14/17 144 lb 12 oz (65.7 kg)  12/20/16 148 lb 8 oz (67.4 kg)  wt is stable  28.66 kg/m   Had amw on 10/9 Given flu vaccine Missed highest tone on hearing screen- no change  Does not want hearing aides currently  Depression score is 2  Mammogram 7/19- nl  Self breast exam - no lumps or changes   dexa 12/18 Osteopenia worse in FN lowest T score -1.9 No falls or fx  Tried fosamax /evita and mialcin - intolerant of all of them   zostavax 10/15  Colon screening  cologuard neg 11/17   Hyperlipidemia  Lab Results  Component Value Date   CHOL 214 (H) 12/14/2017   CHOL 206 (H) 12/13/2016   CHOL 201 (H) 12/08/2015   Lab Results  Component Value Date   HDL 67.90 12/14/2017   HDL 69.90 12/13/2016   HDL 71.40 12/08/2015   Lab Results  Component Value Date   LDLCALC 132 (H) 12/14/2017   LDLCALC 124 (H) 12/13/2016   LDLCALC 118 (H) 12/08/2015   Lab Results  Component Value Date   TRIG 72.0 12/14/2017   TRIG 60.0 12/13/2016   TRIG 56.0 12/08/2015   Lab Results  Component Value Date   CHOLHDL 3 12/14/2017   CHOLHDL 3 12/13/2016   CHOLHDL 3 12/08/2015   Lab Results  Component Value Date   LDLDIRECT 123.5 11/27/2012   LDLDIRECT 146.1 11/25/2011   LDLDIRECT 144.2 04/21/2011  has been eating more desserts  Does not want chol medication  She wants to work on diet  Does not tolerate much oil/grease -avoids Uses a little     BP BP Readings from Last 3 Encounters:  12/21/17 114/66  12/14/17 110/70  01/13/17 134/72   Pulse Readings from Last 3 Encounters:  12/21/17 72    12/14/17 70  01/13/17 75    Other labs Results for orders placed or performed in visit on 12/14/17  TSH  Result Value Ref Range   TSH 2.13 0.35 - 4.50 uIU/mL  Lipid panel  Result Value Ref Range   Cholesterol 214 (H) 0 - 200 mg/dL   Triglycerides 72.0 0.0 - 149.0 mg/dL   HDL 67.90 >39.00 mg/dL   VLDL 14.4 0.0 - 40.0 mg/dL   LDL Cholesterol 132 (H) 0 - 99 mg/dL   Total CHOL/HDL Ratio 3    NonHDL 146.06   Comprehensive metabolic panel  Result Value Ref Range   Sodium 142 135 - 145 mEq/L   Potassium 4.0 3.5 - 5.1 mEq/L   Chloride 101 96 - 112 mEq/L   CO2 35 (H) 19 - 32 mEq/L   Glucose, Bld 90 70 - 99 mg/dL   BUN 19 6 - 23 mg/dL   Creatinine, Ser 0.87 0.40 - 1.20 mg/dL   Total Bilirubin 0.8 0.2 - 1.2 mg/dL   Alkaline Phosphatase 77 39 - 117 U/L   AST 15 0 - 37 U/L   ALT 12 0 - 35 U/L   Total Protein 7.7 6.0 -  8.3 g/dL   Albumin 4.2 3.5 - 5.2 g/dL   Calcium 10.1 8.4 - 10.5 mg/dL   GFR 66.35 >60.00 mL/min  CBC with Differential/Platelet  Result Value Ref Range   WBC 7.6 4.0 - 10.5 K/uL   RBC 4.30 3.87 - 5.11 Mil/uL   Hemoglobin 13.0 12.0 - 15.0 g/dL   HCT 39.4 36.0 - 46.0 %   MCV 91.6 78.0 - 100.0 fl   MCHC 33.1 30.0 - 36.0 g/dL   RDW 13.5 11.5 - 15.5 %   Platelets 212.0 150.0 - 400.0 K/uL   Neutrophils Relative % 50.2 43.0 - 77.0 %   Lymphocytes Relative 36.2 12.0 - 46.0 %   Monocytes Relative 10.8 3.0 - 12.0 %   Eosinophils Relative 2.5 0.0 - 5.0 %   Basophils Relative 0.3 0.0 - 3.0 %   Neutro Abs 3.8 1.4 - 7.7 K/uL   Lymphs Abs 2.8 0.7 - 4.0 K/uL   Monocytes Absolute 0.8 0.1 - 1.0 K/uL   Eosinophils Absolute 0.2 0.0 - 0.7 K/uL   Basophils Absolute 0.0 0.0 - 0.1 K/uL      Patient Active Problem List   Diagnosis Date Noted  . Venous insufficiency of both lower extremities 12/21/2017  . Routine general medical examination at a health care facility 01/28/2015  . Screening for colon cancer 01/28/2015  . Estrogen deficiency 01/28/2015  . Encounter for Medicare  annual wellness exam 11/26/2012  . GERD (gastroesophageal reflux disease) 04/21/2011  . OA (osteoarthritis) 04/21/2011  . Hyperlipidemia 04/21/2011  . Osteopenia 04/21/2011  . Overweight 04/21/2011   Past Medical History:  Diagnosis Date  . Cataract 2019   resolved with surgery  . Elevated liver enzymes   . Foot pain    Left; nerve problem; procedure  . GERD (gastroesophageal reflux disease)   . Hepatitis B carrier ([place])    Positive hep B test at [facility]  . Hyperlipidemia    Mild  . Osteoarthritis   . Osteopenia   . Pneumonia 2004   Past Surgical History:  Procedure Laterality Date  . "Blocked kidney"    . BACK SURGERY    . CARDIOVASCULAR STRESS TEST  9/07   Neg  . CATARACT EXTRACTION W/ INTRAOCULAR LENS IMPLANT Right 05/06/2017   Dr. Tommy Rainwater  . CATARACT EXTRACTION W/ INTRAOCULAR LENS IMPLANT Left 05/20/2017   Dr. Tommy Rainwater  . COLONOSCOPY  9/06   Diverticulosis, hems  . Dexa     Osteopenis 9/01, improved 10/03, decreased BMD 5/06  . PARTIAL HYSTERECTOMY    . US ECHOCARDIOGRAPHY  7/03   Abd-normal  . US RENAL/AORTA  2007   Neg   Social History   Tobacco Use  . Smoking status: Never Smoker  . Smokeless tobacco: Never Used  Substance Use Topics  . Alcohol use: No    Alcohol/week: 0.0 standard drinks  . Drug use: No   Family History  Problem Relation Age of Onset  . Lung cancer Father   . Cancer Sister        Bladder  . Heart attack Brother   . Leukemia Brother   . Stroke Brother    Allergies  Allergen Reactions  . Alendronate Sodium     REACTION: acid reflux  . Miacalcin [Calcitonin ([place])] Other (See Comments)    Pt thinks the spray gave her a staph infection in her nose   . Raloxifene     REACTION: severe muscle pain and weakness   Current Outpatient Medications on File Prior to Visit  Medication Sig Dispense Refill  . albuterol (PROVENTIL HFA;VENTOLIN HFA) 108 (90 BASE) MCG/ACT inhaler Inhale 2 puffs into the lungs every 4 (four) hours as  needed for wheezing or shortness of breath. 1 Inhaler 3  . aspirin 81 MG tablet Take 81 mg by mouth daily.      . Bepotastine Besilate (BEPREVE) 1.5 % SOLN Place 1 drop into both eyes as needed.    . Cholecalciferol (VITAMIN D-3) 1000 UNITS CAPS Take 2 capsules by mouth daily.    . fish oil-omega-3 fatty acids 1000 MG capsule Take 1 g by mouth 2 (two) times daily.     . Fluticasone-Salmeterol (ADVAIR DISKUS) 250-50 MCG/DOSE AEPB Inhale 1 puff into the lungs every 12 (twelve) hours.     Marland Kitchen levocetirizine (XYZAL) 5 MG tablet Take 5 mg by mouth at bedtime.    . meloxicam (MOBIC) 15 MG tablet TAKE 1 TABLET (15 MG TOTAL) DAILY BY MOUTH. 30 tablet 0  . montelukast (SINGULAIR) 10 MG tablet Take 10 mg by mouth daily.      . Naproxen Sodium (ALEVE) 220 MG CAPS Take by mouth daily as needed.    Marland Kitchen omeprazole (PRILOSEC) 20 MG capsule Take 1 capsule (20 mg total) by mouth daily. 90 capsule 1  . triamcinolone cream (KENALOG) 0.1 % Apply 1 application topically 2 (two) times daily as needed.     No current facility-administered medications on file prior to visit.     Review of Systems  Constitutional: Positive for fatigue. Negative for activity change, appetite change, fever and unexpected weight change.  HENT: Negative for congestion, ear pain, rhinorrhea, sinus pressure and sore throat.   Eyes: Negative for pain, redness and visual disturbance.  Respiratory: Negative for cough, shortness of breath and wheezing.   Cardiovascular: Positive for leg swelling. Negative for chest pain and palpitations.       Ankle swelling with varicose veins   Gastrointestinal: Negative for abdominal pain, blood in stool, constipation and diarrhea.  Endocrine: Negative for polydipsia and polyuria.  Genitourinary: Negative for dysuria, frequency and urgency.  Musculoskeletal: Negative for arthralgias, back pain and myalgias.       Leg pain as well as plantar fasciitis   Skin: Negative for pallor and rash.    Allergic/Immunologic: Negative for environmental allergies.  Neurological: Negative for dizziness, syncope and headaches.  Hematological: Negative for adenopathy. Does not bruise/bleed easily.  Psychiatric/Behavioral: Negative for decreased concentration and dysphoric mood. The patient is not nervous/anxious.        Mood is good       Objective:   Physical Exam  Constitutional: She appears well-developed and well-nourished. No distress.  overwt and well appearing   HENT:  Head: Normocephalic and atraumatic.  Right Ear: External ear normal.  Left Ear: External ear normal.  Mouth/Throat: Oropharynx is clear and moist.  Eyes: Pupils are equal, round, and reactive to light. Conjunctivae and EOM are normal. No scleral icterus.  Neck: Normal range of motion. Neck supple. No JVD present. Carotid bruit is not present. No thyromegaly present.  Cardiovascular: Normal rate, regular rhythm, normal heart sounds and intact distal pulses. Exam reveals no gallop.  LE varicosities diffuse below knee (compressible)  No acute skin changes Mild edema in ankles and R foot   Pulmonary/Chest: Effort normal and breath sounds normal. No respiratory distress. She has no wheezes. She has no rales. She exhibits no tenderness. No breast tenderness, discharge or bleeding.  Abdominal: Soft. Bowel sounds are normal. She exhibits no distension, no  abdominal bruit and no mass. There is no tenderness.  Genitourinary: No breast tenderness, discharge or bleeding.  Genitourinary Comments: Breast exam: No mass, nodules, thickening, tenderness, bulging, retraction, inflamation, nipple discharge or skin changes noted.  No axillary or clavicular LA.      Musculoskeletal: Normal range of motion. She exhibits edema. She exhibits no tenderness.  Mild kyphosis   Mild non pitting ankle edema  Lymphadenopathy:    She has no cervical adenopathy.  Neurological: She is alert. She has normal reflexes. She displays normal reflexes.  No cranial nerve deficit. She exhibits normal muscle tone. Coordination normal.  Skin: Skin is warm and dry. No rash noted. No erythema. No pallor.  Solar lentigines diffusely Sun damage noted Some sks  angiomas  Psychiatric: She has a normal mood and affect.  Pleasant Good mood          Assessment & Plan:   Problem List Items Addressed This Visit      Cardiovascular and Mediastinum   Venous insufficiency of both lower extremities    C/o ongoing lower leg pain and ankle/foot swelling (worse on R)  Enc strongly to use her knee high supp hose  Varicosities (compressible) on exam  Also elevate feet when sitting Avoid excessive sodium  Update if no improvement         Musculoskeletal and Integument   Osteopenia    No falls or fractures Lowest Ts score -1.9 in 12/18 She has been intol to bisphosphonate and evista and miacalcin unfortunately  Fairly active  On ca and D  ? If prolia would be opt in the future        Other   Hyperlipidemia    Disc goals for lipids and reasons to control them Rev last labs with pt Rev low sat fat diet in detail LDL is up over 130  She wants to avoid medication  Disc diet in detail and handout given  Continue to follow      Routine general medical examination at a health care facility - Primary    Reviewed health habits including diet and exercise and skin cancer prevention Reviewed appropriate screening tests for age  Also reviewed health mt list, fam hx and immunization status , as well as social and family history   See HPI Labs rev amw rev  Declines hearing aide  dexa reviewed  cologuard utd imms utd Enc use of supp hose for leg and foot symptoms and continue f/u with podiatry

## 2017-12-21 NOTE — Patient Instructions (Addendum)
I think some of your foot discomfort and swelling comes from varicose veins/ "venous insufficiency"  Start wearing the knee high support stockings during the day  Also elevate feet whenever you sit   For cholesterol Avoid red meat/ fried foods/ egg yolks/ fatty breakfast meats/ butter, cheese and high fat dairy/ and shellfish   Take a look at the handout   Keep taking good care of yourself

## 2017-12-21 NOTE — Assessment & Plan Note (Signed)
Reviewed health habits including diet and exercise and skin cancer prevention Reviewed appropriate screening tests for age  Also reviewed health mt list, fam hx and immunization status , as well as social and family history   See HPI Labs rev amw rev  Declines hearing aide  dexa reviewed  cologuard utd imms utd Enc use of supp hose for leg and foot symptoms and continue f/u with podiatry

## 2017-12-21 NOTE — Assessment & Plan Note (Signed)
C/o ongoing lower leg pain and ankle/foot swelling (worse on R)  Enc strongly to use her knee high supp hose  Varicosities (compressible) on exam  Also elevate feet when sitting Avoid excessive sodium  Update if no improvement

## 2018-01-09 ENCOUNTER — Encounter: Payer: Self-pay | Admitting: Family Medicine

## 2018-01-09 NOTE — Telephone Encounter (Signed)
Wrong pt, will route back to [facility] to f/u up on

## 2018-01-09 NOTE — Telephone Encounter (Signed)
Put ticket in to get this removed from this chart Incident # [id number]

## 2018-01-09 NOTE — Telephone Encounter (Signed)
I cannot find documentation for uti/tx or ua in chart-am I missing it?

## 2018-01-09 NOTE — Telephone Encounter (Signed)
Best number [phone] or her cell [phone]   Pt called stating she finished her rx for uti and states she doesn't think she is any better  Do you want her to come in to see you or just drop off another sample  [facility]

## 2018-03-29 ENCOUNTER — Other Ambulatory Visit: Payer: Self-pay | Admitting: Family Medicine

## 2018-05-30 ENCOUNTER — Telehealth: Payer: Self-pay | Admitting: *Deleted

## 2018-05-30 MED ORDER — DOCUSATE SODIUM 100 MG PO TABS: 100.0000 mg | ORAL_TABLET | Freq: Two times a day (BID) | ORAL | 3 refills | Status: DC | PRN

## 2018-05-30 NOTE — Telephone Encounter (Signed)
Received fax from [facility] requesting a refill of Docusate 100mg , not on med list, please advise

## 2018-10-26 ENCOUNTER — Other Ambulatory Visit: Payer: Self-pay | Admitting: Family Medicine

## 2018-10-26 DIAGNOSIS — Z1231 Encounter for screening mammogram for malignant neoplasm of breast: Secondary | ICD-10-CM

## 2018-10-27 ENCOUNTER — Ambulatory Visit
Admission: RE | Admit: 2018-10-27 | Discharge: 2018-10-27 | Disposition: A | Discharge status: Home / Self Care | Payer: Medicare Other | Source: Ambulatory Visit | Attending: Family Medicine | Admitting: Family Medicine

## 2018-10-27 ENCOUNTER — Other Ambulatory Visit: Payer: Self-pay

## 2018-10-27 DIAGNOSIS — Z1231 Encounter for screening mammogram for malignant neoplasm of breast: Secondary | ICD-10-CM

## 2018-11-02 ENCOUNTER — Telehealth: Payer: Self-pay

## 2018-11-02 ENCOUNTER — Other Ambulatory Visit: Payer: Self-pay

## 2018-11-02 DIAGNOSIS — Z20822 Contact with and (suspected) exposure to covid-19: Secondary | ICD-10-CM

## 2018-11-02 NOTE — Telephone Encounter (Signed)
If she has elevated temp or any other covid symptoms today (please review them with her)- she will need to reschedule her hair appt   (she needs to be honest about it)   I can order covid test with or w/o symptoms-just let me know She will need to isolate until results return

## 2018-11-02 NOTE — Telephone Encounter (Signed)
Since pt has cough, scratchy throat, body aches, and fever, I advised pt of DR. Tower's comments. Pt will go to testing site now and get Covid test done. Pt advised to reschedule hair appt and also isolate herself until results are back. Pt verbalized understanding

## 2018-11-02 NOTE — Telephone Encounter (Signed)
Pt said she took her temp this morning and temp was 99, pt has irritated throat, somewhat scratchy throat,dry cough that started on 10/31/18. Muscle pain but pt thinks that is arthritis. No other covid symptoms and no travel or known exposure to covid. Pt is not sure if thermometer is working; pt does not want to go to fire dept for vitals or go to UC. Pt has a hair appt today at 2pm and does not want to miss that appt. Advised pt if having fever, irritated throat or muscle pain should not go to hair appt. Pt wants to know about covid testing and I advised could send a note to Dr Glori Bickers but would not get results before 2 pm today. Pt retook her temp and said one pt over 98; I asked what is a point; pt said she is not sure what the thermometer is reading now.pt first said 44 and then said 100. Not sure what pts temp is.pt thinks the temp is a little above 98, close to 98.6. pt request cb to see if needs covid testing and if can go for hair appt today at 2 PM. Pt will need cb by 1PM.

## 2018-11-03 LAB — NOVEL CORONAVIRUS, NAA: SARS-CoV-2, NAA: NOT DETECTED

## 2018-11-03 NOTE — Telephone Encounter (Signed)
Patient notified and will keep [place] informed.

## 2018-11-03 NOTE — Telephone Encounter (Signed)
Just got it back- negative (no covid)  Good news  It will be in her mychart as well  Keep [place] posted re: symptoms

## 2018-11-03 NOTE — Telephone Encounter (Signed)
Pt left v/m wanting to know if got covid test back and is pt cleared of covid or not. Pt request cb.

## 2018-12-19 ENCOUNTER — Telehealth: Payer: Self-pay | Admitting: Family Medicine

## 2018-12-19 DIAGNOSIS — Z0000 Encounter for general adult medical examination without abnormal findings: Secondary | ICD-10-CM

## 2018-12-19 DIAGNOSIS — E7800 Pure hypercholesterolemia, unspecified: Secondary | ICD-10-CM

## 2018-12-19 NOTE — Telephone Encounter (Signed)
-----   Message from Ellamae Sia sent at 12/15/2018 11:13 AM EDT ----- Regarding: Lab orders for Wednesday, 10.14.20 Patient is scheduled for CPX labs, please order future labs, Thanks , Karna Christmas

## 2018-12-20 ENCOUNTER — Ambulatory Visit (INDEPENDENT_AMBULATORY_CARE_PROVIDER_SITE_OTHER): Payer: Medicare Other

## 2018-12-20 ENCOUNTER — Ambulatory Visit: Payer: Medicare Other

## 2018-12-20 ENCOUNTER — Other Ambulatory Visit (INDEPENDENT_AMBULATORY_CARE_PROVIDER_SITE_OTHER): Payer: Medicare Other

## 2018-12-20 DIAGNOSIS — E7800 Pure hypercholesterolemia, unspecified: Secondary | ICD-10-CM

## 2018-12-20 DIAGNOSIS — Z0000 Encounter for general adult medical examination without abnormal findings: Secondary | ICD-10-CM | POA: Diagnosis not present

## 2018-12-20 LAB — COMPREHENSIVE METABOLIC PANEL
ALT: 14 U/L (ref 0–35)
AST: 17 U/L (ref 0–37)
Albumin: 4.2 g/dL (ref 3.5–5.2)
Alkaline Phosphatase: 85 U/L (ref 39–117)
BUN: 13 mg/dL (ref 6–23)
CO2: 33 mEq/L — ABNORMAL HIGH (ref 19–32)
Calcium: 9.9 mg/dL (ref 8.4–10.5)
Chloride: 100 mEq/L (ref 96–112)
Creatinine, Ser: 0.79 mg/dL (ref 0.40–1.20)
GFR: 69.6 mL/min (ref >60.00)
Glucose, Bld: 92 mg/dL (ref 70–99)
Potassium: 3.9 mEq/L (ref 3.5–5.1)
Sodium: 141 mEq/L (ref 135–145)
Total Bilirubin: 0.6 mg/dL (ref 0.2–1.2)
Total Protein: 7.4 g/dL (ref 6.0–8.3)

## 2018-12-20 LAB — LIPID PANEL
Cholesterol: 214 mg/dL — ABNORMAL HIGH (ref 0–200)
HDL: 61 mg/dL (ref >39.00)
LDL Cholesterol: 129 mg/dL — ABNORMAL HIGH (ref 0–99)
NonHDL: 153.08
Total CHOL/HDL Ratio: 4
Triglycerides: 121 mg/dL (ref 0.0–149.0)
VLDL: 24.2 mg/dL (ref 0.0–40.0)

## 2018-12-20 LAB — CBC WITH DIFFERENTIAL/PLATELET
Basophils Absolute: 0 10*3/uL (ref 0.0–0.1)
Basophils Relative: 0.3 % (ref 0.0–3.0)
Eosinophils Absolute: 0.1 10*3/uL (ref 0.0–0.7)
Eosinophils Relative: 1.2 % (ref 0.0–5.0)
HCT: 39.3 % (ref 36.0–46.0)
Hemoglobin: 12.9 g/dL (ref 12.0–15.0)
Lymphocytes Relative: 41 % (ref 12.0–46.0)
Lymphs Abs: 3.4 10*3/uL (ref 0.7–4.0)
MCHC: 32.7 g/dL (ref 30.0–36.0)
MCV: 92.6 fl (ref 78.0–100.0)
Monocytes Absolute: 0.9 10*3/uL (ref 0.1–1.0)
Monocytes Relative: 10.2 % (ref 3.0–12.0)
Neutro Abs: 4 10*3/uL (ref 1.4–7.7)
Neutrophils Relative %: 47.3 % (ref 43.0–77.0)
Platelets: 218 10*3/uL (ref 150.0–400.0)
RBC: 4.24 Mil/uL (ref 3.87–5.11)
RDW: 13.5 % (ref 11.5–15.5)
WBC: 8.4 10*3/uL (ref 4.0–10.5)

## 2018-12-20 LAB — TSH: TSH: 1.7 u[IU]/mL (ref 0.35–4.50)

## 2018-12-20 NOTE — Progress Notes (Signed)
Subjective:   BENETA WEICHMAN is a 84 y.o. female who presents for Medicare Annual (Subsequent) preventive examination.  Review of Systems:    This visit is being conducted through telemedicine via telephone at the nurse health advisor's home address due to the COVID-19 pandemic. This patient has given me verbal consent via doximity to conduct this visit, patient states they are participating from their home address. Some vital signs may be absent or patient reported.    Patient identification: identified by name, DOB, and current address  Cardiac Risk Factors include: advanced age (>51men, >64 women);dyslipidemia;sedentary lifestyle     Objective:     Vitals: There were no vitals taken for this visit.  There is no height or weight on file to calculate BMI.  Advanced Directives 12/20/2018 12/14/2017 12/13/2016  Does Patient Have a Medical Advance Directive? Yes No Yes  Type of Paramedic of [place];Living will - [place];Living will  Copy of [place] in Chart? No - copy requested - No - copy requested  Would patient like information on creating a medical advance directive? - No - Patient declined -    Tobacco Social History   Tobacco Use  Smoking Status Never Smoker  Smokeless Tobacco Never Used     Counseling given: Not Answered   Clinical Intake:  Pre-visit preparation completed: Yes  Pain : No/denies pain     Nutritional Risks: None Diabetes: No  How often do you need to have someone help you when you read instructions, pamphlets, or other written materials from your doctor or pharmacy?: 1 - Never What is the last grade level you completed in school?: some college  Interpreter Needed?: No  Information entered by :: CJohnson, LPN  Past Medical History:  Diagnosis Date  . Cataract 2019   resolved with surgery  . Elevated liver enzymes   . Foot pain    Left; nerve problem; procedure  . GERD  (gastroesophageal reflux disease)   . Hepatitis B carrier ([place])    Positive hep B test at [facility]  . Hyperlipidemia    Mild  . Osteoarthritis   . Osteopenia   . Pneumonia 2004   Past Surgical History:  Procedure Laterality Date  . "Blocked kidney"    . BACK SURGERY    . CARDIOVASCULAR STRESS TEST  9/07   Neg  . CATARACT EXTRACTION W/ INTRAOCULAR LENS IMPLANT Right 05/06/2017   Dr. Tommy Rainwater  . CATARACT EXTRACTION W/ INTRAOCULAR LENS IMPLANT Left 05/20/2017   Dr. Tommy Rainwater  . COLONOSCOPY  9/06   Diverticulosis, hems  . Dexa     Osteopenis 9/01, improved 10/03, decreased BMD 5/06  . PARTIAL HYSTERECTOMY    . US ECHOCARDIOGRAPHY  7/03   Abd-normal  . US RENAL/AORTA  2007   Neg   Family History  Problem Relation Age of Onset  . Lung cancer Father   . Cancer Sister        Bladder  . Heart attack Brother   . Leukemia Brother   . Stroke Brother    Social History   Socioeconomic History  . Marital status: Widowed    Spouse name: Not on file  . Number of children: 0  . Years of education: Not on file  . Highest education level: Not on file  Occupational History    Employer: [employer]    Comment: Retired  Scientific laboratory technician  . Financial resource strain: Not hard at all  . Food insecurity  Worry: Never true    Inability: Never true  . Transportation needs    Medical: No    Non-medical: No  Tobacco Use  . Smoking status: Never Smoker  . Smokeless tobacco: Never Used  Substance and Sexual Activity  . Alcohol use: No    Alcohol/week: 0.0 standard drinks  . Drug use: No  . Sexual activity: Not on file  Lifestyle  . Physical activity    Days per week: 0 days    Minutes per session: 0 min  . Stress: Not at all  Relationships  . Social Herbalist on phone: Not on file    Gets together: Not on file    Attends religious service: Not on file    Active member of club or organization: Not on file    Attends meetings of clubs or organizations: Not on file     Relationship status: Not on file  Other Topics Concern  . Not on file  Social History Narrative   Lost husband November 2004.   Sometimes walks for exercise.   Planning trip to [place] 7/10.    Outpatient Encounter Medications as of 12/20/2018  Medication Sig  . albuterol (PROVENTIL HFA;VENTOLIN HFA) 108 (90 BASE) MCG/ACT inhaler Inhale 2 puffs into the lungs every 4 (four) hours as needed for wheezing or shortness of breath.  Marland Kitchen aspirin 81 MG tablet Take 81 mg by mouth daily.    . Bepotastine Besilate (BEPREVE) 1.5 % SOLN Place 1 drop into both eyes as needed.  . Cholecalciferol (VITAMIN D-3) 1000 UNITS CAPS Take 2 capsules by mouth daily.  Mariane Baumgarten Sodium 100 MG capsule Take 1 tablet (100 mg total) by mouth 2 (two) times daily as needed for constipation.  . fish oil-omega-3 fatty acids 1000 MG capsule Take 1 g by mouth 2 (two) times daily.   . Fluticasone-Salmeterol (ADVAIR DISKUS) 250-50 MCG/DOSE AEPB Inhale 1 puff into the lungs every 12 (twelve) hours.   Marland Kitchen levocetirizine (XYZAL) 5 MG tablet Take 5 mg by mouth at bedtime.  . meloxicam (MOBIC) 15 MG tablet TAKE 1 TABLET (15 MG TOTAL) DAILY BY MOUTH.  . montelukast (SINGULAIR) 10 MG tablet Take 10 mg by mouth daily.    . Naproxen Sodium (ALEVE) 220 MG CAPS Take by mouth daily as needed.  Marland Kitchen omeprazole (PRILOSEC) 20 MG capsule TAKE 1 CAPSULE BY MOUTH EVERY DAY  . triamcinolone cream (KENALOG) 0.1 % Apply 1 application topically 2 (two) times daily as needed.  . Zinc 25 MG TABS Take 1 tablet by mouth daily.   No facility-administered encounter medications on file as of 12/20/2018.     Activities of Daily Living In your present state of health, do you have any difficulty performing the following activities: 12/20/2018  Hearing? N  Vision? N  Difficulty concentrating or making decisions? N  Walking or climbing stairs? Y  Comment walks very slow sometimes.  Dressing or bathing? N  Doing errands, shopping? N  Preparing Food and  eating ? N  Using the Toilet? N  In the past six months, have you accidently leaked urine? Y  Comment wears pad daily  Do you have problems with loss of bowel control? N  Managing your Medications? N  Managing your Finances? N  Housekeeping or managing your Housekeeping? N  Some recent data might be hidden    Patient Care Team: Tower, Wynelle Fanny, MD as PCP - General    Assessment:   This is a routine wellness  examination for [facility].  Exercise Activities and Dietary recommendations Current Exercise Habits: The patient does not participate in regular exercise at present, Exercise limited by: None identified  Goals    . Increase physical activity     As weather permits, I will attempt to walk at least 30 minutes 2-3 days per week.     . Patient Stated     12/20/2018, I will start trying to exercise more daily.       Fall Risk Fall Risk  12/20/2018 12/14/2017 12/13/2016 01/28/2015 12/04/2012  Falls in the past year? 0 No No No No  Risk for fall due to : Medication side effect;Impaired balance/gait - - - -  Follow up Falls evaluation completed;Falls prevention discussed - - - -   Is the patient's home free of loose throw rugs in walkways, pet beds, electrical cords, etc?   yes      Grab bars in the bathroom? no      Handrails on the stairs?   yes      Adequate lighting?   yes  Timed Get Up and Go performed: N/A  Depression Screen PHQ 2/9 Scores 12/20/2018 12/14/2017 12/13/2016 01/28/2015  PHQ - 2 Score 0 2 0 0  PHQ- 9 Score 0 2 0 -     Cognitive Function MMSE - Mini Mental State Exam 12/20/2018 12/14/2017 12/13/2016  Orientation to time 5 5 5   Orientation to Place 5 5 5   Registration 3 3 3   Attention/ Calculation 5 0 0  Recall 3 3 3   Language- name 2 objects - 0 0  Language- repeat 1 1 1   Language- follow 3 step command - 3 3  Language- read & follow direction - 0 0  Write a sentence - 0 0  Copy design - 0 0  Total score - 20 20  Mini Cog  Mini-Cog screen was  completed. Maximum score is 22. A value of 0 denotes this part of the MMSE was not completed or the patient failed this part of the Mini-Cog screening.      Immunization History  Administered Date(s) Administered  . Fluad Quad(high Dose 65+) 12/12/2018  . Influenza Split 12/10/2010, 12/03/2011  . Influenza, High Dose Seasonal PF 12/13/2016  . Influenza,inj,Quad PF,6+ Mos 11/27/2012, 12/11/2013, 01/01/2015, 12/12/2015, 12/14/2017  . Pneumococcal Conjugate-13 01/28/2015  . Pneumococcal Polysaccharide-23 04/21/2011  . Td 04/21/2011  . Zoster 12/13/2013    Qualifies for Shingles Vaccine? yes  Screening Tests Health Maintenance  Topic Date Due  . MAMMOGRAM  10/27/2019  . TETANUS/TDAP  04/20/2021  . INFLUENZA VACCINE  Completed  . DEXA SCAN  Completed  . PNA vac Low Risk Adult  Completed    Cancer Screenings: Lung: Low Dose CT Chest recommended if Age 60-80 years, 30 pack-year currently smoking OR have quit w/in 15years. Patient does not qualify. Breast:  Up to date on Mammogram? Yes, completed 10/27/2018   Up to date of Bone Density/Dexa? Yes, completed 02/10/2017 Colorectal: no longer required  Additional Screenings:  Hepatitis C Screening: n/a     Plan:    Patient will start trying to exercise more daily.   I have personally reviewed and noted the following in the patient's chart:   . Medical and social history . Use of alcohol, tobacco or illicit drugs  . Current medications and supplements . Functional ability and status . Nutritional status . Physical activity . Advanced directives . List of other physicians . Hospitalizations, surgeries, and ER visits in previous 12 months .  Vitals . Screenings to include cognitive, depression, and falls . Referrals and appointments  In addition, I have reviewed and discussed with patient certain preventive protocols, quality metrics, and best practice recommendations. A written personalized care plan for preventive services as  well as general preventive health recommendations were provided to patient.     Andrez Grime, LPN  [id number]

## 2018-12-20 NOTE — Progress Notes (Signed)
PCP notes: none  Health Maintenance: Advised to check with pharmacy concerning Shingrix vaccine.     Abnormal Screenings: none    Patient concerns: none    Nurse concerns: none    Next PCP appt.: 12/27/2018 @ 2:30 pm

## 2018-12-20 NOTE — Patient Instructions (Signed)
Erika Evans , Thank you for taking time to come for your Medicare Wellness Visit. I appreciate your ongoing commitment to your health goals. Please review the following plan we discussed and let me know if I can assist you in the future.   Screening recommendations/referrals: Colonoscopy: no longer required Mammogram: up to date, completed 10/27/2018 Bone Density: up to date, completed 02/10/2017 Recommended yearly ophthalmology/optometry visit for glaucoma screening and checkup Recommended yearly dental visit for hygiene and checkup  Vaccinations: Influenza vaccine: up to date, completed 12/12/2018 Pneumococcal vaccine: series completed  Tdap vaccine: up to date, completed 04/21/2011 Shingles vaccine: declined    Advanced directives: Please bring a copy of your POA (Power of [place]) and/or Living Will to your next appointment.   Conditions/risks identified: hyperlipidemia  Next appointment: 12/27/2018 @ 2:30 pm   Preventive Care 65 Years and Older, Female Preventive care refers to lifestyle choices and visits with your health care provider that can promote health and wellness. What does preventive care include?  A yearly physical exam. This is also called an annual well check.  Dental exams once or twice a year.  Routine eye exams. Ask your health care provider how often you should have your eyes checked.  Personal lifestyle choices, including:  Daily care of your teeth and gums.  Regular physical activity.  Eating a healthy diet.  Avoiding tobacco and drug use.  Limiting alcohol use.  Practicing safe sex.  Taking low-dose aspirin every day.  Taking vitamin and mineral supplements as recommended by your health care provider. What happens during an annual well check? The services and screenings done by your health care provider during your annual well check will depend on your age, overall health, lifestyle risk factors, and family history of disease. Counseling   Your health care provider may ask you questions about your:  Alcohol use.  Tobacco use.  Drug use.  Emotional well-being.  Home and relationship well-being.  Sexual activity.  Eating habits.  History of falls.  Memory and ability to understand (cognition).  Work and work Statistician.  Reproductive health. Screening  You may have the following tests or measurements:  Height, weight, and BMI.  Blood pressure.  Lipid and cholesterol levels. These may be checked every 5 years, or more frequently if you are over 24 years old.  Skin check.  Lung cancer screening. You may have this screening every year starting at age 27 if you have a 30-pack-year history of smoking and currently smoke or have quit within the past 15 years.  Fecal occult blood test (FOBT) of the stool. You may have this test every year starting at age 19.  Flexible sigmoidoscopy or colonoscopy. You may have a sigmoidoscopy every 5 years or a colonoscopy every 10 years starting at age 57.  Hepatitis C blood test.  Hepatitis B blood test.  Sexually transmitted disease (STD) testing.  Diabetes screening. This is done by checking your blood sugar (glucose) after you have not eaten for a while (fasting). You may have this done every 1-3 years.  Bone density scan. This is done to screen for osteoporosis. You may have this done starting at age 28.  Mammogram. This may be done every 1-2 years. Talk to your health care provider about how often you should have regular mammograms. Talk with your health care provider about your test results, treatment options, and if necessary, the need for more tests. Vaccines  Your health care provider may recommend certain vaccines, such as:  Influenza vaccine.  This is recommended every year.  Tetanus, diphtheria, and acellular pertussis (Tdap, Td) vaccine. You may need a Td booster every 10 years.  Zoster vaccine. You may need this after age 58.  Pneumococcal 13-valent  conjugate (PCV13) vaccine. One dose is recommended after age 4.  Pneumococcal polysaccharide (PPSV23) vaccine. One dose is recommended after age 62. Talk to your health care provider about which screenings and vaccines you need and how often you need them. This information is not intended to replace advice given to you by your health care provider. Make sure you discuss any questions you have with your health care provider. Document Released: 03/21/2015 Document Revised: 11/12/2015 Document Reviewed: 12/24/2014 Elsevier Interactive Patient Education  2017 [place] Prevention in the Home Falls can cause injuries. They can happen to people of all ages. There are many things you can do to make your home safe and to help prevent falls. What can I do on the outside of my home?  Regularly fix the edges of walkways and driveways and fix any cracks.  Remove anything that might make you trip as you walk through a door, such as a raised step or threshold.  Trim any bushes or trees on the path to your home.  Use bright outdoor lighting.  Clear any walking paths of anything that might make someone trip, such as rocks or tools.  Regularly check to see if handrails are loose or broken. Make sure that both sides of any steps have handrails.  Any raised decks and porches should have guardrails on the edges.  Have any leaves, snow, or ice cleared regularly.  Use sand or salt on walking paths during winter.  Clean up any spills in your garage right away. This includes oil or grease spills. What can I do in the bathroom?  Use night lights.  Install grab bars by the toilet and in the tub and shower. Do not use towel bars as grab bars.  Use non-skid mats or decals in the tub or shower.  If you need to sit down in the shower, use a plastic, non-slip stool.  Keep the floor dry. Clean up any water that spills on the floor as soon as it happens.  Remove soap buildup in the tub or  shower regularly.  Attach bath mats securely with double-sided non-slip rug tape.  Do not have throw rugs and other things on the floor that can make you trip. What can I do in the bedroom?  Use night lights.  Make sure that you have a light by your bed that is easy to reach.  Do not use any sheets or blankets that are too big for your bed. They should not hang down onto the floor.  Have a firm chair that has side arms. You can use this for support while you get dressed.  Do not have throw rugs and other things on the floor that can make you trip. What can I do in the kitchen?  Clean up any spills right away.  Avoid walking on wet floors.  Keep items that you use a lot in easy-to-reach places.  If you need to reach something above you, use a strong step stool that has a grab bar.  Keep electrical cords out of the way.  Do not use floor polish or wax that makes floors slippery. If you must use wax, use non-skid floor wax.  Do not have throw rugs and other things on the floor that can make  you trip. What can I do with my stairs?  Do not leave any items on the stairs.  Make sure that there are handrails on both sides of the stairs and use them. Fix handrails that are broken or loose. Make sure that handrails are as long as the stairways.  Check any carpeting to make sure that it is firmly attached to the stairs. Fix any carpet that is loose or worn.  Avoid having throw rugs at the top or bottom of the stairs. If you do have throw rugs, attach them to the floor with carpet tape.  Make sure that you have a light switch at the top of the stairs and the bottom of the stairs. If you do not have them, ask someone to add them for you. What else can I do to help prevent falls?  Wear shoes that:  Do not have high heels.  Have rubber bottoms.  Are comfortable and fit you well.  Are closed at the toe. Do not wear sandals.  If you use a stepladder:  Make sure that it is fully  opened. Do not climb a closed stepladder.  Make sure that both sides of the stepladder are locked into place.  Ask someone to hold it for you, if possible.  Clearly mark and make sure that you can see:  Any grab bars or handrails.  First and last steps.  Where the edge of each step is.  Use tools that help you move around (mobility aids) if they are needed. These include:  Canes.  Walkers.  Scooters.  Crutches.  Turn on the lights when you go into a dark area. Replace any light bulbs as soon as they burn out.  Set up your furniture so you have a clear path. Avoid moving your furniture around.  If any of your floors are uneven, fix them.  If there are any pets around you, be aware of where they are.  Review your medicines with your doctor. Some medicines can make you feel dizzy. This can increase your chance of falling. Ask your doctor what other things that you can do to help prevent falls. This information is not intended to replace advice given to you by your health care provider. Make sure you discuss any questions you have with your health care provider. Document Released: 12/19/2008 Document Revised: 07/31/2015 Document Reviewed: 03/29/2014 Elsevier Interactive Patient Education  2017 [employer].

## 2018-12-27 ENCOUNTER — Other Ambulatory Visit: Payer: Self-pay

## 2018-12-27 ENCOUNTER — Ambulatory Visit (INDEPENDENT_AMBULATORY_CARE_PROVIDER_SITE_OTHER): Payer: Medicare Other | Admitting: Family Medicine

## 2018-12-27 ENCOUNTER — Encounter: Payer: Self-pay | Admitting: Family Medicine

## 2018-12-27 VITALS — BP 122/80 | HR 78 | Temp 97.7°F | Ht 60.0 in | Wt 154.1 lb

## 2018-12-27 DIAGNOSIS — E7800 Pure hypercholesterolemia, unspecified: Secondary | ICD-10-CM

## 2018-12-27 DIAGNOSIS — M8589 Other specified disorders of bone density and structure, multiple sites: Secondary | ICD-10-CM | POA: Diagnosis not present

## 2018-12-27 DIAGNOSIS — E6609 Other obesity due to excess calories: Secondary | ICD-10-CM

## 2018-12-27 DIAGNOSIS — K219 Gastro-esophageal reflux disease without esophagitis: Secondary | ICD-10-CM

## 2018-12-27 DIAGNOSIS — Z6830 Body mass index (BMI) 30.0-30.9, adult: Secondary | ICD-10-CM

## 2018-12-27 DIAGNOSIS — Z1211 Encounter for screening for malignant neoplasm of colon: Secondary | ICD-10-CM

## 2018-12-27 DIAGNOSIS — Z0000 Encounter for general adult medical examination without abnormal findings: Secondary | ICD-10-CM

## 2018-12-27 MED ORDER — OMEPRAZOLE 20 MG PO CPDR: 20.0000 mg | DELAYED_RELEASE_CAPSULE | Freq: Two times a day (BID) | ORAL | 3 refills | Status: DC

## 2018-12-27 NOTE — Assessment & Plan Note (Signed)
Reviewed health habits including diet and exercise and skin cancer prevention Reviewed appropriate screening tests for age  Also reviewed health mt list, fam hx and immunization status , as well as social and family history   Reviewed amw  Labs rev See HPI Disc shingrix vaccine -recommend if covered  Pt will call in spring to schedule dexa (no falls or fx) Signed up for cologuard program (was neg in 2017)  Encouraged healthy diet and exercise  inst to d/c zinc supplement due to GI upset

## 2018-12-27 NOTE — Assessment & Plan Note (Signed)
Discussed how this problem influences overall health and the risks it imposes  Reviewed plan for weight loss with lower calorie diet (via better food choices and also portion control or program like weight watchers) and exercise building up to or more than 30 minutes 5 days per week including some aerobic activity    

## 2018-12-27 NOTE — Assessment & Plan Note (Signed)
Disc goals for lipids and reasons to control them Rev last labs with pt Rev low sat fat diet in detail  LDL is stable at 129 and HDL in 60s Declines medication  Urged to keep working on diet

## 2018-12-27 NOTE — Progress Notes (Signed)
Subjective:    Patient ID: Erika Evans, female    DOB: [birth year], 84 y.o.   MRN: [id number]  HPI Here for health maintenance exam and to review chronic medical problems    Doing ok overall  Recently some abdominal pain problems -both sides and up into lower chest  Comes and goes  Burping sometimes relieves it  She takes omeprazole 20 mg -still takes daily  Has occasional heartburn and has to add tums  No nsaids    Once she became nauseated with her stomach pain  She regurgitated capsule powder from her medicines  ? If pills are getting stuck in her esophagus  Also started zinc- ? If a side effect  Feels like food may get stuck part way down   Injured her R foot- pulled ligaments  That is getting better    Had amw on 10/15-no gaps Discussed shingrix   Wt Readings from Last 3 Encounters:  12/27/18 154 lb 1 oz (69.9 kg)  12/21/17 146 lb 12 oz (66.6 kg)  12/14/17 144 lb 12 oz (65.7 kg)  eats a lot of fruit More sweets  Does not overeat  No regular exercise  30.09 kg/m   Mammogram 8/20  Self breast exam - no lumps   dexa 12/18 -osteopenia Falls -none  Fractures-none Supplements- vit D 2000 iu daily  Exercise -none Intol to oral bisphosphonate and evista and miacalcin in the past   zostavax 10/15 Flu shot this mo May check on shingrix coverage   cologuard neg 11/17    BP Readings from Last 3 Encounters:  12/27/18 122/80  12/21/17 114/66  12/14/17 110/70   Pulse Readings from Last 3 Encounters:  12/27/18 78  12/21/17 72  12/14/17 70    Hyperlipidemia Lab Results  Component Value Date   CHOL 214 (H) 12/20/2018   CHOL 214 (H) 12/14/2017   CHOL 206 (H) 12/13/2016   Lab Results  Component Value Date   HDL 61.00 12/20/2018   HDL 67.90 12/14/2017   HDL 69.90 12/13/2016   Lab Results  Component Value Date   LDLCALC 129 (H) 12/20/2018   LDLCALC 132 (H) 12/14/2017   LDLCALC 124 (H) 12/13/2016   Lab Results  Component Value Date   TRIG  121.0 12/20/2018   TRIG 72.0 12/14/2017   TRIG 60.0 12/13/2016   Lab Results  Component Value Date   CHOLHDL 4 12/20/2018   CHOLHDL 3 12/14/2017   CHOLHDL 3 12/13/2016   Lab Results  Component Value Date   LDLDIRECT 123.5 11/27/2012   LDLDIRECT 146.1 11/25/2011   LDLDIRECT 144.2 04/21/2011   Has declined medication in the past  Diet is not as good- more sweets   Other labs Results for orders placed or performed in visit on 12/20/18  TSH  Result Value Ref Range   TSH 1.70 0.35 - 4.50 uIU/mL  Lipid panel  Result Value Ref Range   Cholesterol 214 (H) 0 - 200 mg/dL   Triglycerides 121.0 0.0 - 149.0 mg/dL   HDL 61.00 >39.00 mg/dL   VLDL 24.2 0.0 - 40.0 mg/dL   LDL Cholesterol 129 (H) 0 - 99 mg/dL   Total CHOL/HDL Ratio 4    NonHDL 153.08   Comprehensive metabolic panel  Result Value Ref Range   Sodium 141 135 - 145 mEq/L   Potassium 3.9 3.5 - 5.1 mEq/L   Chloride 100 96 - 112 mEq/L   CO2 33 (H) 19 - 32 mEq/L   Glucose, Bld 92 70 -  99 mg/dL   BUN 13 6 - 23 mg/dL   Creatinine, Ser 0.79 0.40 - 1.20 mg/dL   Total Bilirubin 0.6 0.2 - 1.2 mg/dL   Alkaline Phosphatase 85 39 - 117 U/L   AST 17 0 - 37 U/L   ALT 14 0 - 35 U/L   Total Protein 7.4 6.0 - 8.3 g/dL   Albumin 4.2 3.5 - 5.2 g/dL   Calcium 9.9 8.4 - 10.5 mg/dL   GFR 69.60 >60.00 mL/min  CBC with Differential/Platelet  Result Value Ref Range   WBC 8.4 4.0 - 10.5 K/uL   RBC 4.24 3.87 - 5.11 Mil/uL   Hemoglobin 12.9 12.0 - 15.0 g/dL   HCT 39.3 36.0 - 46.0 %   MCV 92.6 78.0 - 100.0 fl   MCHC 32.7 30.0 - 36.0 g/dL   RDW 13.5 11.5 - 15.5 %   Platelets 218.0 150.0 - 400.0 K/uL   Neutrophils Relative % 47.3 43.0 - 77.0 %   Lymphocytes Relative 41.0 12.0 - 46.0 %   Monocytes Relative 10.2 3.0 - 12.0 %   Eosinophils Relative 1.2 0.0 - 5.0 %   Basophils Relative 0.3 0.0 - 3.0 %   Neutro Abs 4.0 1.4 - 7.7 K/uL   Lymphs Abs 3.4 0.7 - 4.0 K/uL   Monocytes Absolute 0.9 0.1 - 1.0 K/uL   Eosinophils Absolute 0.1 0.0 - 0.7  K/uL   Basophils Absolute 0.0 0.0 - 0.1 K/uL    Patient Active Problem List   Diagnosis Date Noted  . Venous insufficiency of both lower extremities 12/21/2017  . Routine general medical examination at a health care facility 01/28/2015  . Screening for colon cancer 01/28/2015  . Estrogen deficiency 01/28/2015  . Encounter for Medicare annual wellness exam 11/26/2012  . GERD (gastroesophageal reflux disease) 04/21/2011  . OA (osteoarthritis) 04/21/2011  . Hyperlipidemia 04/21/2011  . Osteopenia 04/21/2011  . Obese 04/21/2011   Past Medical History:  Diagnosis Date  . Cataract 2019   resolved with surgery  . Elevated liver enzymes   . Foot pain    Left; nerve problem; procedure  . GERD (gastroesophageal reflux disease)   . Hepatitis B carrier ([place])    Positive hep B test at [facility]  . Hyperlipidemia    Mild  . Osteoarthritis   . Osteopenia   . Pneumonia 2004   Past Surgical History:  Procedure Laterality Date  . "Blocked kidney"    . BACK SURGERY    . CARDIOVASCULAR STRESS TEST  9/07   Neg  . CATARACT EXTRACTION W/ INTRAOCULAR LENS IMPLANT Right 05/06/2017   Dr. Tommy Rainwater  . CATARACT EXTRACTION W/ INTRAOCULAR LENS IMPLANT Left 05/20/2017   Dr. Tommy Rainwater  . COLONOSCOPY  9/06   Diverticulosis, hems  . Dexa     Osteopenis 9/01, improved 10/03, decreased BMD 5/06  . PARTIAL HYSTERECTOMY    . US ECHOCARDIOGRAPHY  7/03   Abd-normal  . US RENAL/AORTA  2007   Neg   Social History   Tobacco Use  . Smoking status: Never Smoker  . Smokeless tobacco: Never Used  Substance Use Topics  . Alcohol use: No    Alcohol/week: 0.0 standard drinks  . Drug use: No   Family History  Problem Relation Age of Onset  . Lung cancer Father   . Cancer Sister        Bladder  . Heart attack Brother   . Leukemia Brother   . Stroke Brother    Allergies  Allergen Reactions  .  Alendronate Sodium     REACTION: acid reflux  . Miacalcin [Calcitonin ([place])] Other (See Comments)    Pt  thinks the spray gave her a staph infection in her nose   . Raloxifene     REACTION: severe muscle pain and weakness   Current Outpatient Medications on File Prior to Visit  Medication Sig Dispense Refill  . Acetaminophen (TYLENOL ARTHRITIS PAIN PO) Take 2 tablets by mouth daily.    Marland Kitchen albuterol (PROVENTIL HFA;VENTOLIN HFA) 108 (90 BASE) MCG/ACT inhaler Inhale 2 puffs into the lungs every 4 (four) hours as needed for wheezing or shortness of breath. 1 Inhaler 3  . aspirin 81 MG tablet Take 81 mg by mouth daily.      . Bepotastine Besilate (BEPREVE) 1.5 % SOLN Place 1 drop into both eyes as needed.    . Cholecalciferol (VITAMIN D-3) 1000 UNITS CAPS Take 2 capsules by mouth daily.    . fish oil-omega-3 fatty acids 1000 MG capsule Take 1 g by mouth 2 (two) times daily.     . Fluticasone-Salmeterol (ADVAIR DISKUS) 250-50 MCG/DOSE AEPB Inhale 1 puff into the lungs every 12 (twelve) hours.     Marland Kitchen levocetirizine (XYZAL) 5 MG tablet Take 5 mg by mouth at bedtime.    . montelukast (SINGULAIR) 10 MG tablet Take 10 mg by mouth daily.      . Probiotic Product (PROBIOTIC PO) Take 1 tablet by mouth daily.     No current facility-administered medications on file prior to visit.     Review of Systems  Constitutional: Negative for activity change, appetite change, fatigue, fever and unexpected weight change.  HENT: Negative for congestion, ear pain, rhinorrhea, sinus pressure and sore throat.   Eyes: Negative for pain, redness and visual disturbance.  Respiratory: Negative for cough, shortness of breath and wheezing.   Cardiovascular: Negative for chest pain and palpitations.  Gastrointestinal: Positive for abdominal pain and nausea. Negative for abdominal distention, anal bleeding, blood in stool, constipation and diarrhea.       Acid reflux   Endocrine: Negative for polydipsia and polyuria.  Genitourinary: Negative for dysuria, frequency and urgency.  Musculoskeletal: Negative for arthralgias, back pain  and myalgias.       Joint stiffness  Skin: Negative for pallor and rash.  Allergic/Immunologic: Negative for environmental allergies.  Neurological: Negative for dizziness, syncope and headaches.  Hematological: Negative for adenopathy. Does not bruise/bleed easily.  Psychiatric/Behavioral: Negative for decreased concentration and dysphoric mood. The patient is not nervous/anxious.        Objective:   Physical Exam Constitutional:      General: She is not in acute distress.    Appearance: Normal appearance. She is well-developed. She is obese. She is not ill-appearing or diaphoretic.  HENT:     Head: Normocephalic and atraumatic.     Right Ear: Tympanic membrane, ear canal and external ear normal.     Left Ear: Tympanic membrane, ear canal and external ear normal.     Nose: Nose normal. No congestion.     Mouth/Throat:     Mouth: Mucous membranes are moist.     Pharynx: Oropharynx is clear. No posterior oropharyngeal erythema.  Eyes:     General: No scleral icterus.    Extraocular Movements: Extraocular movements intact.     Conjunctiva/sclera: Conjunctivae normal.     Pupils: Pupils are equal, round, and reactive to light.  Neck:     Musculoskeletal: Normal range of motion and neck supple. No neck rigidity or  muscular tenderness.     Thyroid: No thyromegaly.     Vascular: No carotid bruit or JVD.  Cardiovascular:     Rate and Rhythm: Normal rate and regular rhythm.     Pulses: Normal pulses.     Heart sounds: Normal heart sounds. No gallop.      Comments: Ankle varicosities -not tender  Pulmonary:     Effort: Pulmonary effort is normal. No respiratory distress.     Breath sounds: Normal breath sounds. No wheezing.     Comments: Good air exch Chest:     Chest wall: No tenderness.  Abdominal:     General: Bowel sounds are normal. There is no distension or abdominal bruit.     Palpations: Abdomen is soft. There is no mass.     Tenderness: There is no abdominal tenderness.  There is no guarding or rebound. Negative signs include Murphy's sign and McBurney's sign.     Hernia: No hernia is present.  Genitourinary:    Comments: Breast exam: No mass, nodules, thickening, tenderness, bulging, retraction, inflamation, nipple discharge or skin changes noted.  No axillary or clavicular LA.     Musculoskeletal: Normal range of motion.        General: No tenderness.     Right lower leg: No edema.     Left lower leg: No edema.  Lymphadenopathy:     Cervical: No cervical adenopathy.  Skin:    General: Skin is warm and dry.     Coloration: Skin is not pale.     Findings: No erythema or rash.     Comments: Some skin tags and SKs   Neurological:     Mental Status: She is alert. Mental status is at baseline.     Cranial Nerves: No cranial nerve deficit.     Motor: No abnormal muscle tone.     Coordination: Coordination normal.     Gait: Gait normal.     Deep Tendon Reflexes: Reflexes are normal and symmetric. Reflexes normal.  Psychiatric:        Mood and Affect: Mood normal.        Cognition and Memory: Cognition and memory normal.           Assessment & Plan:   Problem List Items Addressed This Visit      Digestive   GERD (gastroesophageal reflux disease)    Worse lately with some upper abdominal burning and feeling of pills /food getting stuck in esophagus  Will increase omeprazole 20 mg to bid  Also disc what to watch in diet If no improvement in 2 weeks she will call for a GI referral       Relevant Medications   Probiotic Product (PROBIOTIC PO)   omeprazole (PRILOSEC) 20 MG capsule     Musculoskeletal and Integument   Osteopenia    Due for next dexa in December-she would like to put off until spring and will call when she is ready to schedule that  Past intolerance to oral bisphosphonate/evista and also miacalcin spray  No falls or fx Continues vitamin D 2000 iu daily        Other   Hyperlipidemia    Disc goals for lipids and reasons to  control them Rev last labs with pt Rev low sat fat diet in detail  LDL is stable at 129 and HDL in 60s Declines medication  Urged to keep working on diet       Obese    Discussed how this  problem influences overall health and the risks it imposes  Reviewed plan for weight loss with lower calorie diet (via better food choices and also portion control or program like weight watchers) and exercise building up to or more than 30 minutes 5 days per week including some aerobic activity         Routine general medical examination at a health care facility - Primary    Reviewed health habits including diet and exercise and skin cancer prevention Reviewed appropriate screening tests for age  Also reviewed health mt list, fam hx and immunization status , as well as social and family history   Reviewed amw  Labs rev See HPI Disc shingrix vaccine -recommend if covered  Pt will call in spring to schedule dexa (no falls or fx) Signed up for cologuard program (was neg in 2017)  Encouraged healthy diet and exercise  inst to d/c zinc supplement due to GI upset      Screening for colon cancer    cologuard ordered  Neg 3 y ago

## 2018-12-27 NOTE — Patient Instructions (Addendum)
Stop you zinc  Continue the probiotic  Keep a food diary and see if certain foods make your stomach problems worse   Increase your omeprazole to twice daily  If this does not eliminate your stomach problems in 2 weeks let me know and I will set you up with a GI doctor   Try to add some daily exercise  Walking around the house or doing an exercise video would be great   Your bone density test can be planned any time December or later  Call us in the spring to set that up   Try to get 1200-1500 mg of calcium per day with at least 1000 iu of vitamin D - for bone health   I ordered the cologuard test for colon cancer screening

## 2018-12-27 NOTE — Assessment & Plan Note (Signed)
Due for next dexa in December-she would like to put off until spring and will call when she is ready to schedule that  Past intolerance to oral bisphosphonate/evista and also miacalcin spray  No falls or fx Continues vitamin D 2000 iu daily

## 2018-12-27 NOTE — Assessment & Plan Note (Signed)
cologuard ordered  Neg 3 y ago

## 2018-12-27 NOTE — Assessment & Plan Note (Signed)
Worse lately with some upper abdominal burning and feeling of pills /food getting stuck in esophagus  Will increase omeprazole 20 mg to bid  Also disc what to watch in diet If no improvement in 2 weeks she will call for a GI referral

## 2019-02-09 ENCOUNTER — Encounter: Payer: Self-pay | Admitting: Family Medicine

## 2019-02-14 LAB — COLOGUARD: Cologuard: POSITIVE — AB

## 2019-02-15 ENCOUNTER — Encounter: Payer: Self-pay | Admitting: Internal Medicine

## 2019-02-15 ENCOUNTER — Telehealth: Payer: Self-pay | Admitting: Family Medicine

## 2019-02-15 DIAGNOSIS — R195 Other fecal abnormalities: Secondary | ICD-10-CM | POA: Insufficient documentation

## 2019-02-15 NOTE — Telephone Encounter (Signed)
I haven't seen any results will route to PCP to see if she has them in her inbox

## 2019-02-15 NOTE — Telephone Encounter (Signed)
[employer] is the company who provides Cologuard

## 2019-02-15 NOTE — Telephone Encounter (Signed)
What labs or test is she referring to ?

## 2019-02-15 NOTE — Telephone Encounter (Signed)
Pt notified of Dr. Marliss Coots comments and agreed with GI referral for consultation regarding getting a colonoscopy. Please put referral in and pt advise or [facility] will call to schedule appt.

## 2019-02-15 NOTE — Telephone Encounter (Signed)
I have it  A positive cologuard test  See if she is open to colonoscopy /further evaluation  (given age would most likely consult first with GI)   No prior hx of colon polyps  Last colonoscopy was 06 Last cologuard 3 y ago was normal

## 2019-02-15 NOTE — Telephone Encounter (Signed)
Calling to make sure you received test results on pt  They were abnormal

## 2019-02-15 NOTE — Telephone Encounter (Signed)
Referral done Will route to pcc 

## 2019-03-23 ENCOUNTER — Ambulatory Visit: Payer: Medicare Other | Admitting: Internal Medicine

## 2019-03-23 ENCOUNTER — Encounter: Payer: Self-pay | Admitting: Internal Medicine

## 2019-03-23 VITALS — BP 124/72 | HR 83 | Temp 98.4°F | Ht 60.0 in | Wt 150.0 lb

## 2019-03-23 DIAGNOSIS — Z01818 Encounter for other preprocedural examination: Secondary | ICD-10-CM

## 2019-03-23 DIAGNOSIS — K625 Hemorrhage of anus and rectum: Secondary | ICD-10-CM | POA: Diagnosis not present

## 2019-03-23 DIAGNOSIS — R195 Other fecal abnormalities: Secondary | ICD-10-CM | POA: Diagnosis not present

## 2019-03-23 DIAGNOSIS — R1013 Epigastric pain: Secondary | ICD-10-CM

## 2019-03-23 NOTE — Patient Instructions (Signed)
You have been scheduled for an endoscopy and colonoscopy. Please follow the written instructions given to you at your visit today. Please pick up your prep supplies at the pharmacy within the next 1-3 days. If you use inhalers (even only as needed), please bring them with you on the day of your procedure.  I appreciate the opportunity to care for you. Carl Gessner, MD, FACG 

## 2019-03-23 NOTE — Progress Notes (Signed)
Erika Evans 85 y.o. 1934/03/30 [id number]  Assessment & Plan:   Encounter Diagnoses  Name Primary?  . Abdominal pain, epigastric Yes  . Positive colorectal cancer screening using Cologuard test   . Rectal bleeding     Evaluate w/ EGD and colonoscopy. The risks and benefits as well as alternatives of endoscopic procedure(s) have been discussed and reviewed. All questions answered. The patient agrees to proceed.   Subjective:   Chief Complaint: + cologuard  HPI + Cologuard test recent;y negative 3 yrs ago  No visible bleeding when did the Cologuard  Did have a large amount of red blood in the commode 3-4 wks ago, sometimes dark stools  Some variable abdominal pains but worse in epigastrium  May get nauseous at times  Few months sxs  Eating not a problem   Wt Readings from Last 3 Encounters:  03/23/19 150 lb (68 kg)  12/27/18 154 lb 1 oz (69.9 kg)  12/21/17 146 lb 12 oz (66.6 kg)    Colonoscopy 2006 diverticulosis and hemorrhoids  She does not want to get the Covid Vaccine now - "wait and see, I have asthma" Allergies  Allergen Reactions  . Alendronate Sodium     REACTION: acid reflux  . Miacalcin [Calcitonin ([place])] Other (See Comments)    Pt thinks the spray gave her a staph infection in her nose   . Raloxifene     REACTION: severe muscle pain and weakness   Current Meds  Medication Sig  . Acetaminophen (TYLENOL ARTHRITIS PAIN PO) Take 1 tablet by mouth daily.   Marland Kitchen albuterol (PROVENTIL HFA;VENTOLIN HFA) 108 (90 BASE) MCG/ACT inhaler Inhale 2 puffs into the lungs every 4 (four) hours as needed for wheezing or shortness of breath.  Marland Kitchen aspirin 81 MG tablet Take 81 mg by mouth daily.    . Bepotastine Besilate (BEPREVE) 1.5 % SOLN Place 1 drop into both eyes as needed.  . Cholecalciferol (VITAMIN D-3) 1000 UNITS CAPS Take 2 capsules by mouth daily.  . fish oil-omega-3 fatty acids 1000 MG capsule Take 1 g by mouth 2 (two) times daily.   .  Fluticasone-Salmeterol (ADVAIR DISKUS) 250-50 MCG/DOSE AEPB Inhale 1 puff into the lungs every 12 (twelve) hours.   Marland Kitchen glucosamine-chondroitin 500-400 MG tablet Take 1 tablet by mouth daily.  Marland Kitchen levocetirizine (XYZAL) 5 MG tablet Take 5 mg by mouth at bedtime.  . montelukast (SINGULAIR) 10 MG tablet Take 10 mg by mouth daily.    Marland Kitchen omeprazole (PRILOSEC) 20 MG capsule Take 1 capsule (20 mg total) by mouth 2 (two) times daily before a meal.  . Probiotic Product (PROBIOTIC PO) Take 1 tablet by mouth daily.   Past Medical History:  Diagnosis Date  . Cataract 2019   resolved with surgery  . Elevated liver enzymes   . Foot pain    Left; nerve problem; procedure  . GERD (gastroesophageal reflux disease)   . Hepatitis B carrier ([place])    Positive hep B test at [facility]  . Hyperlipidemia    Mild  . Osteoarthritis   . Osteopenia   . Pneumonia 2004   Past Surgical History:  Procedure Laterality Date  . "Blocked kidney"    . BACK SURGERY    . CARDIOVASCULAR STRESS TEST  9/07   Neg  . CATARACT EXTRACTION W/ INTRAOCULAR LENS IMPLANT Right 05/06/2017   Dr. Tommy Rainwater  . CATARACT EXTRACTION W/ INTRAOCULAR LENS IMPLANT Left 05/20/2017   Dr. Tommy Rainwater  . COLONOSCOPY  9/06   Diverticulosis,  hems  . Dexa     Osteopenis 9/01, improved 10/03, decreased BMD 5/06  . PARTIAL HYSTERECTOMY    . US ECHOCARDIOGRAPHY  7/03   Abd-normal  . US RENAL/AORTA  2007   Neg   Social History   Social History Narrative   Lost husband November 2004.   Sometimes walks for exercise.   Planning trip to [place] 7/10.   family history includes Cancer in her sister; Heart attack in her brother; Leukemia in her brother; Lung cancer in her father; Stroke in her brother.   Review of Systems As per HPI Joint pains, some itching ? Dry skin, urine leakage, sl right foot swelling after injuru, some asthma sob at times O/w neg Objective:   Physical Exam @BP  124/72   Pulse 83   Temp 98.4 F (36.9 C) (Oral)   Ht 5'  (1.524 m)   Wt 150 lb (68 kg)   BMI 29.29 kg/m @  General:  Well-developed, well-nourished and in no acute distress - elderly ww Eyes:  anicteric. Lungs: Clear to auscultation bilaterally. Heart:  S1S2, no rubs, murmurs, gallops. Abdomen:  soft, non-tender, no hepatosplenomegaly, hernia, or mass and BS+.  Rectal: Deferred until colonoscopy Neuro:  A&O x 3.  Psych:  appropriate mood and  Affect.   Data Reviewed:  PCP notes See HPI  CBC Latest Ref Rng & Units 12/20/2018 12/14/2017 12/13/2016  WBC 4.0 - 10.5 K/uL 8.4 7.6 6.7  Hemoglobin 12.0 - 15.0 g/dL 12.9 13.0 12.9  Hematocrit 36.0 - 46.0 % 39.3 39.4 39.3  Platelets 150.0 - 400.0 K/uL 218.0 212.0 202.0

## 2019-04-02 ENCOUNTER — Other Ambulatory Visit: Payer: Self-pay | Admitting: Internal Medicine

## 2019-04-02 ENCOUNTER — Ambulatory Visit (INDEPENDENT_AMBULATORY_CARE_PROVIDER_SITE_OTHER): Payer: Medicare Other

## 2019-04-02 DIAGNOSIS — Z1159 Encounter for screening for other viral diseases: Secondary | ICD-10-CM

## 2019-04-03 LAB — SARS CORONAVIRUS 2 (TAT 6-24 HRS): SARS Coronavirus 2: NEGATIVE

## 2019-04-04 ENCOUNTER — Other Ambulatory Visit: Payer: Self-pay

## 2019-04-04 ENCOUNTER — Encounter: Payer: Self-pay | Admitting: Internal Medicine

## 2019-04-04 ENCOUNTER — Ambulatory Visit (AMBULATORY_SURGERY_CENTER): Payer: Medicare Other | Admitting: Internal Medicine

## 2019-04-04 VITALS — BP 125/69 | HR 79 | Temp 96.8°F | Resp 15 | Ht 60.0 in | Wt 150.0 lb

## 2019-04-04 DIAGNOSIS — K5730 Diverticulosis of large intestine without perforation or abscess without bleeding: Secondary | ICD-10-CM | POA: Diagnosis not present

## 2019-04-04 DIAGNOSIS — R195 Other fecal abnormalities: Secondary | ICD-10-CM

## 2019-04-04 DIAGNOSIS — R1013 Epigastric pain: Secondary | ICD-10-CM | POA: Diagnosis not present

## 2019-04-04 DIAGNOSIS — K625 Hemorrhage of anus and rectum: Secondary | ICD-10-CM | POA: Diagnosis not present

## 2019-04-04 DIAGNOSIS — D122 Benign neoplasm of ascending colon: Secondary | ICD-10-CM | POA: Diagnosis not present

## 2019-04-04 HISTORY — PX: UPPER GASTROINTESTINAL ENDOSCOPY: SHX188

## 2019-04-04 HISTORY — PX: COLONOSCOPY: SHX174

## 2019-04-04 MED ORDER — SODIUM CHLORIDE 0.9 % IV SOLN: 500.0000 mL | Freq: Once | INTRAVENOUS | Status: DC

## 2019-04-04 NOTE — Op Note (Signed)
[place] Patient Name: Erika Evans Procedure Date: 04/04/2019 3:02 PM MRN: [id number] Endoscopist: Gatha Mayer , MD Age: 85 Referring MD:  Date of Birth: [birth year] Gender: Female Account #: [id number] Procedure:                Colonoscopy Indications:              Positive Cologuard test, Rectal bleeding Medicines:                Propofol per Anesthesia, Monitored Anesthesia Care Procedure:                Pre-Anesthesia Assessment:                           - Prior to the procedure, a History and Physical                            was performed, and patient medications and                            allergies were reviewed. The patient's tolerance of                            previous anesthesia was also reviewed. The risks                            and benefits of the procedure and the sedation                            options and risks were discussed with the patient.                            All questions were answered, and informed consent                            was obtained. Prior Anticoagulants: The patient has                            taken no previous anticoagulant or antiplatelet                            agents. ASA Grade Assessment: III - A patient with                            severe systemic disease. After reviewing the risks                            and benefits, the patient was deemed in                            satisfactory condition to undergo the procedure.                           After obtaining informed consent, the colonoscope  was passed under direct vision. Throughout the                            procedure, the patient's blood pressure, pulse, and                            oxygen saturations were monitored continuously. The                            Colonoscope was introduced through the anus and                            advanced to the the cecum, identified by   appendiceal orifice and ileocecal valve. The                            colonoscopy was performed without difficulty. The                            patient tolerated the procedure well. The quality                            of the bowel preparation was excellent. The bowel                            preparation used was Miralax via split dose                            instruction. The ileocecal valve, appendiceal                            orifice, and rectum were photographed. Scope In: 3:17:42 PM Scope Out: 3:29:30 PM Scope Withdrawal Time: 0 hours 9 minutes 19 seconds  Total Procedure Duration: 0 hours 11 minutes 48 seconds  Findings:                 The perianal and digital rectal examinations were                            normal.                           A 1 to 2 mm polyp was found in the ascending colon.                            The polyp was sessile. The polyp was removed with a                            cold biopsy forceps. Resection and retrieval were                            complete. Verification of patient identification                            for the  specimen was done. Estimated blood loss was                            minimal.                           Multiple diverticula were found in the sigmoid                            colon and descending colon.                           Internal hemorrhoids were found.                           The exam was otherwise without abnormality on                            direct and retroflexion views. Complications:            No immediate complications. Estimated Blood Loss:     Estimated blood loss was minimal. Impression:               - One 1 to 2 mm polyp in the ascending colon,                            removed with a cold biopsy forceps. Resected and                            retrieved.                           - Diverticulosis in the sigmoid colon and in the                            descending colon.                            - Internal hemorrhoids.                           - The examination was otherwise normal on direct                            and retroflexion views. Recommendation:           - Patient has a contact number available for                            emergencies. The signs and symptoms of potential                            delayed complications were discussed with the                            patient. Return to normal activities tomorrow.  Written discharge instructions were provided to the                            patient.                           - Resume previous diet.                           - Continue present medications.                           - No repeat colonoscopy due to age. No Cologuard                            either                           - Would observe fore a few weeks - if abdominal                            pain persists - would then consider additional                            evaluation such as [place] or CT scanning                           She has not had weight loss or other constitutional                            issues that I am aware of. Gatha Mayer, MD 04/04/2019 3:49:09 PM This report has been signed electronically.

## 2019-04-04 NOTE — Progress Notes (Signed)
Report to PACU, RN, vss, BBS= Clear.  

## 2019-04-04 NOTE — Progress Notes (Signed)
Called to room to assist during endoscopic procedure.  Patient ID and intended procedure confirmed with present staff. Received instructions for my participation in the procedure from the performing physician.  

## 2019-04-04 NOTE — Progress Notes (Signed)
Temp JB Vitals KA

## 2019-04-04 NOTE — Op Note (Signed)
[facility] Patient Name: Jacquelyn Engebretson Procedure Date: 04/04/2019 3:02 PM MRN: [id number] Endoscopist: Gatha Mayer , MD Age: 85 Referring MD:  Date of Birth: 1935/02/04 Gender: Female Account #: [id number] Procedure:                Upper GI endoscopy Indications:              Epigastric abdominal pain Medicines:                Propofol per Anesthesia, Monitored Anesthesia Care Procedure:                Pre-Anesthesia Assessment:                           - Prior to the procedure, a History and Physical                            was performed, and patient medications and                            allergies were reviewed. The patient's tolerance of                            previous anesthesia was also reviewed. The risks                            and benefits of the procedure and the sedation                            options and risks were discussed with the patient.                            All questions were answered, and informed consent                            was obtained. Prior Anticoagulants: The patient has                            taken no previous anticoagulant or antiplatelet                            agents. ASA Grade Assessment: III - A patient with                            severe systemic disease. After reviewing the risks                            and benefits, the patient was deemed in                            satisfactory condition to undergo the procedure.                           After obtaining informed consent, the endoscope was  passed under direct vision. Throughout the                            procedure, the patient's blood pressure, pulse, and                            oxygen saturations were monitored continuously. The                            Endoscope was introduced through the mouth, and                            advanced to the second part of duodenum. The upper   GI endoscopy was accomplished without difficulty.                            The patient tolerated the procedure well. Scope In: Scope Out: Findings:                 The esophagus was normal.                           The stomach was normal.                           The examined duodenum was normal.                           The cardia and gastric fundus were normal on                            retroflexion. Complications:            No immediate complications. Estimated Blood Loss:     Estimated blood loss: none. Impression:               - Normal esophagus.                           - Normal stomach.                           - Normal examined duodenum.                           - No specimens collected. Recommendation:           - Patient has a contact number available for                            emergencies. The signs and symptoms of potential                            delayed complications were discussed with the                            patient. Return to normal activities tomorrow.  Written discharge instructions were provided to the                            patient.                           - Resume previous diet.                           - Continue present medications.                           - See the other procedure note for documentation of                            additional recommendations. Gatha Mayer, MD 04/04/2019 3:44:33 PM This report has been signed electronically.

## 2019-04-04 NOTE — Patient Instructions (Addendum)
The esophagus, stomach and upper intestine look ok.  There was a tiny colon polyp - removed. Not to worry - sure it is benign and no follow-up needed. You do have diverticulosis - thickened muscle rings and pouches in the colon wall. Please read the handout about this condition.  Hemorrhoids also seen and I bet these are what bled and the reason for + Cologuard test.  I suggest we give it a few weeks - if stomach not doing better than call me back and will see what else we might need to check (testing, that is)  You do not need any more Cologuard tests going forward.  I appreciate the opportunity to care for you. Gatha Mayer, MD, FACG    YOU HAD AN ENDOSCOPIC PROCEDURE TODAY AT [facility]:   Refer to the procedure report that was given to you for any specific questions about what was found during the examination.  If the procedure report does not answer your questions, please call your gastroenterologist to clarify.  If you requested that your care partner not be given the details of your procedure findings, then the procedure report has been included in a sealed envelope for you to review at your convenience later.  YOU SHOULD EXPECT: Some feelings of bloating in the abdomen. Passage of more gas than usual.  Walking can help get rid of the air that was put into your GI tract during the procedure and reduce the bloating. If you had a lower endoscopy (such as a colonoscopy or flexible sigmoidoscopy) you may notice spotting of blood in your stool or on the toilet paper. If you underwent a bowel prep for your procedure, you may not have a normal bowel movement for a few days.  Please Note:  You might notice some irritation and congestion in your nose or some drainage.  This is from the oxygen used during your procedure.  There is no need for concern and it should clear up in a day or so.  SYMPTOMS TO REPORT IMMEDIATELY:   Following lower endoscopy (colonoscopy or flexible  sigmoidoscopy):  Excessive amounts of blood in the stool  Significant tenderness or worsening of abdominal pains  Swelling of the abdomen that is new, acute  Fever of 100F or higher   Following upper endoscopy (EGD)  Vomiting of blood or coffee ground material  New chest pain or pain under the shoulder blades  Painful or persistently difficult swallowing  New shortness of breath  Fever of 100F or higher  Black, tarry-looking stools  For urgent or emergent issues, a gastroenterologist can be reached at any hour by calling [phone].   DIET:  We do recommend a small meal at first, but then you may proceed to your regular diet.  Drink plenty of fluids but you should avoid alcoholic beverages for 24 hours.  ACTIVITY:  You should plan to take it easy for the rest of today and you should NOT DRIVE or use heavy machinery until tomorrow (because of the sedation medicines used during the test).    FOLLOW UP: Our staff will call the number listed on your records 48-72 hours following your procedure to check on you and address any questions or concerns that you may have regarding the information given to you following your procedure. If we do not reach you, we will leave a message.  We will attempt to reach you two times.  During this call, we will ask if you have developed any  symptoms of COVID 19. If you develop any symptoms (ie: fever, flu-like symptoms, shortness of breath, cough etc.) before then, please call [phone].  If you test positive for Covid 19 in the 2 weeks post procedure, please call and report this information to [place].    If any biopsies were taken you will be contacted by phone or by letter within the next 1-3 weeks.  Please call us at [phone] if you have not heard about the biopsies in 3 weeks.    SIGNATURES/CONFIDENTIALITY: You and/or your care partner have signed paperwork which will be entered into your electronic medical record.  These signatures attest to  the fact that that the information above on your After Visit Summary has been reviewed and is understood.  Full responsibility of the confidentiality of this discharge information lies with you and/or your care-partner.   Stop probotic,resume remainder of medications. Information given on polyps,diverticulosis and hemorrhoids.

## 2019-04-04 NOTE — Progress Notes (Signed)
Dr. Carlean Purl verbally instructed pt. To stop probotic at bedside.

## 2019-04-06 ENCOUNTER — Telehealth: Payer: Self-pay

## 2019-04-06 NOTE — Telephone Encounter (Signed)
  Follow up Call-  Call back number 04/04/2019  Post procedure Call Back phone  # [phone]  Permission to leave phone message Yes  Some recent data might be hidden     Patient questions:  Do you have a fever, pain , or abdominal swelling? No. Pain Score  0 *  Have you tolerated food without any problems? Yes.    Have you been able to return to your normal activities? Yes.    Do you have any questions about your discharge instructions: Diet   No. Medications  No. Follow up visit  No.  Do you have questions or concerns about your Care? No.  Actions: * If pain score is 4 or above: No action needed, pain <4. 1. Have you developed a fever since your procedure? no  2.   Have you had an respiratory symptoms (SOB or cough) since your procedure? no  3.   Have you tested positive for COVID 19 since your procedure no  4.   Have you had any family members/close contacts diagnosed with the COVID 19 since your procedure?  no   If yes to any of these questions please route to Joylene John, RN and Alphonsa Gin, Therapist, sports.

## 2019-04-13 ENCOUNTER — Encounter: Payer: Self-pay | Admitting: Internal Medicine

## 2019-08-08 ENCOUNTER — Telehealth: Payer: Self-pay

## 2019-08-08 NOTE — Telephone Encounter (Signed)
Thanks- I agree with advisement for UC for eval and also covid testing  I will watch for correspondence

## 2019-08-08 NOTE — Telephone Encounter (Signed)
Pt said she has been sick for 1 wk. Pt feeling weak,pt sounds congested,had H/A pain level is 0 now;lightheaded, no vomiting but pt has been nauseated and does not want anything to eat. Pt had watery diarrhea yesterday; has not had BM today. pts mouth is dry, lower abd pain, pt having CP on and off. Pt last urinated few minutes ago and seemed to be about normal amt of urine and pt did not notice color of urine. No fever in last couple of days. Last wk temp was 101. Pt is feeling fatigued.Pt has dry cough but no SOB. Pt has lost appetite. No other covid symptoms other than listed above. No travel and no known exposure to covid. Pt has not had covid vaccines. Pt wonders if she should be covid tested. Pt is going now to [facility] for evaluation; ([facility] in [place] does not have xray equipment setup yet).

## 2019-08-20 ENCOUNTER — Other Ambulatory Visit: Payer: Self-pay

## 2019-08-20 ENCOUNTER — Emergency Department
Admission: EM | Admit: 2019-08-20 | Discharge: 2019-08-21 | Disposition: A | Discharge status: Home / Self Care | Payer: Medicare Other | Attending: Emergency Medicine | Admitting: Emergency Medicine

## 2019-08-20 DIAGNOSIS — E876 Hypokalemia: Secondary | ICD-10-CM | POA: Diagnosis not present

## 2019-08-20 DIAGNOSIS — K5730 Diverticulosis of large intestine without perforation or abscess without bleeding: Secondary | ICD-10-CM | POA: Insufficient documentation

## 2019-08-20 DIAGNOSIS — K625 Hemorrhage of anus and rectum: Secondary | ICD-10-CM | POA: Diagnosis present

## 2019-08-20 DIAGNOSIS — R197 Diarrhea, unspecified: Secondary | ICD-10-CM | POA: Diagnosis not present

## 2019-08-20 LAB — BASIC METABOLIC PANEL
Anion gap: 11 (ref 5–15)
BUN: 9 mg/dL (ref 8–23)
CO2: 28 mmol/L (ref 22–32)
Calcium: 9.2 mg/dL (ref 8.9–10.3)
Chloride: 104 mmol/L (ref 98–111)
Creatinine, Ser: 0.68 mg/dL (ref 0.44–1.00)
GFR calc Af Amer: >60 mL/min (ref >60)
GFR calc non Af Amer: >60 mL/min (ref >60)
Glucose, Bld: 100 mg/dL — ABNORMAL HIGH (ref 70–99)
Potassium: 2.9 mmol/L — ABNORMAL LOW (ref 3.5–5.1)
Sodium: 143 mmol/L (ref 135–145)

## 2019-08-20 LAB — CBC
HCT: 39.4 % (ref 36.0–46.0)
Hemoglobin: 12.9 g/dL (ref 12.0–15.0)
MCH: 30.2 pg (ref 26.0–34.0)
MCHC: 32.7 g/dL (ref 30.0–36.0)
MCV: 92.3 fL (ref 80.0–100.0)
Platelets: 258 10*3/uL (ref 150–400)
RBC: 4.27 MIL/uL (ref 3.87–5.11)
RDW: 13.3 % (ref 11.5–15.5)
WBC: 10.3 10*3/uL (ref 4.0–10.5)
nRBC: 0 % (ref 0.0–0.2)

## 2019-08-20 LAB — TYPE AND SCREEN
ABO/RH(D): A NEG
Antibody Screen: NEGATIVE

## 2019-08-20 MED ORDER — POTASSIUM CHLORIDE 10 MEQ/100ML IV SOLN
10.0000 meq | Freq: Once | INTRAVENOUS | Status: AC
  Administered 2019-08-21: 10 meq via INTRAVENOUS
  Filled 2019-08-20: qty 100

## 2019-08-20 MED ORDER — LACTATED RINGERS IV BOLUS
1000.0000 mL | Freq: Once | INTRAVENOUS | Status: AC
  Administered 2019-08-20: 1000 mL via INTRAVENOUS

## 2019-08-20 MED ORDER — MAGNESIUM SULFATE 2 GM/50ML IV SOLN
2.0000 g | Freq: Once | INTRAVENOUS | Status: AC
  Administered 2019-08-20: 2 g via INTRAVENOUS
  Filled 2019-08-20: qty 50

## 2019-08-20 MED ORDER — POTASSIUM CHLORIDE CRYS ER 20 MEQ PO TBCR
40.0000 meq | EXTENDED_RELEASE_TABLET | Freq: Once | ORAL | Status: AC
  Administered 2019-08-20: 40 meq via ORAL
  Filled 2019-08-20: qty 2

## 2019-08-20 NOTE — ED Notes (Signed)
Pt states having lower abdominal pain and that she has had blood in her stool but that today around noon was the first bm that didn't have blood in it.

## 2019-08-20 NOTE — ED Provider Notes (Signed)
[facility] Emergency Department Provider Note  ____________________________________________  Time seen: Approximately 12:07 AM  I have reviewed the triage vital signs and the nursing notes.   HISTORY  Chief Complaint Rectal bleeding   HPI Erika Evans is a 85 y.o. female with a history of diverticulosis, hyperlipidemia, GERD, osteopenia who presents from home for bloody stools.  Patient reports that she has been having bloody stools and melena intermittently for the last 2 months.  Underwent a endoscopy and colonoscopy which only showed diverticulosis but no evidence of bleeding.  Over the last few days she started having them again.  Called her PCP who recommended her come to the emergency room.  She reports that she has been having 2-3 episodes daily of diarrhea that can have blood or melena in it for the last several days.  She said that at 12 PM today she had one bowel movement that looked brown and normal with no blood.  She has had some nausea but no vomiting.  No fever or chills.  She is not on blood thinners.  No unintentional weight loss.  No personal or family history of colon cancer.   Past Medical History:  Diagnosis Date   Cataract 2019   resolved with surgery   Congenital fusion of cervical spine    Elevated liver enzymes    Foot pain    Left; nerve problem; procedure   GERD (gastroesophageal reflux disease)    Hepatitis B carrier (HCC)    Positive hep B test at [facility]   Hyperlipidemia    Mild   Osteoarthritis    Osteopenia    Pneumonia 2004    Patient Active Problem List   Diagnosis Date Noted   Positive fecal immunochemical test 02/15/2019   Venous insufficiency of both lower extremities 12/21/2017   Routine general medical examination at a health care facility 01/28/2015   Screening for colon cancer 01/28/2015   Estrogen deficiency 01/28/2015   Encounter for Medicare annual wellness exam 11/26/2012   GERD  (gastroesophageal reflux disease) 04/21/2011   OA (osteoarthritis) 04/21/2011   Hyperlipidemia 04/21/2011   Osteopenia 04/21/2011   Obese 04/21/2011    Past Surgical History:  Procedure Laterality Date   "Blocked kidney"     BACK SURGERY     CARDIOVASCULAR STRESS TEST  9/07   Neg   CATARACT EXTRACTION W/ INTRAOCULAR LENS IMPLANT Right 05/06/2017   Dr. Tommy Rainwater   CATARACT EXTRACTION W/ INTRAOCULAR LENS IMPLANT Left 05/20/2017   Dr. Tommy Rainwater   COLONOSCOPY  9/06   Diverticulosis, hems   COLONOSCOPY  04/04/2019   Dexa     Osteopenis 9/01, improved 10/03, decreased BMD 5/06   PARTIAL HYSTERECTOMY     UPPER GASTROINTESTINAL ENDOSCOPY  04/04/2019   US ECHOCARDIOGRAPHY  7/03   Abd-normal   US RENAL/AORTA  2007   Neg    Prior to Admission medications   Medication Sig Start Date End Date Taking? Authorizing Provider  Acetaminophen (TYLENOL ARTHRITIS PAIN PO) Take 1 tablet by mouth daily.     [provider]  albuterol (PROVENTIL HFA;VENTOLIN HFA) 108 (90 BASE) MCG/ACT inhaler Inhale 2 puffs into the lungs every 4 (four) hours as needed for wheezing or shortness of breath. 01/28/15   Tower, Wynelle Fanny, MD  aspirin 81 MG tablet Take 81 mg by mouth daily.      [provider]  Bepotastine Besilate (BEPREVE) 1.5 % SOLN Place 1 drop into both eyes as needed.    [provider]  Cholecalciferol (VITAMIN D-3) 1000 UNITS CAPS Take 2 capsules by mouth daily.    [provider]  fish oil-omega-3 fatty acids 1000 MG capsule Take 1 g by mouth 2 (two) times daily.     [provider]  Fluticasone-Salmeterol (ADVAIR DISKUS) 250-50 MCG/DOSE AEPB Inhale 1 puff into the lungs every 12 (twelve) hours.     [provider]  glucosamine-chondroitin 500-400 MG tablet Take 1 tablet by mouth daily.    [provider]  levocetirizine (XYZAL) 5 MG tablet Take 5 mg by mouth at bedtime.    [provider]  montelukast (SINGULAIR) 10  MG tablet Take 10 mg by mouth daily.      [provider]  omeprazole (PRILOSEC) 20 MG capsule Take 1 capsule (20 mg total) by mouth 2 (two) times daily before a meal. 12/27/18   Tower, Wynelle Fanny, MD  Probiotic Product (PROBIOTIC PO) Take 1 tablet by mouth daily.    [provider]    Allergies Alendronate sodium, Miacalcin [calcitonin (salmon)], and Raloxifene  Family History  Problem Relation Age of Onset   Lung cancer Father    Cancer Sister        Bladder   Heart attack Brother    Leukemia Brother    Stroke Brother    Colon cancer Neg Hx    Colon polyps Neg Hx    Esophageal cancer Neg Hx    Rectal cancer Neg Hx    Stomach cancer Neg Hx     Social History Social History   Tobacco Use   Smoking status: Never Smoker   Smokeless tobacco: Never Used  Scientific laboratory technician Use: Never used  Substance Use Topics   Alcohol use: No    Alcohol/week: 0.0 standard drinks   Drug use: No    Review of Systems  Constitutional: Negative for fever. Eyes: Negative for visual changes. ENT: Negative for sore throat. Neck: No neck pain  Cardiovascular: Negative for chest pain. Respiratory: Negative for shortness of breath. Gastrointestinal: Negative for abdominal pain, vomiting. + diarrhea, melena Genitourinary: Negative for dysuria. Musculoskeletal: Negative for back pain. Skin: Negative for rash. Neurological: Negative for headaches, weakness or numbness. Psych: No SI or HI  ____________________________________________   PHYSICAL EXAM:  VITAL SIGNS: ED Triage Vitals  Enc Vitals Group     BP 08/20/19 1845 (!) 154/86     Pulse Rate 08/20/19 1845 86     Resp 08/20/19 1845 18     Temp 08/20/19 1845 98.7 F (37.1 C)     Temp Source 08/20/19 1845 Oral     SpO2 08/20/19 1845 96 %     Weight 08/20/19 1850 150 lb (68 kg)     Height 08/20/19 1850 5\' 2"  (1.575 m)     Head Circumference --      Peak Flow --      Pain Score 08/20/19 1853 0      Pain Loc --      Pain Edu? --      Excl. in [place]? --     Constitutional: Alert and oriented. Well appearing and in no apparent distress. HEENT:      Head: Normocephalic and atraumatic.         Eyes: Conjunctivae are normal. Sclera is non-icteric.       Mouth/Throat: Mucous membranes are moist.       Neck: Supple with no signs of meningismus. Cardiovascular: Irregularly irregular rhythm with normal rate. Respiratory: Normal  respiratory effort. Lungs are clear to auscultation bilaterally. No wheezes, crackles, or rhonchi.  Gastrointestinal: Soft, non tender, and non distended with positive bowel sounds. No rebound or guarding. Genitourinary: Rectal exam showing brown stool guaiac negative Musculoskeletal:  No edema, cyanosis, or erythema of extremities. Neurologic: Normal speech and language. Face is symmetric. Moving all extremities. No gross focal neurologic deficits are appreciated. Skin: Skin is warm, dry and intact. No rash noted. Psychiatric: Mood and affect are normal. Speech and behavior are normal.  ____________________________________________   LABS (all labs ordered are listed, but only abnormal results are displayed)  Labs Reviewed  BASIC METABOLIC PANEL - Abnormal; Notable for the following components:      Result Value   Potassium 2.9 (*)    Glucose, Bld 100 (*)    All other components within normal limits  CBC  MAGNESIUM  TYPE AND SCREEN   ____________________________________________  EKG  ED ECG REPORT I, Rudene Re, the attending physician, personally viewed and interpreted this ECG.  Normal sinus rhythm with frequent PACs, rate of 72, normal intervals, no ST elevations or depressions.  Ectopy is new when compared to prior.   01:45AM -normal sinus rhythm, rate of 85, occasional PACs, no ST elevations or depressions.  Otherwise normal EKG. ____________________________________________  RADIOLOGY  none   ____________________________________________   PROCEDURES  Procedure(s) performed:yes .1-3 Lead EKG Interpretation Performed by: Rudene Re, MD Authorized by: Rudene Re, MD     Interpretation: non-specific     ECG rate assessment: normal     Rhythm: sinus rhythm     Ectopy: PAC     Critical Care performed:  None ____________________________________________   INITIAL IMPRESSION / ASSESSMENT AND PLAN / ED COURSE   85 y.o. female with a history of diverticulosis, hyperlipidemia, GERD, osteopenia who presents from home for bloody stools.  Patient has been having intermittent episodes of melena and hematochezia for the last several months.  Her PCP sent her for an endoscopy and colonoscopy in January for these problems where she was found to have internal hemorrhoids and diverticulosis.  Over the last several days she started having again melena and hematochezia.  Called her doctor who sent her here for evaluation.  Patient reports that her last bowel movement was at 12 PM and it looked normal with no blood or melena.  She is not on blood thinners.  She denies any dizziness, she is hemodynamically stable, rectal exam here showed brown stool guaiac negative.  Her rhythm strip in the room looked very irregular therefore an EKG was done which shows frequent PACs but no A. fib.  Her labs are consistent with hypokalemia with a K of 2.9.  We will check her magnesium level.  Will replenish K and mag which could be the cause of her ectopy.  She is asymptomatic from it with no chest pain.  Hemoglobin is stable at 12.9 with no indication for transfusion.  Will repeat EKG after electrolytes are replenished.  Anticipate discharge home with follow-up with her PCP since patient has no anemia, hemodynamically stable, and no signs of active bleeding.  Bleeding most likely from her diverticular disease.  Old medical records reviewed.  _________________________ 2:18 AM on  08/21/2019 -----------------------------------------  After K and mag were replenished repeat EKG showing significant improvement of the PACs.  Patient remains well-appearing with no further episodes of melena or hematochezia.  Stable for discharge home with follow-up with her PCP.  Will not put her on potassium supplementation as part of it has  to do with the diarrhea which has now resolved.  Discussed my standard return precautions and close follow-up.    _____________________________________________ Please note:  Patient was evaluated in Emergency Department today for the symptoms described in the history of present illness. Patient was evaluated in the context of the global COVID-19 pandemic, which necessitated consideration that the patient might be at risk for infection with the SARS-CoV-2 virus that causes COVID-19. Institutional protocols and algorithms that pertain to the evaluation of patients at risk for COVID-19 are in a state of rapid change based on information released by regulatory bodies including the CDC and federal and state organizations. These policies and algorithms were followed during the patient's care in the ED.  Some ED evaluations and interventions may be delayed as a result of limited staffing during the pandemic.   [place] Controlled Substance Database was reviewed by me. ____________________________________________   FINAL CLINICAL IMPRESSION(S) / ED DIAGNOSES   Final diagnoses:  Diarrhea, unspecified type  Hypokalemia      NEW MEDICATIONS STARTED DURING THIS VISIT:  ED Discharge Orders    None       Note:  This document was prepared using Dragon voice recognition software and may include unintentional dictation errors.    Alfred Levins, [place], MD 08/21/19 [phone]

## 2019-08-20 NOTE — ED Notes (Signed)
Brought in by [facility] for abdominal pain and black stools greater than 1 month. Pt in NAD.

## 2019-08-20 NOTE — ED Triage Notes (Addendum)
Pt comes via POV from home with c/o rectal bleeding that started about first of this month and has gotten worse.   Pt states she thinks it has been going on for awhile but has since gotten worse this last month.  Pt states today it has cleared up but she still needed to get checked out. Pt states little belly pain.  Pt states it was bright red today  Pt denies any N/V.  Pt states she takes baby aspirin.

## 2019-08-21 LAB — MAGNESIUM: Magnesium: 1.8 mg/dL (ref 1.7–2.4)

## 2019-08-21 NOTE — Discharge Instructions (Addendum)
Return to the hospital if you have black stools or bloody stools, abdominal pain, chest pain, shortness of breath, or if you pass out.  Otherwise follow-up with your doctor in 2 days.

## 2019-08-21 NOTE — ED Notes (Signed)
Pt ambulated to bathroom with this RN's assistance. Pt tolerated well, back in bed at this time and comfortable. Will continue to monitor.

## 2019-08-22 ENCOUNTER — Encounter: Payer: Self-pay | Admitting: Family Medicine

## 2019-08-22 ENCOUNTER — Other Ambulatory Visit: Payer: Self-pay

## 2019-08-22 ENCOUNTER — Ambulatory Visit: Payer: Medicare Other | Admitting: Family Medicine

## 2019-08-22 VITALS — BP 126/82 | HR 92 | Temp 97.0°F | Ht 60.0 in | Wt 153.4 lb

## 2019-08-22 DIAGNOSIS — R197 Diarrhea, unspecified: Secondary | ICD-10-CM | POA: Diagnosis not present

## 2019-08-22 DIAGNOSIS — E876 Hypokalemia: Secondary | ICD-10-CM | POA: Insufficient documentation

## 2019-08-22 LAB — CBC WITH DIFFERENTIAL/PLATELET
Basophils Absolute: 0 10*3/uL (ref 0.0–0.1)
Basophils Relative: 0.3 % (ref 0.0–3.0)
Eosinophils Absolute: 0.2 10*3/uL (ref 0.0–0.7)
Eosinophils Relative: 2 % (ref 0.0–5.0)
HCT: 36.3 % (ref 36.0–46.0)
Hemoglobin: 12 g/dL (ref 12.0–15.0)
Lymphocytes Relative: 30.5 % (ref 12.0–46.0)
Lymphs Abs: 2.9 10*3/uL (ref 0.7–4.0)
MCHC: 33.1 g/dL (ref 30.0–36.0)
MCV: 92.4 fl (ref 78.0–100.0)
Monocytes Absolute: 1 10*3/uL (ref 0.1–1.0)
Monocytes Relative: 10.3 % (ref 3.0–12.0)
Neutro Abs: 5.5 10*3/uL (ref 1.4–7.7)
Neutrophils Relative %: 56.9 % (ref 43.0–77.0)
Platelets: 237 10*3/uL (ref 150.0–400.0)
RBC: 3.93 Mil/uL (ref 3.87–5.11)
RDW: 13.6 % (ref 11.5–15.5)
WBC: 9.6 10*3/uL (ref 4.0–10.5)

## 2019-08-22 LAB — COMPREHENSIVE METABOLIC PANEL
ALT: 9 U/L (ref 0–35)
AST: 14 U/L (ref 0–37)
Albumin: 3.8 g/dL (ref 3.5–5.2)
Alkaline Phosphatase: 63 U/L (ref 39–117)
BUN: 8 mg/dL (ref 6–23)
CO2: 35 mEq/L — ABNORMAL HIGH (ref 19–32)
Calcium: 9.2 mg/dL (ref 8.4–10.5)
Chloride: 104 mEq/L (ref 96–112)
Creatinine, Ser: 0.63 mg/dL (ref 0.40–1.20)
GFR: 90.23 mL/min (ref >60.00)
Glucose, Bld: 87 mg/dL (ref 70–99)
Potassium: 3.8 mEq/L (ref 3.5–5.1)
Sodium: 143 mEq/L (ref 135–145)
Total Bilirubin: 0.7 mg/dL (ref 0.2–1.2)
Total Protein: 6.8 g/dL (ref 6.0–8.3)

## 2019-08-22 NOTE — Assessment & Plan Note (Addendum)
Was tx at [facility] clinic on 6/2 with cipro and flagyl and zofran, then again at ER on 6/14 with fluids and K and mag  Overall diarrhea has slowed and without blood at all  Biggest complaint is exhaustion and poor appetite   Reviewed hospital records, lab results and studies in detail    Has known diverticulosis  Last colonoscopy and EGD rev  Low threshold for GI f/u if no continued improvement   inst to change diet to bland and gradually adv as tol Watch for fever/abd pain or other new symptoms  Fluids and rest  Update in a week bmet and cbc today

## 2019-08-22 NOTE — Assessment & Plan Note (Signed)
2.5 in ER after much diarrhea  Replaced  Ectopy on EKG resolved after this  Now stools have slowed  Is eating and drinking Still fatigued  bmet drawn today

## 2019-08-22 NOTE — Progress Notes (Signed)
Subjective:    Patient ID: Erika Evans, female    DOB: February 14, 1935, 85 y.o.   MRN: [id number]  This visit occurred during the SARS-CoV-2 public health emergency.  Safety protocols were in place, including screening questions prior to the visit, additional usage of staff PPE, and extensive cleaning of exam room while observing appropriate contact time as indicated for disinfecting solutions.    HPI Pt presents for f/u of ER visit on 08/20/19 for rectal bleeding   Presented with bloody stools Reported 2-3 episodes daily of diarrhea with blood for several days  No blood thinners  No unintentional wt loss  No n/v or fever  Last food was a 1000 island salad dressing (salad with lettuce and tomato)  She c/o of aching all over and bloating  Thinks she had a fever  She also had a rash on her scalp (?) -was itchy   Before this she was seen for suspected bacterial gastroenteritis and tx with cipro and flagyl and zofran (6/2 at [facility])    Noted past h/o blood in stool and melena for 2 mo intermittently  Had egd and colonoscopy - showing diverticulosis and internal hemorrhoids    Labs:   Chemistry      Component Value Date/Time   NA 143 08/20/2019 1847   K 2.9 (L) 08/20/2019 1847   CL 104 08/20/2019 1847   CO2 28 08/20/2019 1847   BUN 9 08/20/2019 1847   CREATININE 0.68 08/20/2019 1847      Component Value Date/Time   CALCIUM 9.2 08/20/2019 1847   ALKPHOS 85 12/20/2018 1058   AST 17 12/20/2018 1058   ALT 14 12/20/2018 1058   BILITOT 0.6 12/20/2018 1058      Lab Results  Component Value Date   WBC 10.3 08/20/2019   HGB 12.9 08/20/2019   HCT 39.4 08/20/2019   MCV 92.3 08/20/2019   PLT 258 08/20/2019   glucose 100  K was quite low  EKG was ok except for some PAC   (this improved after tx with mag and K)  No further bleeding in ER and her stool was heme neg   Reviewed colonoscopy with Dr Carlean Purl 1/21 -diminutive adenoma and diverticulosis with internal  hemorrhoids  No recall due to age EGD-entirely normal with no bx needed   Wt Readings from Last 3 Encounters:  08/22/19 153 lb 7 oz (69.6 kg)  08/20/19 150 lb (68 kg)  04/04/19 150 lb (68 kg)   29.97 kg/m  She still does not feel great  Very tired  Stools are loose (not watery at all)  Some gas  Wipes a little blood (nothing more that)   ? If this was food bourne or viral  She did buy some fresh green beans from a produce stand  When she cooked them- they had some "gravel" around them   Given cipro and flagyl and finished it   Separately- has px for triamcinolone cream from derm 0.1% Uses for itchy rashes   Patient Active Problem List   Diagnosis Date Noted  . Bloody diarrhea 08/22/2019  . Hypokalemia 08/22/2019  . Positive fecal immunochemical test 02/15/2019  . Venous insufficiency of both lower extremities 12/21/2017  . Routine general medical examination at a health care facility 01/28/2015  . Screening for colon cancer 01/28/2015  . Estrogen deficiency 01/28/2015  . Encounter for Medicare annual wellness exam 11/26/2012  . GERD (gastroesophageal reflux disease) 04/21/2011  . OA (osteoarthritis) 04/21/2011  . Hyperlipidemia 04/21/2011  .  Osteopenia 04/21/2011  . Obese 04/21/2011   Past Medical History:  Diagnosis Date  . Cataract 2019   resolved with surgery  . Congenital fusion of cervical spine   . Elevated liver enzymes   . Foot pain    Left; nerve problem; procedure  . GERD (gastroesophageal reflux disease)   . Hepatitis B carrier ([place])    Positive hep B test at [facility]  . Hyperlipidemia    Mild  . Osteoarthritis   . Osteopenia   . Pneumonia 2004   Past Surgical History:  Procedure Laterality Date  . "Blocked kidney"    . BACK SURGERY    . CARDIOVASCULAR STRESS TEST  9/07   Neg  . CATARACT EXTRACTION W/ INTRAOCULAR LENS IMPLANT Right 05/06/2017   Dr. Tommy Rainwater  . CATARACT EXTRACTION W/ INTRAOCULAR LENS IMPLANT Left 05/20/2017   Dr. Tommy Rainwater    . COLONOSCOPY  9/06   Diverticulosis, hems  . COLONOSCOPY  04/04/2019  . Dexa     Osteopenis 9/01, improved 10/03, decreased BMD 5/06  . PARTIAL HYSTERECTOMY    . UPPER GASTROINTESTINAL ENDOSCOPY  04/04/2019  . US ECHOCARDIOGRAPHY  7/03   Abd-normal  . US RENAL/AORTA  2007   Neg   Social History   Tobacco Use  . Smoking status: Never Smoker  . Smokeless tobacco: Never Used  Vaping Use  . Vaping Use: Never used  Substance Use Topics  . Alcohol use: No    Alcohol/week: 0.0 standard drinks  . Drug use: No   Family History  Problem Relation Age of Onset  . Lung cancer Father   . Cancer Sister        Bladder  . Heart attack Brother   . Leukemia Brother   . Stroke Brother   . Colon cancer Neg Hx   . Colon polyps Neg Hx   . Esophageal cancer Neg Hx   . Rectal cancer Neg Hx   . Stomach cancer Neg Hx    Allergies  Allergen Reactions  . Alendronate Sodium     REACTION: acid reflux  . Miacalcin [Calcitonin ([place])] Other (See Comments)    Pt thinks the spray gave her a staph infection in her nose   . Raloxifene     REACTION: severe muscle pain and weakness   Current Outpatient Medications on File Prior to Visit  Medication Sig Dispense Refill  . Acetaminophen (TYLENOL ARTHRITIS PAIN PO) Take 1 tablet by mouth daily.     Marland Kitchen albuterol (PROVENTIL HFA;VENTOLIN HFA) 108 (90 BASE) MCG/ACT inhaler Inhale 2 puffs into the lungs every 4 (four) hours as needed for wheezing or shortness of breath. 1 Inhaler 3  . aspirin 81 MG tablet Take 81 mg by mouth daily.      . Bepotastine Besilate (BEPREVE) 1.5 % SOLN Place 1 drop into both eyes as needed.    . Cholecalciferol (VITAMIN D-3) 1000 UNITS CAPS Take 2 capsules by mouth daily.    . fish oil-omega-3 fatty acids 1000 MG capsule Take 1 g by mouth 2 (two) times daily.     . Fluticasone-Salmeterol (ADVAIR DISKUS) 250-50 MCG/DOSE AEPB Inhale 1 puff into the lungs every 12 (twelve) hours.     Marland Kitchen glucosamine-chondroitin 500-400 MG tablet  Take 1 tablet by mouth daily.    Marland Kitchen levocetirizine (XYZAL) 5 MG tablet Take 5 mg by mouth at bedtime.    . montelukast (SINGULAIR) 10 MG tablet Take 10 mg by mouth daily.      Marland Kitchen omeprazole (PRILOSEC)  20 MG capsule Take 1 capsule (20 mg total) by mouth 2 (two) times daily before a meal. 180 capsule 3  . Probiotic Product (PROBIOTIC PO) Take 1 tablet by mouth daily.     No current facility-administered medications on file prior to visit.    Review of Systems  Constitutional: Positive for activity change, appetite change and fatigue. Negative for fever and unexpected weight change.  HENT: Negative for congestion, ear pain, rhinorrhea, sinus pressure and sore throat.   Eyes: Negative for pain, redness and visual disturbance.  Respiratory: Negative for cough, shortness of breath and wheezing.   Cardiovascular: Negative for chest pain and palpitations.  Gastrointestinal: Positive for diarrhea. Negative for abdominal pain, anal bleeding, blood in stool, constipation, nausea and vomiting.       Still feels bloated but not as much  Endocrine: Negative for polydipsia and polyuria.  Genitourinary: Negative for dysuria, frequency, hematuria and urgency.  Musculoskeletal: Negative for arthralgias, back pain and myalgias.  Skin: Negative for pallor and rash.       Had a rash on scalp that is gone now  Allergic/Immunologic: Negative for environmental allergies.  Neurological: Negative for dizziness, syncope and headaches.  Hematological: Negative for adenopathy. Does not bruise/bleed easily.  Psychiatric/Behavioral: Negative for decreased concentration and dysphoric mood. The patient is not nervous/anxious.        Objective:   Physical Exam Constitutional:      General: She is not in acute distress.    Appearance: She is well-developed. She is not ill-appearing, toxic-appearing or diaphoretic.     Comments: overwt  Well app  HENT:     Head: Normocephalic and atraumatic.     Mouth/Throat:      Mouth: Mucous membranes are moist.  Eyes:     General: No scleral icterus.    Conjunctiva/sclera: Conjunctivae normal.     Pupils: Pupils are equal, round, and reactive to light.  Cardiovascular:     Rate and Rhythm: Normal rate and regular rhythm.     Heart sounds: Normal heart sounds.  Pulmonary:     Effort: Pulmonary effort is normal. No respiratory distress.     Breath sounds: Normal breath sounds. No wheezing or rales.     Comments: Good air exch  Not sob with exertion or speech Abdominal:     General: Abdomen is protuberant. Bowel sounds are normal. There is no distension.     Palpations: Abdomen is rigid. There is no hepatomegaly, splenomegaly, mass or pulsatile mass.     Tenderness: There is no abdominal tenderness. There is no right CVA tenderness, left CVA tenderness, guarding or rebound. Negative signs include Murphy's sign and McBurney's sign.     Comments: No abd tenderness today  Musculoskeletal:     Cervical back: Normal range of motion and neck supple.     Right lower leg: No edema.     Left lower leg: No edema.  Lymphadenopathy:     Cervical: No cervical adenopathy.  Skin:    General: Skin is warm and dry.     Coloration: Skin is not jaundiced or pale.     Findings: No erythema.     Comments: Nl color and turgor  Neurological:     Mental Status: She is alert.     Coordination: Coordination normal.     Deep Tendon Reflexes: Reflexes normal.  Psychiatric:        Mood and Affect: Mood is anxious.        Cognition and Memory: Cognition  normal.     Comments: Mildly anxious  Pleasant  Fairly good historian            Assessment & Plan:   Problem List Items Addressed This Visit      Digestive   Bloody diarrhea - Primary    Was tx at [facility] clinic on 6/2 with cipro and flagyl and zofran, then again at ER on 6/14 with fluids and K and mag  Overall diarrhea has slowed and without blood at all  Biggest complaint is exhaustion and poor appetite    Reviewed hospital records, lab results and studies in detail    Has known diverticulosis  Last colonoscopy and EGD rev  Low threshold for GI f/u if no continued improvement   inst to change diet to bland and gradually adv as tol Watch for fever/abd pain or other new symptoms  Fluids and rest  Update in a week bmet and cbc today         Relevant Orders   Comprehensive metabolic panel   CBC with Differential/Platelet     Other   Hypokalemia    2.5 in ER after much diarrhea  Replaced  Ectopy on EKG resolved after this  Now stools have slowed  Is eating and drinking Still fatigued  bmet drawn today      Relevant Orders   Comprehensive metabolic panel

## 2019-08-22 NOTE — Patient Instructions (Addendum)
I think you have had either a bacterial or viral infection of GI system I'm glad you took the antibiotics I expect you to very gradually improve   Keep diet bland (bananas, rice, apple sauce and toast)  Small portions Keep drinking fluids   I want to check labs today - blood count and chemistries   Rest and then gradually increase your activity as you get energy back -this will take a while   Let us know if the loose stool does not slowly continue to improve or if you develop any new symptoms

## 2019-08-24 ENCOUNTER — Telehealth: Payer: Self-pay

## 2019-08-24 DIAGNOSIS — L299 Pruritus, unspecified: Secondary | ICD-10-CM | POA: Insufficient documentation

## 2019-08-24 MED ORDER — NYSTATIN 100000 UNIT/ML MT SUSP
5.0000 mL | Freq: Three times a day (TID) | OROMUCOSAL | 0 refills | Status: DC
Start: 2019-08-24 — End: 2020-02-06

## 2019-08-24 NOTE — Telephone Encounter (Signed)
Pt was seen 08/22/2019, she called stating that the rash on her scalp has not improved and it seems more itchy and now she seems to be experiencing itching on her neck... pt states she has tried OTC shampoo on scalp and body with no relief...Marland Kitchen please advise

## 2019-08-24 NOTE — Telephone Encounter (Signed)
Pt was informed of nystatin called in and had already picked it up. She was also notified that she can be around people but to avoid the sun as much as possible. She understood.

## 2019-08-24 NOTE — Telephone Encounter (Signed)
Since she has tried otc product w/o relief I placed a derm referral and will route to [facility] Let her know please

## 2019-08-24 NOTE — Telephone Encounter (Signed)
Pt notified referral done.   Pt did have 2 questions she wanted to ask Dr. Glori Bickers. Pt said she since spots are no better she wanted to know can she be around people, or does she have to stay inside until she see's derm. pt also said she thinks she has thrush. Pt has a white coating on her tongue that's not getting any better pt said she's been trying to brush it off and it's not working. She thinks it's starting to worsen and she wanted to know what should she do about it.   [facility]

## 2019-08-24 NOTE — Telephone Encounter (Signed)
I sent in nystatin solution for thrush - keep me posted  It is ok to be around other people - but avoid sun exposure on scalp

## 2019-09-14 ENCOUNTER — Telehealth: Payer: Self-pay

## 2019-09-14 NOTE — Telephone Encounter (Signed)
I need to know what is going on  Neuro surgery will usually not see folks w/o a very recent MRI  Let me know when you get a hold of her  thanks

## 2019-09-14 NOTE — Telephone Encounter (Signed)
Patient contacted the office requesting a referral to a neurosurgeon, to hopefully get a rx for a muscle relaxant. Patient states she has "several illness's", but she states she is just in pain and needs some relief.  I attempted to contact patient back to see what was in pain, and what we could do to help her, but she did not answer. I will try again later. I will route to [place] also as FYI.

## 2019-12-05 ENCOUNTER — Ambulatory Visit
Admission: EM | Admit: 2019-12-05 | Discharge: 2019-12-05 | Disposition: A | Discharge status: Home / Self Care | Payer: Medicare Other | Attending: Emergency Medicine | Admitting: Emergency Medicine

## 2019-12-05 ENCOUNTER — Other Ambulatory Visit: Payer: Self-pay

## 2019-12-05 ENCOUNTER — Telehealth: Payer: Self-pay

## 2019-12-05 DIAGNOSIS — R197 Diarrhea, unspecified: Secondary | ICD-10-CM

## 2019-12-05 DIAGNOSIS — B349 Viral infection, unspecified: Secondary | ICD-10-CM | POA: Diagnosis not present

## 2019-12-05 NOTE — Telephone Encounter (Signed)
I spoke with Levan Hurst and she said her sister Erika Evans is the person that is sick.  I was unable to reach pt at any contact # and I spoke with [name] (DPR signed) and she is not sure where pt is but she will try and reach pt and have her call [facility] with update how she is feeling and did pt go to UC. Sending note to Dr Glori Bickers and Rexene Agent CMA and [facility] LPN.

## 2019-12-05 NOTE — Telephone Encounter (Signed)
Pt returned your call. Says she is feeling the same but food is running right thru her today.  She is at [facility] on [address]. Currently waiting on them to do covid test.  You can return her call and if you don't reach her it's ok to call her sister.  Thank you!

## 2019-12-05 NOTE — Telephone Encounter (Signed)
[facility] - Client TELEPHONE ADVICE RECORD [facility] Patient Name: Erika Evans Gender: Female DOB: 10-Feb-1935 Age: 85 Y 64 M 12 D Return Phone Number: [phone] (Primary), [phone] (Secondary) Address[place] [address] Client [place] [facility] - Client Client Site [place] MD Contact Type Call Who Is Calling Patient / Member / Family / Caregiver Call Type Triage / Clinical Relationship To Patient Self Return Phone Number [phone] (Primary) Chief Complaint Headache Reason for Call Symptomatic / Request for [name] states she has body aches diarrhea fever and headache. [name] Not Listed No appointments available in next 4 hours, patient going to [facility] in [place], [place]. Translation No Nurse Assessment Nurse: [name], RN, [name] Date/Time [name] Time): 12/05/2019 9:21:21 AM Confirm and document reason for call. If symptomatic, describe symptoms. ---Caller states she has a headache, body aches, diarrhea and has had a low grade fever for the past 2 days. Current temperature is 101 orally Does the patient have any new or worsening symptoms? ---Yes Will a triage be completed? ---Yes Related visit to physician within the last 2 weeks? ---No Does the PT have any chronic conditions? (i.e. diabetes, asthma, this includes High risk factors for pregnancy, etc.) ---Yes List chronic conditions. ---Acid reflux Is this a behavioral health or substance abuse call? ---No Guidelines Guideline Title Affirmed Question Affirmed Notes Nurse Date/Time (Eastern Time) COVID-19 - Diagnosed or Suspected [1] Fever > 101 F (38.3 C) AND [2] age > 58 years [name], RN, [name] 12/05/2019 9:24:17 AM Disp. Time [name] Time) Disposition Final User 12/05/2019 9:40:14 AM See HCP within 4 Hours (or PCP triage) Yes  [name], RN, [name] PLEASE NOTE: All timestamps contained within this report are represented as [place] Standard Time. CONFIDENTIALTY NOTICE: This fax transmission is intended only for the addressee. It contains information that is legally privileged, confidential or otherwise protected from use or disclosure. If you are not the intended recipient, you are strictly prohibited from reviewing, disclosing, copying using or disseminating any of this information or taking any action in reliance on or regarding this information. If you have received this fax in error, please notify us immediately by telephone so that we can arrange for its return to [place]. Phone: [phone], Toll-Free: [phone], Fax: [phone] Page: 2 of 2 Call Id: [id number] [place] Disagree/Comply Comply Caller Understands Yes PreDisposition Did not know what to do Care Advice Given Per Guideline SEE HCP (OR PCP TRIAGE) WITHIN 4 HOURS: * IF OFFICE WILL BE OPEN: You need to be seen within the next 3 or 4 hours. Call your doctor (or NP/PA) now or as soon as the office opens. * Fever: For fever over 101 F (38.3 C), take acetaminophen every 4 to 6 hours (Adults 650 mg) OR ibuprofen every 6 to 8 hours (Adults 400 mg). Before taking any medicine, read all the instructions on the package. Do not take aspirin unless your doctor has prescribed it for you. * Feeling dehydrated: Drink extra liquids. If the air in your home is dry, use a humidifier. GENERAL CARE ADVICE FOR COVID-19 SYMPTOMS: * Muscle aches, headache, and other pains: Often this comes and goes with the fever. Take acetaminophen every 4 to 6 hours (Adults 650 mg) OR ibuprofen every 6 to 8 hours (Adults 400 mg). Before taking any medicine, read all the instructions on the package. CALL BACK IF: * You become worse CARE ADVICE  given per COVID-19 - DIAGNOSED OR SUSPECTED (Adult) guideline. Nurse called back line to check on appointment but there are no appointments within the next 4  hours, so patient was advised to go to urgent care. She is asking if nurse knows of one where there isn't a long wait, explained I wasn't sure but listed off some urgent cares near her where she could go to or call. She wanted the address for [facility] in [place], nurse also provided her with the #. She has no further questions and knows to be seen within the next 4 hours. Referrals GO TO FACILITY OTHER - SPECIFY

## 2019-12-05 NOTE — Telephone Encounter (Signed)
Thanks for letting me know - it sounds like she is getting care  I will watch for correspondence  Please check in with her tomorrow

## 2019-12-05 NOTE — Discharge Instructions (Addendum)
Your COVID test is pending.  You should self quarantine until the test result is back.    Take Tylenol as needed for fever or discomfort.  Rest and keep yourself hydrated.    Go to the emergency department if you develop acute worsening symptoms.     

## 2019-12-05 NOTE — ED Triage Notes (Signed)
Patient c/o diarrhea, body aches, and low grade fever x2 days. Requesting COVID test.

## 2019-12-05 NOTE — ED Provider Notes (Signed)
Erika Evans    CSN: [id number] Arrival date & time: 12/05/19  1245      History   Chief Complaint Chief Complaint  Patient presents with  . Generalized Body Aches  . Diarrhea  . Fever    low grade    HPI Erika Evans is a 85 y.o. female.   Patient presents with body aches, diarrhea, low-grade fever x2 days.  T-max 99.3.  Patient denies rash, sore throat, cough, shortness of breath, vomiting, or other symptoms.  No treatments attempted at home.  Patient's medical history includes pneumonia, hyperlipidemia, GERD, osteoarthritis.  Patient has not received the COVID vaccines.  The history is provided by the patient.    Past Medical History:  Diagnosis Date  . Cataract 2019   resolved with surgery  . Congenital fusion of cervical spine   . Elevated liver enzymes   . Foot pain    Left; nerve problem; procedure  . GERD (gastroesophageal reflux disease)   . Hepatitis B carrier ([place])    Positive hep B test at [facility]  . Hyperlipidemia    Mild  . Osteoarthritis   . Osteopenia   . Pneumonia 2004    Patient Active Problem List   Diagnosis Date Noted  . Scalp itch 08/24/2019  . Bloody diarrhea 08/22/2019  . Hypokalemia 08/22/2019  . Positive fecal immunochemical test 02/15/2019  . Venous insufficiency of both lower extremities 12/21/2017  . Routine general medical examination at a health care facility 01/28/2015  . Screening for colon cancer 01/28/2015  . Estrogen deficiency 01/28/2015  . Encounter for Medicare annual wellness exam 11/26/2012  . GERD (gastroesophageal reflux disease) 04/21/2011  . OA (osteoarthritis) 04/21/2011  . Hyperlipidemia 04/21/2011  . Osteopenia 04/21/2011  . Obese 04/21/2011    Past Surgical History:  Procedure Laterality Date  . "Blocked kidney"    . BACK SURGERY    . CARDIOVASCULAR STRESS TEST  9/07   Neg  . CATARACT EXTRACTION W/ INTRAOCULAR LENS IMPLANT Right 05/06/2017   Dr. Tommy Rainwater  . CATARACT EXTRACTION W/  INTRAOCULAR LENS IMPLANT Left 05/20/2017   Dr. Tommy Rainwater  . COLONOSCOPY  9/06   Diverticulosis, hems  . COLONOSCOPY  04/04/2019  . Dexa     Osteopenis 9/01, improved 10/03, decreased BMD 5/06  . PARTIAL HYSTERECTOMY    . UPPER GASTROINTESTINAL ENDOSCOPY  04/04/2019  . US ECHOCARDIOGRAPHY  7/03   Abd-normal  . US RENAL/AORTA  2007   Neg    OB History   No obstetric history on file.      Home Medications    Prior to Admission medications   Medication Sig Start Date End Date Taking? Authorizing Provider  Acetaminophen (TYLENOL ARTHRITIS PAIN PO) Take 1 tablet by mouth daily.     [provider]  albuterol (PROVENTIL HFA;VENTOLIN HFA) 108 (90 BASE) MCG/ACT inhaler Inhale 2 puffs into the lungs every 4 (four) hours as needed for wheezing or shortness of breath. 01/28/15   Tower, Wynelle Fanny, MD  aspirin 81 MG tablet Take 81 mg by mouth daily.      [provider]  Bepotastine Besilate (BEPREVE) 1.5 % SOLN Place 1 drop into both eyes as needed.    [provider]  Cholecalciferol (VITAMIN D-3) 1000 UNITS CAPS Take 2 capsules by mouth daily.    [provider]  fish oil-omega-3 fatty acids 1000 MG capsule Take 1 g by mouth 2 (two) times daily.     [provider]  Fluticasone-Salmeterol (ADVAIR  DISKUS) 250-50 MCG/DOSE AEPB Inhale 1 puff into the lungs every 12 (twelve) hours.     [provider]  glucosamine-chondroitin 500-400 MG tablet Take 1 tablet by mouth daily.    [provider]  levocetirizine (XYZAL) 5 MG tablet Take 5 mg by mouth at bedtime.    [provider]  montelukast (SINGULAIR) 10 MG tablet Take 10 mg by mouth daily.      [provider]  nystatin (MYCOSTATIN) 100000 UNIT/ML suspension Take 5 mLs (500,000 Units total) by mouth 3 (three) times daily. Swish and swallow 08/24/19   Tower, Wynelle Fanny, MD  omeprazole (PRILOSEC) 20 MG capsule Take 1 capsule (20 mg total) by mouth 2 (two) times daily before a  meal. 12/27/18   Tower, Wynelle Fanny, MD  Probiotic Product (PROBIOTIC PO) Take 1 tablet by mouth daily.    [provider]    Family History Family History  Problem Relation Age of Onset  . Lung cancer Father   . Cancer Sister        Bladder  . Heart attack Brother   . Leukemia Brother   . Stroke Brother   . Colon cancer Neg Hx   . Colon polyps Neg Hx   . Esophageal cancer Neg Hx   . Rectal cancer Neg Hx   . Stomach cancer Neg Hx     Social History Social History   Tobacco Use  . Smoking status: Never Smoker  . Smokeless tobacco: Never Used  Vaping Use  . Vaping Use: Never used  Substance Use Topics  . Alcohol use: No    Alcohol/week: 0.0 standard drinks  . Drug use: No     Allergies   Alendronate sodium, Miacalcin [calcitonin (salmon)], and Raloxifene   Review of Systems Review of Systems  Constitutional: Positive for fever. Negative for chills.  HENT: Negative for ear pain and sore throat.   Eyes: Negative for pain and visual disturbance.  Respiratory: Negative for cough and shortness of breath.   Cardiovascular: Negative for chest pain and palpitations.  Gastrointestinal: Positive for diarrhea. Negative for abdominal pain and vomiting.  Genitourinary: Negative for dysuria and hematuria.  Musculoskeletal: Positive for arthralgias. Negative for back pain.  Skin: Negative for color change and rash.  Neurological: Negative for seizures and syncope.  All other systems reviewed and are negative.    Physical Exam Triage Vital Signs ED Triage Vitals  Enc Vitals Group     BP      Pulse      Resp      Temp      Temp src      SpO2      Weight      Height      Head Circumference      Peak Flow      Pain Score      Pain Loc      Pain Edu?      Excl. in [place]?    No data found.  Updated Vital Signs BP 110/61   Pulse 96   Temp 99.3 F (37.4 C)   Resp 15   SpO2 94%   Visual Acuity Right Eye Distance:   Left Eye Distance:   Bilateral  Distance:    Right Eye Near:   Left Eye Near:    Bilateral Near:     Physical Exam Vitals and nursing note reviewed.  Constitutional:      General: She is not in acute distress.  Appearance: She is well-developed.  HENT:     Head: Normocephalic and atraumatic.     Right Ear: Tympanic membrane normal.     Left Ear: Tympanic membrane normal.     Nose: Nose normal.     Mouth/Throat:     Mouth: Mucous membranes are moist.     Pharynx: Oropharynx is clear.  Eyes:     Conjunctiva/sclera: Conjunctivae normal.  Cardiovascular:     Rate and Rhythm: Normal rate and regular rhythm.     Heart sounds: No murmur heard.   Pulmonary:     Effort: Pulmonary effort is normal. No respiratory distress.     Breath sounds: Normal breath sounds.  Abdominal:     Palpations: Abdomen is soft.     Tenderness: There is no abdominal tenderness. There is no guarding or rebound.  Musculoskeletal:     Cervical back: Neck supple.  Skin:    General: Skin is warm and dry.     Findings: No rash.  Neurological:     General: No focal deficit present.     Mental Status: She is alert and oriented to person, place, and time.     Gait: Gait normal.  Psychiatric:        Mood and Affect: Mood normal.        Behavior: Behavior normal.      UC Treatments / Results  Labs (all labs ordered are listed, but only abnormal results are displayed) Labs Reviewed  NOVEL CORONAVIRUS, NAA    EKG   Radiology No results found.  Procedures Procedures (including critical care time)  Medications Ordered in UC Medications - No data to display  Initial Impression / Assessment and Plan / UC Course  I have reviewed the triage vital signs and the nursing notes.  Pertinent labs & imaging results that were available during my care of the patient were reviewed by me and considered in my medical decision making (see chart for details).   Viral illness.  PCR COVID pending.  Instructed patient to self quarantine until  the test result is back.  Discussed symptomatic treatment including Tylenol, rest, hydration.  Instructed patient to go to the ED if she has acute worsening symptoms.  Patient agrees to plan of care.    Final Clinical Impressions(s) / UC Diagnoses   Final diagnoses:  Viral illness     Discharge Instructions     Your COVID test is pending.  You should self quarantine until the test result is back.    Take Tylenol as needed for fever or discomfort.  Rest and keep yourself hydrated.    Go to the emergency department if you develop acute worsening symptoms.        ED Prescriptions    None     PDMP not reviewed this encounter.   Sharion Balloon, NP 12/05/19 [phone]

## 2019-12-06 LAB — NOVEL CORONAVIRUS, NAA: SARS-CoV-2, NAA: NOT DETECTED

## 2019-12-06 LAB — SARS-COV-2, NAA 2 DAY TAT

## 2019-12-06 NOTE — Telephone Encounter (Signed)
I reviewed her outside note- w/u incl labs were re assuring and she got some K . Original c/o blood in stool/melana - this was resolved by the time she was eval with guiac neg stool and nl cbc (reassuring) Is she having any more blood or just diarrhea?  How about abd pain?  Is she taking anything otc for diarrhea? (pepto or immodium, etc?)

## 2019-12-06 NOTE — Telephone Encounter (Signed)
Spoke with pt and she said she is just having severe diarrhea. Pt said she "can't keep anything down", if she drinks or eats anything about 30 mins later she is running to the bathroom with diarrhea, pt said the diarrhea is "straight water" not loose stools it actual diarrhea each time. Pt said she hasn't had anymore blood in stool and she isn't having bad abd pain but she will have some soreness in her stomach after she eats or drinks and right before the diarrhea happens again. Pt said someone is on the way to the store right now to get her some pepto but pt hasn't tried any OTC meds yet

## 2019-12-06 NOTE — Telephone Encounter (Signed)
Keep drinking fluids  Immodium is ok for diarrhea -twice daily (stop if abd pain)  If blood returns please let us know  Schedule f/u with me next week please (want to re check labs as well)  I also want to ref to GI for f/u appt. If she is ok with that-has seen Dr Carlean Purl in the past Let me know if she is open to that   If severe or dizzy /dehydrated-return to [facility] or ER for urgent eval

## 2019-12-06 NOTE — Telephone Encounter (Signed)
See prev note. Looks like covid test was negative

## 2019-12-06 NOTE — Telephone Encounter (Signed)
Patient called in stating she is having diarrhea and is wondering what can assist with this. Patient is still awaiting COVID results. Please advise.

## 2019-12-07 NOTE — Addendum Note (Signed)
Addended by: Loura Pardon A on: 12/07/2019 01:21 PM   Modules accepted: Orders

## 2019-12-07 NOTE — Telephone Encounter (Signed)
Pt notified of Dr. Marliss Coots comments and instructions, appt scheduled next week with PCP, and pt does want a referral back to GI. She said she is okay seeing Dr. Carlean Purl again. Please put referral in

## 2019-12-07 NOTE — Telephone Encounter (Signed)
Order done  Will route to [facility]

## 2019-12-12 ENCOUNTER — Ambulatory Visit: Payer: Medicare Other | Admitting: Family Medicine

## 2019-12-12 ENCOUNTER — Encounter: Payer: Self-pay | Admitting: Family Medicine

## 2019-12-12 ENCOUNTER — Other Ambulatory Visit: Payer: Self-pay

## 2019-12-12 VITALS — BP 120/68 | HR 74 | Temp 96.9°F | Ht 60.0 in | Wt 149.1 lb

## 2019-12-12 DIAGNOSIS — Z23 Encounter for immunization: Secondary | ICD-10-CM | POA: Diagnosis not present

## 2019-12-12 DIAGNOSIS — R197 Diarrhea, unspecified: Secondary | ICD-10-CM | POA: Diagnosis not present

## 2019-12-12 DIAGNOSIS — Z28310 Unvaccinated for covid-19: Secondary | ICD-10-CM | POA: Insufficient documentation

## 2019-12-12 DIAGNOSIS — Z2821 Immunization not carried out because of patient refusal: Secondary | ICD-10-CM

## 2019-12-12 NOTE — Assessment & Plan Note (Signed)
Encouraged her to consider vaccination  She is worried about "government controlling her"  Disc safety profile of vaccine in setting of risk of covid She will think about it

## 2019-12-12 NOTE — Patient Instructions (Addendum)
If you are not having bowel movement at all- back off the immodium and pepto  If abdominal pain or symptoms come back let us know   We will keep working on GI appointment and call you   Please keep up with fluids  Drink water  Eat a balance diet best you can

## 2019-12-12 NOTE — Assessment & Plan Note (Signed)
2nd episode since June (this time was not bloody like previous) Now improved using pepto/immodium (did inst to back off those now since it has slowed down) Seen in UC 9/29 -also had low grade temp then and covid test was negative  Reassuring exam  Reviewed last colonoscopy as well as labs  Ref was made to GI (working on that)  Enc her to eat a balanced diet and continue probiotic  inst to update if diarrhea returns when she backs off of pepto and immodium

## 2019-12-12 NOTE — Progress Notes (Signed)
Subjective:    Patient ID: Erika Evans, female    DOB: 1934/06/15, 85 y.o.   MRN: [id number]  This visit occurred during the SARS-CoV-2 public health emergency.  Safety protocols were in place, including screening questions prior to the visit, additional usage of staff PPE, and extensive cleaning of exam room while observing appropriate contact time as indicated for disinfecting solutions.    HPI Pt presents with Gi symptoms   Wt Readings from Last 3 Encounters:  12/12/19 149 lb 1 oz (67.6 kg)  08/22/19 153 lb 7 oz (69.6 kg)  08/20/19 150 lb (68 kg)   29.11 kg/m  She has had trouble with recurrent symptoms  Starts with head tightness/almost a headache  Then the GI symptoms/diarrhea   This last bout of diarrhea lasted from late sept until yesterday  pepto bismol helped (otherwise watery stool) , also immodium  She did get weak  No blood in stool this time (did back in June)  Had nausea without vomiting  Did see mucous a few times- better now    Feeling better overall today -no BM since a week since bm (but does not feel constipated) some gas  Thinks there was not a lot of stool to move  If any pain-is up in epigastric area   Allergy issues - pnd and scratchy throat lately  Has appt with allergist soon    She was seen recently in the ER 9/29 Dx with viral illness- with body aches , diarrhea and low grate temp  Tested for covid -neg  Prior to that -had issues with diarrhea/ with blood  tx at [facility] with cipro and flagyl  Known h/o diverticulosis   She has a pending referral for GI (has seen Dr Carlean Purl)  Lab Results  Component Value Date   WBC 9.6 08/22/2019   HGB 12.0 08/22/2019   HCT 36.3 08/22/2019   MCV 92.4 08/22/2019   PLT 237.0 08/22/2019   Lab Results  Component Value Date   CREATININE 0.63 08/22/2019   BUN 8 08/22/2019   NA 143 08/22/2019   K 3.8 08/22/2019   CL 104 08/22/2019   CO2 35 (H) 08/22/2019   Last colonoscopy was 1/21    Diverticulitis and several polyps   Patient Active Problem List   Diagnosis Date Noted  . COVID-19 vaccine series declined 12/12/2019  . Scalp itch 08/24/2019  . Diarrhea 08/22/2019  . Hypokalemia 08/22/2019  . Positive fecal immunochemical test 02/15/2019  . Venous insufficiency of both lower extremities 12/21/2017  . Routine general medical examination at a health care facility 01/28/2015  . Screening for colon cancer 01/28/2015  . Estrogen deficiency 01/28/2015  . Encounter for Medicare annual wellness exam 11/26/2012  . GERD (gastroesophageal reflux disease) 04/21/2011  . OA (osteoarthritis) 04/21/2011  . Hyperlipidemia 04/21/2011  . Osteopenia 04/21/2011  . Obese 04/21/2011   Past Medical History:  Diagnosis Date  . Cataract 2019   resolved with surgery  . Congenital fusion of cervical spine   . Elevated liver enzymes   . Foot pain    Left; nerve problem; procedure  . GERD (gastroesophageal reflux disease)   . Hepatitis B carrier ([place])    Positive hep B test at [facility]  . Hyperlipidemia    Mild  . Osteoarthritis   . Osteopenia   . Pneumonia 2004   Past Surgical History:  Procedure Laterality Date  . "Blocked kidney"    . BACK SURGERY    . CARDIOVASCULAR STRESS  TEST  9/07   Neg  . CATARACT EXTRACTION W/ INTRAOCULAR LENS IMPLANT Right 05/06/2017   Dr. Tommy Rainwater  . CATARACT EXTRACTION W/ INTRAOCULAR LENS IMPLANT Left 05/20/2017   Dr. Tommy Rainwater  . COLONOSCOPY  9/06   Diverticulosis, hems  . COLONOSCOPY  04/04/2019  . Dexa     Osteopenis 9/01, improved 10/03, decreased BMD 5/06  . PARTIAL HYSTERECTOMY    . UPPER GASTROINTESTINAL ENDOSCOPY  04/04/2019  . US ECHOCARDIOGRAPHY  7/03   Abd-normal  . US RENAL/AORTA  2007   Neg   Social History   Tobacco Use  . Smoking status: Never Smoker  . Smokeless tobacco: Never Used  Vaping Use  . Vaping Use: Never used  Substance Use Topics  . Alcohol use: No    Alcohol/week: 0.0 standard drinks  . Drug use: No    Family History  Problem Relation Age of Onset  . Lung cancer Father   . Cancer Sister        Bladder  . Heart attack Brother   . Leukemia Brother   . Stroke Brother   . Colon cancer Neg Hx   . Colon polyps Neg Hx   . Esophageal cancer Neg Hx   . Rectal cancer Neg Hx   . Stomach cancer Neg Hx    Allergies  Allergen Reactions  . Alendronate Sodium     REACTION: acid reflux  . Miacalcin [Calcitonin ([place])] Other (See Comments)    Pt thinks the spray gave her a staph infection in her nose   . Raloxifene     REACTION: severe muscle pain and weakness   Current Outpatient Medications on File Prior to Visit  Medication Sig Dispense Refill  . Acetaminophen (TYLENOL ARTHRITIS PAIN PO) Take 1 tablet by mouth daily.     Marland Kitchen albuterol (PROVENTIL HFA;VENTOLIN HFA) 108 (90 BASE) MCG/ACT inhaler Inhale 2 puffs into the lungs every 4 (four) hours as needed for wheezing or shortness of breath. 1 Inhaler 3  . aspirin 81 MG tablet Take 81 mg by mouth daily.      . Bepotastine Besilate (BEPREVE) 1.5 % SOLN Place 1 drop into both eyes as needed.    . Cholecalciferol (VITAMIN D-3) 1000 UNITS CAPS Take 2 capsules by mouth daily.    . fish oil-omega-3 fatty acids 1000 MG capsule Take 1 g by mouth 2 (two) times daily.     . Fluticasone-Salmeterol (ADVAIR DISKUS) 250-50 MCG/DOSE AEPB Inhale 1 puff into the lungs every 12 (twelve) hours.     Marland Kitchen glucosamine-chondroitin 500-400 MG tablet Take 1 tablet by mouth daily.    Marland Kitchen levocetirizine (XYZAL) 5 MG tablet Take 5 mg by mouth at bedtime.    . montelukast (SINGULAIR) 10 MG tablet Take 10 mg by mouth daily.      Marland Kitchen nystatin (MYCOSTATIN) 100000 UNIT/ML suspension Take 5 mLs (500,000 Units total) by mouth 3 (three) times daily. Swish and swallow 120 mL 0  . omeprazole (PRILOSEC) 20 MG capsule Take 1 capsule (20 mg total) by mouth 2 (two) times daily before a meal. 180 capsule 3  . Probiotic Product (PROBIOTIC PO) Take 1 tablet by mouth daily.     No current  facility-administered medications on file prior to visit.    Review of Systems  Constitutional: Negative for activity change, appetite change, fatigue, fever and unexpected weight change.  HENT: Negative for congestion, ear pain, rhinorrhea, sinus pressure and sore throat.   Eyes: Negative for pain, redness and visual disturbance.  Respiratory:  Negative for cough, shortness of breath and wheezing.   Cardiovascular: Negative for chest pain and palpitations.  Gastrointestinal: Positive for diarrhea and nausea. Negative for abdominal distention, abdominal pain, anal bleeding, blood in stool, constipation, rectal pain and vomiting.  Endocrine: Negative for polydipsia and polyuria.  Genitourinary: Negative for dysuria, frequency and urgency.  Musculoskeletal: Negative for arthralgias, back pain and myalgias.  Skin: Negative for pallor and rash.  Allergic/Immunologic: Negative for environmental allergies.  Neurological: Negative for dizziness, syncope and headaches.       Occ episode of head pressure (not pain) Usually when she is coming down with something   Hematological: Negative for adenopathy. Does not bruise/bleed easily.  Psychiatric/Behavioral: Negative for decreased concentration and dysphoric mood. The patient is not nervous/anxious.        Objective:   Physical Exam Constitutional:      General: She is not in acute distress.    Appearance: Normal appearance. She is well-developed. She is not ill-appearing or diaphoretic.     Comments: Overweight   HENT:     Head: Normocephalic and atraumatic.     Mouth/Throat:     Mouth: Mucous membranes are moist.     Comments: No signs of thrush noted Eyes:     General: No scleral icterus.    Conjunctiva/sclera: Conjunctivae normal.     Pupils: Pupils are equal, round, and reactive to light.  Neck:     Thyroid: No thyromegaly.     Vascular: No carotid bruit or JVD.  Cardiovascular:     Rate and Rhythm: Normal rate and regular rhythm.       Heart sounds: Normal heart sounds. No gallop.   Pulmonary:     Effort: Pulmonary effort is normal. No respiratory distress.     Breath sounds: Normal breath sounds. No wheezing or rales.  Abdominal:     General: Bowel sounds are normal. There is no distension or abdominal bruit.     Palpations: Abdomen is soft. There is no mass.     Tenderness: There is no abdominal tenderness. There is no guarding or rebound.     Hernia: No hernia is present.  Musculoskeletal:     Cervical back: Normal range of motion and neck supple.     Right lower leg: No edema.     Left lower leg: No edema.  Lymphadenopathy:     Cervical: No cervical adenopathy.  Skin:    General: Skin is warm and dry.     Coloration: Skin is not jaundiced or pale.     Findings: No erythema or rash.  Neurological:     Mental Status: She is alert.     Coordination: Coordination normal.     Deep Tendon Reflexes: Reflexes are normal and symmetric.  Psychiatric:        Mood and Affect: Mood normal.           Assessment & Plan:   Problem List Items Addressed This Visit      Other   Diarrhea - Primary    2nd episode since June (this time was not bloody like previous) Now improved using pepto/immodium (did inst to back off those now since it has slowed down) Seen in UC 9/29 -also had low grade temp then and covid test was negative  Reassuring exam  Reviewed last colonoscopy as well as labs  Ref was made to GI (working on that)  Enc her to eat a balanced diet and continue probiotic  inst to update if diarrhea  returns when she backs off of pepto and immodium        COVID-19 vaccine series declined    Encouraged her to consider vaccination  She is worried about "government controlling her"  Disc safety profile of vaccine in setting of risk of covid She will think about it         Other Visit Diagnoses    Need for influenza vaccination       Relevant Orders   Flu Vaccine QUAD 6+ mos PF IM (Fluarix Quad PF)  (Completed)

## 2019-12-17 ENCOUNTER — Telehealth: Payer: Self-pay

## 2019-12-17 NOTE — Telephone Encounter (Signed)
I doubt the flu shot caused this. She has a h/o venous insufficiency (? Perhaps she was sitting more)  Elevate feet  Use compression hose if helpful  Watch for sob or other symptoms  Keep me posted

## 2019-12-17 NOTE — Telephone Encounter (Signed)
Pt notified of Dr. Tower's comments and recommendations and verbalized understanding  

## 2019-12-17 NOTE — Telephone Encounter (Signed)
The evening after the pt received her flu vaccine she had swelling in her legs and feet. She is having to sit more, also.  She is wondering if the flu shot caused this. I told her I did not think that was the reason but I would get with Dr Glori Bickers for her.   She also asked if she needed to wait a certain amount of time between the covid vaccine and the flu shot. I advised her there was not a time frame.   Please advise at [phone] or [phone].

## 2020-01-16 ENCOUNTER — Other Ambulatory Visit: Payer: Self-pay | Admitting: Family Medicine

## 2020-01-16 DIAGNOSIS — Z1231 Encounter for screening mammogram for malignant neoplasm of breast: Secondary | ICD-10-CM

## 2020-02-06 ENCOUNTER — Other Ambulatory Visit (INDEPENDENT_AMBULATORY_CARE_PROVIDER_SITE_OTHER): Payer: Medicare Other

## 2020-02-06 ENCOUNTER — Encounter: Payer: Self-pay | Admitting: Internal Medicine

## 2020-02-06 ENCOUNTER — Ambulatory Visit: Payer: Medicare Other | Admitting: Internal Medicine

## 2020-02-06 VITALS — BP 120/60 | HR 76 | Ht 60.0 in | Wt 147.0 lb

## 2020-02-06 DIAGNOSIS — R197 Diarrhea, unspecified: Secondary | ICD-10-CM

## 2020-02-06 DIAGNOSIS — R1084 Generalized abdominal pain: Secondary | ICD-10-CM

## 2020-02-06 DIAGNOSIS — R634 Abnormal weight loss: Secondary | ICD-10-CM

## 2020-02-06 LAB — COMPREHENSIVE METABOLIC PANEL
ALT: 22 U/L (ref 0–35)
AST: 22 U/L (ref 0–37)
Albumin: 4.1 g/dL (ref 3.5–5.2)
Alkaline Phosphatase: 96 U/L (ref 39–117)
BUN: 13 mg/dL (ref 6–23)
CO2: 36 mEq/L — ABNORMAL HIGH (ref 19–32)
Calcium: 9.6 mg/dL (ref 8.4–10.5)
Chloride: 101 mEq/L (ref 96–112)
Creatinine, Ser: 0.73 mg/dL (ref 0.40–1.20)
GFR: 75.99 mL/min (ref >60.00)
Glucose, Bld: 97 mg/dL (ref 70–99)
Potassium: 3.8 mEq/L (ref 3.5–5.1)
Sodium: 142 mEq/L (ref 135–145)
Total Bilirubin: 0.7 mg/dL (ref 0.2–1.2)
Total Protein: 7.6 g/dL (ref 6.0–8.3)

## 2020-02-06 LAB — LIPASE: Lipase: 17 U/L (ref 11.0–59.0)

## 2020-02-06 NOTE — Progress Notes (Signed)
Erika Evans 85 y.o. 08-19-1934 [id number]  Assessment & Plan:   Encounter Diagnoses  Name Primary?  . Generalized abdominal pain Yes  . Loss of weight   . 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  Probably nothing sinister here but Erika Evans is elderly and Erika Evans is not all back to normal.  There is been some mild weight loss and Erika Evans has some abdominal pain.  I have decided to pursue a CT scan of the abdomen and pelvis and labs.  Lab Results  Component Value Date   CREATININE 0.73 02/06/2020   BUN 13 02/06/2020   NA 142 02/06/2020   K 3.8 02/06/2020   CL 101 02/06/2020   CO2 36 (H) 02/06/2020   Lab Results  Component Value Date   ALT 22 02/06/2020   AST 22 02/06/2020   ALKPHOS 96 02/06/2020   BILITOT 0.7 02/06/2020   Lab Results  Component Value Date   LIPASE 17.0 02/06/2020    Labs returned essentially normal.  We will see what CT scan shows.  Erika Evans is symptomatically better overall.  Subjective:   Chief Complaint: Diarrhea  HPI This is an 85 year old white woman known to me with a history of epigastric pain recently, and a positive Cologuard, evaluated in January 2021 with a colonoscopy showing a diminutive adenoma internal hemorrhoids and diverticulosis and a normal upper GI endoscopy.  Erika Evans went to the emergency department in the summer with a bloody diarrhea problem.  Erika Evans had heme-negative stools and her CBC was normal.  Erika Evans was treated with antibiotics.  Erika Evans developed recurrent diarrhea and saw Dr. Glori Bickers in October.  Not entirely clear to me but I do not think Erika Evans was bleeding then.  Erika Evans was referred at that point.  In the interim Erika Evans decided to stop taking the glucosamine supplement Erika Evans was taking because it was different than the one Erika Evans had taken for years and Erika Evans thinks hand swelling abdominal pain and diarrhea are all better.  The other weekend Erika Evans did have a flareup of some diarrhea because Erika Evans ate at a [employer].  Erika Evans does  have some food intolerances and Erika Evans believes Erika Evans is lactose intolerant as well.  Overall Erika Evans thinks Erika Evans is much better than Erika Evans was in June and October when Erika Evans had episodes of diarrhea.  Erika Evans is not currently having any bleeding.  Appetite is reasonable not way off.  Wt Readings from Last 3 Encounters:  02/06/20 147 lb (66.7 kg)  12/12/19 149 lb 1 oz (67.6 kg)  08/22/19 153 lb 7 oz (69.6 kg)  68 kg 1/21 66 kg 10/19  Allergies  Allergen Reactions  . Alendronate Sodium     REACTION: acid reflux  . Miacalcin [Calcitonin ([place])] Other (See Comments)    Pt thinks the spray gave her a staph infection in her nose   . Raloxifene     REACTION: severe muscle pain and weakness   Current Meds  Medication Sig  . Acetaminophen (TYLENOL ARTHRITIS PAIN PO) Take 1 tablet by mouth daily.   Marland Kitchen albuterol (PROVENTIL HFA;VENTOLIN HFA) 108 (90 BASE) MCG/ACT inhaler Inhale 2 puffs into the lungs every 4 (four) hours as needed for wheezing or shortness of breath.  Marland Kitchen aspirin 81 MG tablet Take 81 mg by mouth daily.    . Bepotastine Besilate (BEPREVE) 1.5 % SOLN Place 1 drop into both eyes as needed.  . Cholecalciferol (VITAMIN D-3) 1000 UNITS CAPS Take 2 capsules  by mouth daily.  . fish oil-omega-3 fatty acids 1000 MG capsule Take 1 g by mouth 2 (two) times daily.   . Fluticasone-Salmeterol (ADVAIR DISKUS) 250-50 MCG/DOSE AEPB Inhale 1 puff into the lungs every 12 (twelve) hours.   Marland Kitchen levocetirizine (XYZAL) 5 MG tablet Take 5 mg by mouth at bedtime.  . montelukast (SINGULAIR) 10 MG tablet Take 10 mg by mouth daily.    Marland Kitchen omeprazole (PRILOSEC) 20 MG capsule Take 1 capsule (20 mg total) by mouth 2 (two) times daily before a meal.   Past Medical History:  Diagnosis Date  . Cataract 2019   resolved with surgery  . Congenital fusion of cervical spine   . Elevated liver enzymes   . Foot pain    Left; nerve problem; procedure  . GERD (gastroesophageal reflux disease)   . Hepatitis B carrier ([place])    Positive  hep B test at [facility]  . Hyperlipidemia    Mild  . Osteoarthritis   . Osteopenia   . Pneumonia 2004   Past Surgical History:  Procedure Laterality Date  . "Blocked kidney"    . BACK SURGERY    . CARDIOVASCULAR STRESS TEST  9/07   Neg  . CATARACT EXTRACTION W/ INTRAOCULAR LENS IMPLANT Right 05/06/2017   Dr. Tommy Rainwater  . CATARACT EXTRACTION W/ INTRAOCULAR LENS IMPLANT Left 05/20/2017   Dr. Tommy Rainwater  . COLONOSCOPY  9/06   Diverticulosis, hems  . COLONOSCOPY  04/04/2019  . Dexa     Osteopenis 9/01, improved 10/03, decreased BMD 5/06  . PARTIAL HYSTERECTOMY    . UPPER GASTROINTESTINAL ENDOSCOPY  04/04/2019  . US ECHOCARDIOGRAPHY  7/03   Abd-normal  . US RENAL/AORTA  2007   Neg   Social History   Social History Narrative   Lost husband November 2004.   Lives alone   Is a twin (atually triplet but third (boy) died at birth)   Sometimes walks for exercise.   Retired [employer]   No children   Never smoker, No ETOH, rare caffeine   family history includes Cancer in her sister; Heart attack in her brother; Leukemia in her brother; Lung cancer in her father; Stroke in her brother.   Review of Systems As per HPI  Objective:   Physical Exam BP 120/60   Pulse 76   Ht 5' (1.524 m)   Wt 147 lb (66.7 kg)   BMI 28.71 kg/m  Well-appearing elderly white woman no acute distress Appropriate mood and affect Alert and oriented x3 Abdomen benign  Data reviewed as per HPI that includes primary care notes ER visits labs

## 2020-02-06 NOTE — Patient Instructions (Addendum)
You have been scheduled for a CT scan of the abdomen and pelvis at [facility] are scheduled on 02/27/2020 at 11:00AM. You should arrive 30 minutes prior to your appointment time for registration. Please follow the written instructions below on the day of your exam:  WARNING: IF YOU ARE ALLERGIC TO IODINE/X-RAY DYE, PLEASE NOTIFY RADIOLOGY IMMEDIATELY AT [phone]! YOU WILL BE GIVEN A 13 HOUR PREMEDICATION PREP.  1) Do not eat or drink anything after 7:00AM (4 hours prior to your test) 2) You have been given 2 bottles of oral contrast to drink. The solution may taste better if refrigerated, but do NOT add ice or any other liquid to this solution. Shake well before drinking.    Drink 1 bottle of contrast @ 9:00AM (2 hours prior to your exam)  Drink 1 bottle of contrast @ 10:00AM (1 hour prior to your exam)   The purpose of you drinking the oral contrast is to aid in the visualization of your intestinal tract. The contrast solution may cause some diarrhea. Depending on your individual set of symptoms, you may also receive an intravenous injection of x-ray contrast/dye. This test typically takes 30-45 minutes to complete.  If you have any questions regarding your exam or if you need to reschedule, you may call the CT department at [phone] between the hours of 8:00 am and 5:00 pm, Monday-Friday.  ________________________________________________________________________   Your provider has requested that you go to the basement level for lab work before leaving today. Press "B" on the elevator. The lab is located at the first door on the left as you exit the elevator.   Due to recent changes in healthcare laws, you may see the results of your imaging and laboratory studies on MyChart before your provider has had a chance to review them.  We understand that in some cases there may be results that are confusing or concerning to you. Not all laboratory results come back in  the same time frame and the provider may be waiting for multiple results in order to interpret others.  Please give [place] 48 hours in order for your provider to thoroughly review all the results before contacting the office for clarification of your results.   I appreciate the opportunity to care for you. Silvano Rusk, MD, [facility]

## 2020-02-27 ENCOUNTER — Other Ambulatory Visit: Payer: Self-pay

## 2020-02-27 ENCOUNTER — Ambulatory Visit
Admission: RE | Admit: 2020-02-27 | Discharge: 2020-02-27 | Disposition: A | Discharge status: Home / Self Care | Payer: Medicare Other | Source: Ambulatory Visit | Attending: Internal Medicine | Admitting: Internal Medicine

## 2020-02-27 ENCOUNTER — Ambulatory Visit
Admission: RE | Admit: 2020-02-27 | Discharge: 2020-02-27 | Disposition: A | Discharge status: Home / Self Care | Payer: Medicare Other | Source: Ambulatory Visit | Attending: Family Medicine | Admitting: Family Medicine

## 2020-02-27 DIAGNOSIS — Z1231 Encounter for screening mammogram for malignant neoplasm of breast: Secondary | ICD-10-CM

## 2020-02-27 DIAGNOSIS — R1084 Generalized abdominal pain: Secondary | ICD-10-CM | POA: Diagnosis present

## 2020-02-27 DIAGNOSIS — R197 Diarrhea, unspecified: Secondary | ICD-10-CM | POA: Insufficient documentation

## 2020-02-27 DIAGNOSIS — R634 Abnormal weight loss: Secondary | ICD-10-CM | POA: Insufficient documentation

## 2020-02-27 HISTORY — DX: Unspecified asthma, uncomplicated: J45.909

## 2020-02-27 MED ORDER — IOHEXOL 300 MG/ML  SOLN
100.0000 mL | Freq: Once | INTRAMUSCULAR | Status: AC | PRN
  Administered 2020-02-27: 100 mL via INTRAVENOUS

## 2020-03-03 ENCOUNTER — Other Ambulatory Visit: Payer: Self-pay

## 2020-03-03 ENCOUNTER — Other Ambulatory Visit: Payer: Medicare Other

## 2020-03-03 ENCOUNTER — Telehealth (INDEPENDENT_AMBULATORY_CARE_PROVIDER_SITE_OTHER): Payer: Medicare Other | Admitting: Family Medicine

## 2020-03-03 ENCOUNTER — Other Ambulatory Visit (INDEPENDENT_AMBULATORY_CARE_PROVIDER_SITE_OTHER): Payer: Medicare Other | Admitting: Family Medicine

## 2020-03-03 ENCOUNTER — Encounter: Payer: Self-pay | Admitting: Family Medicine

## 2020-03-03 VITALS — Ht 60.0 in | Wt 141.4 lb

## 2020-03-03 DIAGNOSIS — M791 Myalgia, unspecified site: Secondary | ICD-10-CM

## 2020-03-03 DIAGNOSIS — R058 Other specified cough: Secondary | ICD-10-CM

## 2020-03-03 DIAGNOSIS — R509 Fever, unspecified: Secondary | ICD-10-CM | POA: Diagnosis not present

## 2020-03-03 DIAGNOSIS — U071 COVID-19: Secondary | ICD-10-CM

## 2020-03-03 LAB — POC INFLUENZA A&B (BINAX/QUICKVUE)
Influenza A, POC: NEGATIVE
Influenza B, POC: NEGATIVE

## 2020-03-03 MED ORDER — DOXYCYCLINE HYCLATE 100 MG PO TABS
100.0000 mg | ORAL_TABLET | Freq: Two times a day (BID) | ORAL | 0 refills | Status: AC
Start: 2020-03-03 — End: 2020-03-13

## 2020-03-03 NOTE — Progress Notes (Addendum)
[name] T. Johnye Kist, MD Primary Care and [place] at [facility] [place], [address] Phone: [phone]  FAX: [name]  MRN [id number]  Date of Birth: May 05, 1934  Visit Date: 03/03/2020  PCP: Abner Greenspan, MD  Referred by: Tower, Wynelle Fanny, MD Chief Complaint  Patient presents with  . Cough  . Nasal Congestion  . Headache  . Fever   Virtual Visit via Video Note:  I connected with  Erika Evans on 03/03/2020 12:00 PM EST by a video enabled telemedicine application and verified that I am speaking with the correct person using two identifiers.   Location patient: home computer, tablet, or smartphone Location provider: work or home office Consent: Verbal consent directly obtained from Erika Evans. Persons participating in the virtual visit: patient, provider  I discussed the limitations of evaluation and management by telemedicine and the availability of in person appointments. The patient expressed understanding and agreed to proceed.  Interactive audio and video telecommunications were attempted between this provider and patient, however failed, due to patient having technical difficulties OR patient did not have access to video capability.  We continued and completed visit with audio only.   History of Present Illness:  For several days before Christmas, she started to develop a cough.  Then head started to hurt, coughing, headache.  Feels like she has the flu.  Was having some myalgia and arthralgia.  Feels ok generally.  Sweats a lot last night.  After cough feeling better, 102.2 maximum fever.  Then the next day dropped to 101.    Around multiple family members.  Neighbor was sick.  No n/v/d.  She is taking some Advair.  She is plus or minus compliant.  Has had the first 2 vaccines.   Not wheezing at all.  No wheezing.   Immunization History   Administered Date(s) Administered  . Fluad Quad(high Dose 65+) 12/12/2018  . Influenza Split 12/10/2010, 12/03/2011  . Influenza, High Dose Seasonal PF 12/13/2016  . Influenza,inj,Quad PF,6+ Mos 11/27/2012, 12/11/2013, 01/01/2015, 12/12/2015, 12/14/2017, 12/12/2019  . Pneumococcal Conjugate-13 01/28/2015  . Pneumococcal Polysaccharide-23 04/21/2011  . Td 04/21/2011  . Zoster 12/13/2013     Review of Systems as above: See pertinent positives and pertinent negatives per HPI No acute distress verbally   Observations/Objective/Exam:  An attempt was made to discern vital signs over the phone and per patient if applicable and possible.   General:    Alert, Oriented, appears well and in no acute distress  Pulmonary:     On inspection no signs of respiratory distress.  Psych / Neurological:     Pleasant and cooperative.  Assessment and Plan:    ICD-10-CM   1. Fever and chills  R50.9   2. Productive cough  R05.8   3. Myalgia  M79.10    Total encounter time: 20 minutes. This includes total time spent on the day of encounter.  This includes phone conversation as well as chart review.  Multiple symptoms with a T-max of 102.  She did have some diffuse myalgia or arthralgia.  I did have the patient come into our office to be checked for COVID-19 as well as influenza.  Addendum: 03/06/20 8:17 AM Covid-19 test is positive I will try to arrange monoclonal antibody infusion.  Unsure if this will happen given massive shortage, but with age we will try.  Further treatment will be dictated by  these test.  If both are negative then I am going to place him on some antibiotics and have her use her albuterol as needed  I discussed the assessment and treatment plan with the patient. The patient was provided an opportunity to ask questions and all were answered. The patient agreed with the plan and demonstrated an understanding of the instructions.   The patient was advised to call back or seek  an in-person evaluation if the symptoms worsen or if the condition fails to improve as anticipated.  Follow-up: prn unless noted otherwise below No follow-ups on file.  Meds ordered this encounter  Medications  . doxycycline (VIBRA-TABS) 100 MG tablet    Sig: Take 1 tablet (100 mg total) by mouth 2 (two) times daily for 10 days.    Dispense:  20 tablet    Refill:  0   No orders of the defined types were placed in this encounter.   Signed,  Elpidio Galea. Marcelis Wissner, MD

## 2020-03-03 NOTE — Progress Notes (Signed)
fev

## 2020-03-03 NOTE — Progress Notes (Signed)
Appointment scheduled today at 3:30 for Covid & Flu test.

## 2020-03-06 LAB — SARS-COV-2, NAA 2 DAY TAT

## 2020-03-06 LAB — NOVEL CORONAVIRUS, NAA: SARS-CoV-2, NAA: DETECTED — AB

## 2020-05-14 ENCOUNTER — Other Ambulatory Visit: Payer: Self-pay | Admitting: Family Medicine

## 2020-08-26 ENCOUNTER — Telehealth: Payer: Self-pay

## 2020-08-26 NOTE — Telephone Encounter (Signed)
Received call from Marnette Burgess NP at [facility] for patient. She will be in her office until 8 tonight. Wanted to follow up on patient and let know that she was seen in office today and tested negative for flu and covid. Was given treatment for Possible UTI. They have sent out urine culture, cbc and cmp. Did not know if you wanted our office to call and follow up with patient. She states that her note should be in epic for you to view.

## 2020-08-26 NOTE — Telephone Encounter (Signed)
I appreciate the heads up  Please call pt in am and see how she is feeling

## 2020-10-24 ENCOUNTER — Telehealth: Payer: Self-pay

## 2020-10-24 ENCOUNTER — Emergency Department: Payer: Medicare Other

## 2020-10-24 ENCOUNTER — Other Ambulatory Visit: Payer: Self-pay

## 2020-10-24 DIAGNOSIS — Z20822 Contact with and (suspected) exposure to covid-19: Secondary | ICD-10-CM | POA: Insufficient documentation

## 2020-10-24 DIAGNOSIS — J4541 Moderate persistent asthma with (acute) exacerbation: Secondary | ICD-10-CM | POA: Diagnosis not present

## 2020-10-24 DIAGNOSIS — J209 Acute bronchitis, unspecified: Secondary | ICD-10-CM | POA: Diagnosis not present

## 2020-10-24 DIAGNOSIS — R0602 Shortness of breath: Secondary | ICD-10-CM | POA: Diagnosis present

## 2020-10-24 DIAGNOSIS — Z79899 Other long term (current) drug therapy: Secondary | ICD-10-CM | POA: Insufficient documentation

## 2020-10-24 DIAGNOSIS — Z7982 Long term (current) use of aspirin: Secondary | ICD-10-CM | POA: Insufficient documentation

## 2020-10-24 LAB — RESP PANEL BY RT-PCR (FLU A&B, COVID) ARPGX2
Influenza A by PCR: NEGATIVE
Influenza B by PCR: NEGATIVE
SARS Coronavirus 2 by RT PCR: NEGATIVE

## 2020-10-24 LAB — CBC
HCT: 38.7 % (ref 36.0–46.0)
Hemoglobin: 12.9 g/dL (ref 12.0–15.0)
MCH: 31.3 pg (ref 26.0–34.0)
MCHC: 33.3 g/dL (ref 30.0–36.0)
MCV: 93.9 fL (ref 80.0–100.0)
Platelets: 226 K/uL (ref 150–400)
RBC: 4.12 MIL/uL (ref 3.87–5.11)
RDW: 12.9 % (ref 11.5–15.5)
WBC: 10.1 K/uL (ref 4.0–10.5)
nRBC: 0 % (ref 0.0–0.2)

## 2020-10-24 LAB — BASIC METABOLIC PANEL WITH GFR
Anion gap: 9 (ref 5–15)
BUN: 16 mg/dL (ref 8–23)
CO2: 29 mmol/L (ref 22–32)
Calcium: 9.4 mg/dL (ref 8.9–10.3)
Chloride: 100 mmol/L (ref 98–111)
Creatinine, Ser: 0.76 mg/dL (ref 0.44–1.00)
GFR, Estimated: >60 mL/min
Glucose, Bld: 146 mg/dL — ABNORMAL HIGH (ref 70–99)
Potassium: 3.8 mmol/L (ref 3.5–5.1)
Sodium: 138 mmol/L (ref 135–145)

## 2020-10-24 LAB — TROPONIN I (HIGH SENSITIVITY): Troponin I (High Sensitivity): 8 ng/L

## 2020-10-24 NOTE — ED Triage Notes (Signed)
Sent by [facility] and was told to come to ed for bilateral pneumonia and low oxygen with walking. Went d/t shob, increased phlegm, headache x2-3wks. Denies being tested for covid (last had fall last year, vaccines x2). Able to speak in complete sentences.

## 2020-10-24 NOTE — ED Notes (Signed)
Pt to Xray via Wheelchair

## 2020-10-24 NOTE — Telephone Encounter (Deleted)
[facility] - Client TELEPHONE ADVICE RECORD [facility] Patient Name: Erika Evans Gender: Female DOB: 11/21/1937 Age: 86 Y 11 M 4 D Return Phone Number: [phone] (Primary) Address: City/ State/ Zip: [place] [address] Client [facility] - Client Client Site [place] - Day Physician Renford Dills - MD Contact Type Call Who Is Calling Patient / Member / Family / Caregiver Call Type Triage / Clinical Relationship To Patient Self Return Phone Number [phone] (Primary) Chief Complaint Skin Lesion - Moles/ Lumps/ Growths Reason for Call Symptomatic / Request for [place] is being transferred for triage and she states that Dr. Damita Dunnings is booked until the 25th. The patient is complaining of a bump that showed up on her arm and she is concerned. She said she would like a voicemail if she doesn't answer because her phone sometimes blocks calls. Transferred to non urgent queue. Translation No Nurse Assessment Nurse: Ysidro Evert, RN, Levada Dy Date/Time (Eastern Time): 10/24/2020 2:22:13 PM Confirm and document reason for call. If symptomatic, describe symptoms. ---Caller states she has a bump on her left wrist that came up last night. No other symptoms Does the patient have any new or worsening symptoms? ---Yes Will a triage be completed? ---Yes Related visit to physician within the last 2 weeks? ---No Does the PT have any chronic conditions? (i.e. diabetes, asthma, this includes High risk factors for pregnancy, etc.) ---Yes List chronic conditions. ---chf, hypertension, diabetes, cholesterol Is this a behavioral health or substance abuse call? ---No Guidelines Guideline Title Affirmed Question Affirmed Notes Nurse Date/Time [name] Time) Wrist Swelling Small firm or hard BALL OR LUMP Ysidro Evert, RN, Levada Dy 10/24/2020 2:24:56 PM Disp. Time  [name] Time) Disposition Final User 10/24/2020 2:28:23 PM See PCP within 2 Weeks Yes Ysidro Evert, RN, Levada Dy PLEASE NOTE: All timestamps contained within this report are represented as [place] Standard Time. CONFIDENTIALTY NOTICE: This fax transmission is intended only for the addressee. It contains information that is legally privileged, confidential or otherwise protected from use or disclosure. If you are not the intended recipient, you are strictly prohibited from reviewing, disclosing, copying using or disseminating any of this information or taking any action in reliance on or regarding this information. If you have received this fax in error, please notify us immediately by telephone so that we can arrange for its return to [place]. Phone: [phone], Toll-Free: [phone], Fax: [phone] Page: 2 of 2 Call Id: [id number] [place] Disagree/Comply Comply Caller Understands Yes PreDisposition Did not know what to do Care Advice Given Per Guideline SEE PCP WITHIN 2 WEEKS: * You need to be seen for this ongoing problem within the next 2 weeks. CALL BACK IF: * Fever occurs * Wrist becomes red * You become worse CARE ADVICE given per Wrist Swelling (Adult) guideline. Referrals REFERRED TO PCP OFFICE

## 2020-10-24 NOTE — Telephone Encounter (Signed)
Agree with advisement for UC or ER.  I am currently out of the office.  Sounds like she needs in person care which we cannot provide.

## 2020-10-24 NOTE — Telephone Encounter (Signed)
Unable to reach pt by phone and I spoke with [name] (DPR signed) and [name] said that pt did go to an UC in [place], [place] thinks it was [facility] in [place]. Sending note to DR Glori Bickers and [place] CMA as Juluis Rainier.

## 2020-10-24 NOTE — Telephone Encounter (Signed)
[facility] - Client TELEPHONE ADVICE RECORD [facility] Patient Name: Erika Evans Gender: Female DOB: 1935/01/04 Age: 86 Y 3 M 2 D Return Phone Number: [phone] (Primary), [phone] (Secondary) Address: [place] [address] [address] [facility] - Client Client Site [place] MD Contact Type Call Who Is Calling Patient / Member / Family / Caregiver Call Type Triage / Clinical Relationship To Patient Self Return Phone Number [phone] (Primary) Chief Complaint Headache Reason for Call Symptomatic / Request for [place] states has fever 100 and head feels tight, and clearing throat is hard. [place] Not Listed [facility] [place] Translation No Nurse Assessment Nurse: Della Goo, RN, Vicente Males Date/Time (Eastern Time): 10/24/2020 2:15:52 PM Confirm and document reason for call. If symptomatic, describe symptoms. ---Caller states she has a fever and HA and cough. She is taking advair which has caused a lot of coughing up. Temp 100.0 (oral). Does the patient have any new or worsening symptoms? ---Yes Will a triage be completed? ---Yes Related visit to physician within the last 2 weeks? ---No Does the PT have any chronic conditions? (i.e. diabetes, asthma, this includes High risk factors for pregnancy, etc.) ---Yes List chronic conditions. ---asthma Is this a behavioral health or substance abuse call? ---No Guidelines Guideline Title Affirmed Question Affirmed Notes Nurse Date/Time ([place] Time) COVID-19 - Diagnosed or Suspected MODERATE difficulty breathing (e.g., speaks in phrases, SOB even at rest, pulse 100-120) Quandt, RN, Vicente Males 10/24/2020 2:18:38 PM Disp. Time [name] Time) Disposition Final User 10/24/2020 2:30:12 PM Go to ED Now Yes Quandt, RN, Debera Lat NOTE: All  timestamps contained within this report are represented as [place] Standard Time. CONFIDENTIALTY NOTICE: This fax transmission is intended only for the addressee. It contains information that is legally privileged, confidential or otherwise protected from use or disclosure. If you are not the intended recipient, you are strictly prohibited from reviewing, disclosing, copying using or disseminating any of this information or taking any action in reliance on or regarding this information. If you have received this fax in error, please notify us immediately by telephone so that we can arrange for its return to [place]. Phone: [phone], Toll-Free: [phone], Fax: [phone] Page: 2 of 2 Call Id: [id number] [place] Disagree/Comply Disagree Caller Understands Yes PreDisposition InappropriateToAsk Care Advice Given Per Guideline GO TO ED NOW: CARE ADVICE given per COVID-19 - DIAGNOSED OR SUSPECTED (Adult) guideline. ANOTHER ADULT SHOULD DRIVE: * It is better and safer if another adult drives instead of you. CALL EMS 911 IF: * Severe difficulty breathing occurs * Lips or face turns blue * Confusion occurs. Comments User: Juanda Crumble, RN Date/Time [name] Time): 10/24/2020 2:30:57 PM caller refusing ED, states there aren't any close to her. Backlined office, no appts available. Referred pt to UC in [place], caller agreeable. Referrals GO TO FACILITY OTHER - SPECIFY

## 2020-10-24 NOTE — Telephone Encounter (Signed)
Please see note below access nurse when spoke with pts sister.

## 2020-10-25 ENCOUNTER — Encounter: Payer: Self-pay | Admitting: Internal Medicine

## 2020-10-25 ENCOUNTER — Observation Stay
Admission: EM | Admit: 2020-10-25 | Discharge: 2020-10-25 | Disposition: A | Discharge status: Home / Self Care | Payer: Medicare Other | Attending: Internal Medicine | Admitting: Internal Medicine

## 2020-10-25 ENCOUNTER — Other Ambulatory Visit: Payer: Self-pay

## 2020-10-25 ENCOUNTER — Emergency Department: Payer: Medicare Other

## 2020-10-25 DIAGNOSIS — J209 Acute bronchitis, unspecified: Secondary | ICD-10-CM | POA: Diagnosis not present

## 2020-10-25 DIAGNOSIS — J9601 Acute respiratory failure with hypoxia: Secondary | ICD-10-CM | POA: Insufficient documentation

## 2020-10-25 DIAGNOSIS — J189 Pneumonia, unspecified organism: Secondary | ICD-10-CM

## 2020-10-25 DIAGNOSIS — R0602 Shortness of breath: Secondary | ICD-10-CM

## 2020-10-25 DIAGNOSIS — J4541 Moderate persistent asthma with (acute) exacerbation: Secondary | ICD-10-CM

## 2020-10-25 LAB — RESPIRATORY PANEL BY PCR

## 2020-10-25 LAB — CBC
HCT: 39 % (ref 36.0–46.0)
Hemoglobin: 13 g/dL (ref 12.0–15.0)
MCH: 31.4 pg (ref 26.0–34.0)
MCHC: 33.3 g/dL (ref 30.0–36.0)
MCV: 94.2 fL (ref 80.0–100.0)
Platelets: 235 10*3/uL (ref 150–400)
RBC: 4.14 MIL/uL (ref 3.87–5.11)
RDW: 12.9 % (ref 11.5–15.5)
WBC: 9.1 10*3/uL (ref 4.0–10.5)
nRBC: 0 % (ref 0.0–0.2)

## 2020-10-25 LAB — PROCALCITONIN: Procalcitonin: <0.1 ng/mL

## 2020-10-25 LAB — D-DIMER, QUANTITATIVE: D-Dimer, Quant: 1.61 ug/mL-FEU — ABNORMAL HIGH (ref 0.00–0.50)

## 2020-10-25 LAB — CREATININE, SERUM
Creatinine, Ser: 0.65 mg/dL (ref 0.44–1.00)
GFR, Estimated: >60 mL/min (ref >60)

## 2020-10-25 LAB — TROPONIN I (HIGH SENSITIVITY): Troponin I (High Sensitivity): 8 ng/L (ref <18)

## 2020-10-25 MED ORDER — ONDANSETRON HCL 4 MG PO TABS: 4.0000 mg | ORAL_TABLET | Freq: Four times a day (QID) | ORAL | Status: DC | PRN

## 2020-10-25 MED ORDER — AZITHROMYCIN 500 MG PO TABS
500.0000 mg | ORAL_TABLET | Freq: Every day | ORAL | Status: DC
  Administered 2020-10-25: 500 mg via ORAL
  Filled 2020-10-25: qty 1

## 2020-10-25 MED ORDER — IPRATROPIUM-ALBUTEROL 0.5-2.5 (3) MG/3ML IN SOLN: 3.0000 mL | RESPIRATORY_TRACT | Status: DC | PRN

## 2020-10-25 MED ORDER — ACETAMINOPHEN 325 MG PO TABS: 650.0000 mg | ORAL_TABLET | ORAL | Status: AC | PRN

## 2020-10-25 MED ORDER — AMOXICILLIN 875 MG PO TABS: 875.0000 mg | ORAL_TABLET | Freq: Two times a day (BID) | ORAL | 0 refills | Status: AC

## 2020-10-25 MED ORDER — METHYLPREDNISOLONE SODIUM SUCC 125 MG IJ SOLR
80.0000 mg | Freq: Once | INTRAMUSCULAR | Status: AC
  Administered 2020-10-25: 80 mg via INTRAVENOUS
  Filled 2020-10-25: qty 2

## 2020-10-25 MED ORDER — ACETAMINOPHEN 650 MG RE SUPP: 650.0000 mg | Freq: Four times a day (QID) | RECTAL | Status: DC | PRN

## 2020-10-25 MED ORDER — DOXYCYCLINE HYCLATE 100 MG PO TABS: 100.0000 mg | ORAL_TABLET | Freq: Two times a day (BID) | ORAL | 0 refills | Status: AC

## 2020-10-25 MED ORDER — ENOXAPARIN SODIUM 40 MG/0.4ML IJ SOSY
40.0000 mg | PREFILLED_SYRINGE | INTRAMUSCULAR | Status: DC
  Administered 2020-10-25: 40 mg via SUBCUTANEOUS
  Filled 2020-10-25: qty 0.4

## 2020-10-25 MED ORDER — ONDANSETRON HCL 4 MG/2ML IJ SOLN: 4.0000 mg | Freq: Four times a day (QID) | INTRAMUSCULAR | Status: DC | PRN

## 2020-10-25 MED ORDER — IOHEXOL 350 MG/ML SOLN
75.0000 mL | Freq: Once | INTRAVENOUS | Status: AC | PRN
  Administered 2020-10-25: 75 mL via INTRAVENOUS

## 2020-10-25 MED ORDER — PREDNISONE 20 MG PO TABS
40.0000 mg | ORAL_TABLET | Freq: Every day | ORAL | Status: DC
  Administered 2020-10-25: 14:00:00 40 mg via ORAL
  Filled 2020-10-25: qty 2

## 2020-10-25 MED ORDER — SODIUM CHLORIDE 0.9 % IV BOLUS
1000.0000 mL | Freq: Once | INTRAVENOUS | Status: AC
  Administered 2020-10-25: 1000 mL via INTRAVENOUS

## 2020-10-25 MED ORDER — SODIUM CHLORIDE 0.9 % IV SOLN
1.0000 g | INTRAVENOUS | Status: DC
  Administered 2020-10-25: 1 g via INTRAVENOUS
  Filled 2020-10-25 (×3): qty 10

## 2020-10-25 MED ORDER — ACETAMINOPHEN 325 MG PO TABS: 650.0000 mg | ORAL_TABLET | Freq: Four times a day (QID) | ORAL | Status: DC | PRN

## 2020-10-25 MED ORDER — GUAIFENESIN ER 600 MG PO TB12
600.0000 mg | ORAL_TABLET | Freq: Two times a day (BID) | ORAL | Status: DC
  Administered 2020-10-25: 10:00:00 600 mg via ORAL
  Filled 2020-10-25: qty 1

## 2020-10-25 MED ORDER — DOXYCYCLINE HYCLATE 100 MG PO TABS: 100.0000 mg | ORAL_TABLET | Freq: Two times a day (BID) | ORAL | Status: DC

## 2020-10-25 MED ORDER — PREDNISONE 20 MG PO TABS: 40.0000 mg | ORAL_TABLET | Freq: Every day | ORAL | 0 refills | Status: AC

## 2020-10-25 MED ORDER — IPRATROPIUM-ALBUTEROL 0.5-2.5 (3) MG/3ML IN SOLN
3.0000 mL | Freq: Once | RESPIRATORY_TRACT | Status: AC
  Administered 2020-10-25: 3 mL via RESPIRATORY_TRACT
  Filled 2020-10-25: qty 3

## 2020-10-25 NOTE — ED Notes (Signed)
Pt. Placed on cont. Cardiac monitoring. Warm blankets provided. IV start.

## 2020-10-25 NOTE — Discharge Summary (Signed)
Physician Discharge Summary   Erika Evans [id number] DOB: 1934-05-05 DOA: 10/25/2020  PCP: Abner Greenspan, MD  Admit date: 10/25/2020 Discharge date:  10/25/2020  Admitted From: home Disposition:  home Discharging physician: Dwyane Dee, MD  Recommendations for Outpatient Follow-up:  Continue routine care  Home Health:  Equipment/Devices:   Patient discharged to home in Discharge Condition: stable Risk of unplanned readmission score:    CODE STATUS: Full Diet recommendation:  Diet Orders (From admission, onward)     Start     Ordered   10/25/20 0354  Diet regular Room service appropriate? Yes; Fluid consistency: Thin  Diet effective now       Question Answer Comment  Room service appropriate? Yes   Fluid consistency: Thin      10/25/20 0355   10/25/20 0000  Diet general        10/25/20 1704            Hospital Course:  Ms. Virk is an 86 year old female with PMH asthma who presented to the hospital with worsening cough, shortness of breath, and malaise.  She was initially evaluated in urgent care. She had not been on any antibiotics prior to hospitalization. She underwent work-up including CXR and CTA chest.  CTA showed bilateral interstitial opacities concerning for possible atypical pneumonia. Given her underlying history of asthma, she was presumed to have probable superimposed bacterial bronchitis versus atypical CAP.  She also required oxygen on admission which was able to be quickly weaned off prior to discharge.  She also did not desaturate during a walk test prior to discharge. She was discharged with amoxicillin, doxycycline, and prednisone course to complete at discharge.   The patient's chronic medical conditions were treated accordingly per the patient's home medication regimen except as noted.  On day of discharge, patient was felt deemed stable for discharge. Patient/family member advised to call PCP or come back to ER if needed.    Principal Diagnosis: Acute bronchitis  Discharge Diagnoses: Active Hospital Problems   Diagnosis Date Noted   Acute bronchitis 03/05/2013    Priority: High   Community acquired pneumonia 10/25/2020    Resolved Hospital Problems   Diagnosis Date Noted Date Resolved   Acute respiratory failure with hypoxia [facility]) 10/25/2020 10/25/2020    Discharge Instructions     Diet general   Complete by: As directed    Increase activity slowly   Complete by: As directed       Allergies as of 10/25/2020       Reactions   Alendronate Sodium    REACTION: acid reflux   Miacalcin [calcitonin (salmon)] Other (See Comments)   Pt thinks the spray gave her a staph infection in her nose    Raloxifene    REACTION: severe muscle pain and weakness        Medication List     STOP taking these medications    omeprazole 20 MG capsule Commonly known as: PRILOSEC   TYLENOL ARTHRITIS PAIN PO Replaced by: acetaminophen 325 MG tablet       TAKE these medications    acetaminophen 325 MG tablet Commonly known as: TYLENOL Take 2 tablets (650 mg total) by mouth every 4 (four) hours as needed for mild pain or moderate pain (or Fever >/= 101). Replaces: TYLENOL ARTHRITIS PAIN PO   Advair Diskus 250-50 MCG/DOSE Aepb Generic drug: Fluticasone-Salmeterol Inhale 1 puff into the lungs every 12 (twelve) hours.   albuterol 108 (90 Base) MCG/ACT inhaler Commonly known as: VENTOLIN  HFA Inhale 2 puffs into the lungs every 4 (four) hours as needed for wheezing or shortness of breath.   amoxicillin 875 MG tablet Commonly known as: AMOXIL Take 1 tablet (875 mg total) by mouth 2 (two) times daily for 5 days.   aspirin 81 MG tablet Take 81 mg by mouth daily.   Bepreve 1.5 % Soln Generic drug: Bepotastine Besilate Place 1 drop into both eyes as needed.   doxycycline 100 MG tablet Commonly known as: VIBRA-TABS Take 1 tablet (100 mg total) by mouth every 12 (twelve) hours for 5 days.   fish  oil-omega-3 fatty acids 1000 MG capsule Take 1 g by mouth 2 (two) times daily.   levocetirizine 5 MG tablet Commonly known as: XYZAL Take 5 mg by mouth at bedtime.   montelukast 10 MG tablet Commonly known as: SINGULAIR Take 10 mg by mouth daily.   predniSONE 20 MG tablet Commonly known as: DELTASONE Take 2 tablets (40 mg total) by mouth daily with breakfast for 5 days. Start taking on: October 26, 2020   Vitamin D-3 25 MCG (1000 UT) Caps Take 2 capsules by mouth daily.        Allergies  Allergen Reactions   Alendronate Sodium     REACTION: acid reflux   Miacalcin [Calcitonin (Salmon)] Other (See Comments)    Pt thinks the spray gave her a staph infection in her nose    Raloxifene     REACTION: severe muscle pain and weakness    Consultations:   Discharge Exam: BP 120/77 (BP Location: Right Arm)   Pulse 72   Temp 98.2 F (36.8 C) (Oral)   Resp 16   Ht 5' (1.524 m)   Wt 66 kg   SpO2 98%   BMI 28.44 kg/m  General appearance: alert, cooperative, and no distress Head: Normocephalic, without obvious abnormality, atraumatic Eyes:  EOMI Lungs:  Bibasilar rhonchi, no wheezing Heart: regular rate and rhythm and S1, S2 normal Abdomen: normal findings: bowel sounds normal and soft, non-tender Extremities:  No edema Skin: mobility and turgor normal Neurologic: Grossly normal  The results of significant diagnostics from this hospitalization (including imaging, microbiology, ancillary and laboratory) are listed below for reference.   Microbiology: Recent Results (from the past 240 hour(s))  Blood culture (single)     Status: None (Preliminary result)   Collection Time: 10/24/20  8:34 PM   Specimen: BLOOD  Result Value Ref Range Status   Specimen Description BLOOD LEFT ANTECUBITAL  Final   Special Requests   Final    BOTTLES DRAWN AEROBIC AND ANAEROBIC Blood Culture adequate volume   Culture   Final    NO GROWTH < 12 HOURS Performed at [facility], [address], [place], [place] [address]    Report Status PENDING  Incomplete  Resp Panel by RT-PCR (Flu A&B, Covid) Nasopharyngeal Swab     Status: None   Collection Time: 10/24/20  8:34 PM   Specimen: Nasopharyngeal Swab; Nasopharyngeal(NP) swabs in vial transport medium  Result Value Ref Range Status   SARS Coronavirus 2 by RT PCR NEGATIVE NEGATIVE Final    Comment: (NOTE) SARS-CoV-2 target nucleic acids are NOT DETECTED.  The SARS-CoV-2 RNA is generally detectable in upper respiratory specimens during the acute phase of infection. The lowest concentration of SARS-CoV-2 viral copies this assay can detect is 138 copies/mL. A negative result does not preclude SARS-Cov-2 infection and should not be used as the sole basis for treatment or other patient management decisions.  A negative result may occur with  improper specimen collection/handling, submission of specimen other than nasopharyngeal swab, presence of viral mutation(s) within the areas targeted by this assay, and inadequate number of viral copies(<138 copies/mL). A negative result must be combined with clinical observations, patient history, and epidemiological information. The expected result is Negative.  Fact Sheet for Patients:  EntrepreneurPulse.com.au  Fact Sheet for Healthcare Providers:  IncredibleEmployment.be  This test is no t yet approved or cleared by the [place] FDA and  has been authorized for detection and/or diagnosis of SARS-CoV-2 by FDA under an Emergency Use Authorization (EUA). This EUA will remain  in effect (meaning this test can be used) for the duration of the COVID-19 declaration under Section 564(b)(1) of the Act, 21 U.S.C.section 360bbb-3(b)(1), unless the authorization is terminated  or revoked sooner.       Influenza A by PCR NEGATIVE NEGATIVE Final   Influenza B by PCR NEGATIVE NEGATIVE Final    Comment: (NOTE) The Xpert Xpress SARS-CoV-2/FLU/RSV  plus assay is intended as an aid in the diagnosis of influenza from Nasopharyngeal swab specimens and should not be used as a sole basis for treatment. Nasal washings and aspirates are unacceptable for Xpert Xpress SARS-CoV-2/FLU/RSV testing.  Fact Sheet for Patients: EntrepreneurPulse.com.au  Fact Sheet for Healthcare Providers: IncredibleEmployment.be  This test is not yet approved or cleared by the [place] FDA and has been authorized for detection and/or diagnosis of SARS-CoV-2 by FDA under an Emergency Use Authorization (EUA). This EUA will remain in effect (meaning this test can be used) for the duration of the COVID-19 declaration under Section 564(b)(1) of the Act, 21 U.S.C. section 360bbb-3(b)(1), unless the authorization is terminated or revoked.  Performed at [facility], [place], [place] [address]   Respiratory (~20 pathogens) panel by PCR     Status: None   Collection Time: 10/25/20  9:45 AM   Specimen: Nasopharyngeal Swab; Respiratory  Result Value Ref Range Status   Adenovirus NOT DETECTED NOT DETECTED Final   Coronavirus 229E NOT DETECTED NOT DETECTED Final    Comment: (NOTE) The Coronavirus on the Respiratory Panel, DOES NOT test for the novel  Coronavirus (2019 nCoV)    Coronavirus HKU1 NOT DETECTED NOT DETECTED Final   Coronavirus NL63 NOT DETECTED NOT DETECTED Final   Coronavirus OC43 NOT DETECTED NOT DETECTED Final   Metapneumovirus NOT DETECTED NOT DETECTED Final   Rhinovirus / Enterovirus NOT DETECTED NOT DETECTED Final   Influenza A NOT DETECTED NOT DETECTED Final   Influenza B NOT DETECTED NOT DETECTED Final   Parainfluenza Virus 1 NOT DETECTED NOT DETECTED Final   Parainfluenza Virus 2 NOT DETECTED NOT DETECTED Final   Parainfluenza Virus 3 NOT DETECTED NOT DETECTED Final   Parainfluenza Virus 4 NOT DETECTED NOT DETECTED Final   Respiratory Syncytial Virus NOT DETECTED NOT DETECTED  Final   Bordetella pertussis NOT DETECTED NOT DETECTED Final   Bordetella Parapertussis NOT DETECTED NOT DETECTED Final   Chlamydophila pneumoniae NOT DETECTED NOT DETECTED Final   Mycoplasma pneumoniae NOT DETECTED NOT DETECTED Final    Comment: Performed at [facility], [place]. [address], [place], [place] [address]     Labs: BNP (last 3 results) No results for input(s): BNP in the last 8760 hours. Basic Metabolic Panel: Recent Labs  Lab 10/24/20 2034 10/25/20 0123  NA 138  --   K 3.8  --   CL 100  --   CO2 29  --  GLUCOSE 146*  --   BUN 16  --   CREATININE 0.76 0.65  CALCIUM 9.4  --    Liver Function Tests: No results for input(s): AST, ALT, ALKPHOS, BILITOT, PROT, ALBUMIN in the last 168 hours. No results for input(s): LIPASE, AMYLASE in the last 168 hours. No results for input(s): AMMONIA in the last 168 hours. CBC: Recent Labs  Lab 10/24/20 2034 10/25/20 0123  WBC 10.1 9.1  HGB 12.9 13.0  HCT 38.7 39.0  MCV 93.9 94.2  PLT 226 235   Cardiac Enzymes: No results for input(s): CKTOTAL, CKMB, CKMBINDEX, TROPONINI in the last 168 hours. BNP: Invalid input(s): POCBNP CBG: No results for input(s): GLUCAP in the last 168 hours. D-Dimer Recent Labs    10/24/20 2034  DDIMER 1.61*   Hgb A1c No results for input(s): HGBA1C in the last 72 hours. Lipid Profile No results for input(s): CHOL, HDL, LDLCALC, TRIG, CHOLHDL, LDLDIRECT in the last 72 hours. Thyroid function studies No results for input(s): TSH, T4TOTAL, T3FREE, THYROIDAB in the last 72 hours.  Invalid input(s): FREET3 Anemia work up No results for input(s): VITAMINB12, FOLATE, FERRITIN, TIBC, IRON, RETICCTPCT in the last 72 hours. Urinalysis    Component Value Date/Time   COLORURINE yellow 09/13/2008 1359   APPEARANCEUR Clear 09/13/2008 1359   LABSPEC 1.015 09/13/2008 1359   PHURINE 6.0 09/13/2008 1359   HGBUR moderate 09/13/2008 1359   BILIRUBINUR negative 09/13/2008 1359   UROBILINOGEN 0.2  09/13/2008 1359   NITRITE negative 09/13/2008 1359   Sepsis Labs Invalid input(s): PROCALCITONIN,  WBC,  LACTICIDVEN Microbiology Recent Results (from the past 240 hour(s))  Blood culture (single)     Status: None (Preliminary result)   Collection Time: 10/24/20  8:34 PM   Specimen: BLOOD  Result Value Ref Range Status   Specimen Description BLOOD LEFT ANTECUBITAL  Final   Special Requests   Final    BOTTLES DRAWN AEROBIC AND ANAEROBIC Blood Culture adequate volume   Culture   Final    NO GROWTH < 12 HOURS Performed at [facility], [address] [place], [place] [address]    Report Status PENDING  Incomplete  Resp Panel by RT-PCR (Flu A&B, Covid) Nasopharyngeal Swab     Status: None   Collection Time: 10/24/20  8:34 PM   Specimen: Nasopharyngeal Swab; Nasopharyngeal(NP) swabs in vial transport medium  Result Value Ref Range Status   SARS Coronavirus 2 by RT PCR NEGATIVE NEGATIVE Final    Comment: (NOTE) SARS-CoV-2 target nucleic acids are NOT DETECTED.  The SARS-CoV-2 RNA is generally detectable in upper respiratory specimens during the acute phase of infection. The lowest concentration of SARS-CoV-2 viral copies this assay can detect is 138 copies/mL. A negative result does not preclude SARS-Cov-2 infection and should not be used as the sole basis for treatment or other patient management decisions. A negative result may occur with  improper specimen collection/handling, submission of specimen other than nasopharyngeal swab, presence of viral mutation(s) within the areas targeted by this assay, and inadequate number of viral copies(<138 copies/mL). A negative result must be combined with clinical observations, patient history, and epidemiological information. The expected result is Negative.  Fact Sheet for Patients:  EntrepreneurPulse.com.au  Fact Sheet for Healthcare Providers:  IncredibleEmployment.be  This test is no t  yet approved or cleared by the [place] FDA and  has been authorized for detection and/or diagnosis of SARS-CoV-2 by FDA under an Emergency Use Authorization (EUA). This EUA will remain  in effect (  meaning this test can be used) for the duration of the COVID-19 declaration under Section 564(b)(1) of the Act, 21 U.S.C.section 360bbb-3(b)(1), unless the authorization is terminated  or revoked sooner.       Influenza A by PCR NEGATIVE NEGATIVE Final   Influenza B by PCR NEGATIVE NEGATIVE Final    Comment: (NOTE) The Xpert Xpress SARS-CoV-2/FLU/RSV plus assay is intended as an aid in the diagnosis of influenza from Nasopharyngeal swab specimens and should not be used as a sole basis for treatment. Nasal washings and aspirates are unacceptable for Xpert Xpress SARS-CoV-2/FLU/RSV testing.  Fact Sheet for Patients: EntrepreneurPulse.com.au  Fact Sheet for Healthcare Providers: IncredibleEmployment.be  This test is not yet approved or cleared by the [place] FDA and has been authorized for detection and/or diagnosis of SARS-CoV-2 by FDA under an Emergency Use Authorization (EUA). This EUA will remain in effect (meaning this test can be used) for the duration of the COVID-19 declaration under Section 564(b)(1) of the Act, 21 U.S.C. section 360bbb-3(b)(1), unless the authorization is terminated or revoked.  Performed at [facility], [place], [place] [address]   Respiratory (~20 pathogens) panel by PCR     Status: None   Collection Time: 10/25/20  9:45 AM   Specimen: Nasopharyngeal Swab; Respiratory  Result Value Ref Range Status   Adenovirus NOT DETECTED NOT DETECTED Final   Coronavirus 229E NOT DETECTED NOT DETECTED Final    Comment: (NOTE) The Coronavirus on the Respiratory Panel, DOES NOT test for the novel  Coronavirus (2019 nCoV)    Coronavirus HKU1 NOT DETECTED NOT DETECTED Final   Coronavirus NL63 NOT  DETECTED NOT DETECTED Final   Coronavirus OC43 NOT DETECTED NOT DETECTED Final   Metapneumovirus NOT DETECTED NOT DETECTED Final   Rhinovirus / Enterovirus NOT DETECTED NOT DETECTED Final   Influenza A NOT DETECTED NOT DETECTED Final   Influenza B NOT DETECTED NOT DETECTED Final   Parainfluenza Virus 1 NOT DETECTED NOT DETECTED Final   Parainfluenza Virus 2 NOT DETECTED NOT DETECTED Final   Parainfluenza Virus 3 NOT DETECTED NOT DETECTED Final   Parainfluenza Virus 4 NOT DETECTED NOT DETECTED Final   Respiratory Syncytial Virus NOT DETECTED NOT DETECTED Final   Bordetella pertussis NOT DETECTED NOT DETECTED Final   Bordetella Parapertussis NOT DETECTED NOT DETECTED Final   Chlamydophila pneumoniae NOT DETECTED NOT DETECTED Final   Mycoplasma pneumoniae NOT DETECTED NOT DETECTED Final    Comment: Performed at [facility], [place]. [address], [place], [place] [address]    Procedures/Studies: DG Chest 2 View  Result Date: 10/24/2020 CLINICAL DATA:  Hypoxia, pneumonia, short of breath, chest pain EXAM: CHEST - 2 VIEW COMPARISON:  03/20/2013 FINDINGS: Frontal and lateral views of the chest demonstrate a stable cardiac silhouette. There is chronic eventration of the right hemidiaphragm, partially obscuring the right heart border. There is bilateral parenchymal lung scarring, greatest at the left lung base. No acute airspace disease, effusion, or pneumothorax. No acute bony abnormalities. IMPRESSION: 1. No acute intrathoracic process. Electronically Signed   By: Randa Ngo M.D.   On: 10/24/2020 20:54   CT Angio Chest PE W/Cm &/Or Wo Cm  Result Date: 10/25/2020 CLINICAL DATA:  Positive D-dimer.  Concern for pulmonary embolism. EXAM: CT ANGIOGRAPHY CHEST WITH CONTRAST TECHNIQUE: Multidetector CT imaging of the chest was performed using the standard protocol during bolus administration of intravenous contrast. Multiplanar CT image reconstructions and MIPs were obtained to evaluate the vascular  anatomy. CONTRAST:  12m  OMNIPAQUE IOHEXOL 350 MG/ML SOLN COMPARISON:  Chest radiograph dated 10/24/2020. FINDINGS: Cardiovascular: Top-normal cardiac size. No pericardial effusion. There is calcification of the mitral annulus. Mild atherosclerotic calcification of the thoracic aorta. No aneurysmal dilatation. The origins of the great vessels of the aortic arch appear patent. No pulmonary artery embolus identified. Mediastinum/Nodes: There is no hilar adenopathy. Mildly enlarged and rounded mediastinal lymph node measures 11 mm (98/5) of indeterminate significance. The esophagus is grossly unremarkable. No mediastinal fluid collection. Lungs/Pleura: Diffuse bilateral interstitial and airspace hazy densities which may represent atelectasis or atypical pneumonia. Clinical correlation is recommended. No lobar consolidation, pleural effusion, or pneumothorax. The central airways are patent. 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 Focal area of parenchymal atrophy and cortical scarring of the medial upper pole of the left kidney. Small focus of parenchymal calcification. The visualized upper abdomen is otherwise unremarkable. Musculoskeletal: Osteopenia with degenerative changes of the spine. T10 hemangioma. No acute osseous pathology. Review of the MIP images confirms the above findings. IMPRESSION: 1. No CT evidence of pulmonary embolism. 2. Diffuse bilateral interstitial and airspace hazy densities may represent atelectasis or atypical pneumonia. Clinical correlation is recommended. 3. Aortic Atherosclerosis (ICD10-I70.0). Electronically Signed   By: Anner Crete M.D.   On: 10/25/2020 02:17     Time coordinating discharge: Over 30 minutes    Dwyane Dee, MD  [facility] 10/25/2020, 6:01 PM

## 2020-10-25 NOTE — ED Notes (Signed)
Pt. Ambulated around room, O2 sats 86%. After sitting down in bed, pt's O2 sat  recovered to 91% on RA. Dr. Damita Dunnings notified, will await instruction as to whether pt. Should stay on 2L [place], or RA while in bed.

## 2020-10-25 NOTE — Progress Notes (Signed)
SATURATION QUALIFICATIONS: (This note is used to comply with regulatory documentation for home oxygen)  Patient Saturations on Room Air at Rest = 95%  Patient Saturations on Room Air while Ambulating = 94%  Please briefly explain why patient needs home oxygen:

## 2020-10-25 NOTE — ED Provider Notes (Signed)
[facility] Emergency Department Provider Note   ____________________________________________   Event Date/Time   First MD Initiated Contact with Patient 10/25/20 0109     (approximate)  I have reviewed the triage vital signs and the nursing notes.   HISTORY  Chief Complaint Shortness of Breath    HPI Erika Evans is a 86 y.o. female sent [facility] the [facility] for bilateral pneumonia and low oxygen while walking.  Patient reports a 2-week history of productive cough, shortness of breath, headache and generalized malaise.  Denies fever, chills, chest pain, abdominal pain, nausea, vomiting or dizziness.  Denies slurred speech, facial droop, extremity weakness/numbness/tingling.     Past Medical History:  Diagnosis Date  . Asthma   . Cataract 2019   resolved with surgery  . Congenital fusion of cervical spine   . Elevated liver enzymes   . Foot pain    Left; nerve problem; procedure  . GERD (gastroesophageal reflux disease)   . Hepatitis B carrier ([place])    Positive hep B test at [facility]  . Hyperlipidemia    Mild  . Osteoarthritis   . Osteopenia   . Pneumonia 2004    Patient Active Problem List   Diagnosis Date Noted  . COVID-19 vaccine series declined 12/12/2019  . Scalp itch 08/24/2019  . Diarrhea 08/22/2019  . Hypokalemia 08/22/2019  . Positive fecal immunochemical test 02/15/2019  . Venous insufficiency of both lower extremities 12/21/2017  . Routine general medical examination at a health care facility 01/28/2015  . Screening for colon cancer 01/28/2015  . Estrogen deficiency 01/28/2015  . Encounter for Medicare annual wellness exam 11/26/2012  . GERD (gastroesophageal reflux disease) 04/21/2011  . OA (osteoarthritis) 04/21/2011  . Hyperlipidemia 04/21/2011  . Osteopenia 04/21/2011  . Obese 04/21/2011    Past Surgical History:  Procedure Laterality Date  . "Blocked kidney"    . BACK SURGERY    .  CARDIOVASCULAR STRESS TEST  9/07   Neg  . CATARACT EXTRACTION W/ INTRAOCULAR LENS IMPLANT Right 05/06/2017   Dr. Tommy Rainwater  . CATARACT EXTRACTION W/ INTRAOCULAR LENS IMPLANT Left 05/20/2017   Dr. Tommy Rainwater  . COLONOSCOPY  9/06   Diverticulosis, hems  . COLONOSCOPY  04/04/2019  . Dexa     Osteopenis 9/01, improved 10/03, decreased BMD 5/06  . PARTIAL HYSTERECTOMY    . UPPER GASTROINTESTINAL ENDOSCOPY  04/04/2019  . US ECHOCARDIOGRAPHY  7/03   Abd-normal  . US RENAL/AORTA  2007   Neg    Prior to Admission medications   Medication Sig Start Date End Date Taking? Authorizing Provider  Acetaminophen (TYLENOL ARTHRITIS PAIN PO) Take 1 tablet by mouth daily.     [provider]  albuterol (PROVENTIL HFA;VENTOLIN HFA) 108 (90 BASE) MCG/ACT inhaler Inhale 2 puffs into the lungs every 4 (four) hours as needed for wheezing or shortness of breath. 01/28/15   Tower, Wynelle Fanny, MD  aspirin 81 MG tablet Take 81 mg by mouth daily.      [provider]  Bepotastine Besilate (BEPREVE) 1.5 % SOLN Place 1 drop into both eyes as needed.    [provider]  Cholecalciferol (VITAMIN D-3) 1000 UNITS CAPS Take 2 capsules by mouth daily.    [provider]  fish oil-omega-3 fatty acids 1000 MG capsule Take 1 g by mouth 2 (two) times daily.     [provider]  Fluticasone-Salmeterol (ADVAIR DISKUS) 250-50 MCG/DOSE AEPB Inhale 1 puff into the lungs  every 12 (twelve) hours.     [provider]  levocetirizine (XYZAL) 5 MG tablet Take 5 mg by mouth at bedtime.    [provider]  montelukast (SINGULAIR) 10 MG tablet Take 10 mg by mouth daily.      [provider]  omeprazole (PRILOSEC) 20 MG capsule TAKE 1 CAPSULE (20 MG TOTAL) BY MOUTH 2 (TWO) TIMES DAILY BEFORE A MEAL. 05/14/20   Tower, Wynelle Fanny, MD    Allergies Alendronate sodium, Miacalcin [calcitonin (salmon)], and Raloxifene  Family History  Problem Relation Age of Onset  . Lung cancer  Father   . Cancer Sister        Bladder  . Heart attack Brother   . Leukemia Brother   . Stroke Brother   . Colon cancer Neg Hx   . Colon polyps Neg Hx   . Esophageal cancer Neg Hx   . Rectal cancer Neg Hx   . Stomach cancer Neg Hx   Family history of blood clots  Social History Social History   Tobacco Use  . Smoking status: Never  . Smokeless tobacco: Never  Vaping Use  . Vaping Use: Never used  Substance Use Topics  . Alcohol use: No    Alcohol/week: 0.0 standard drinks  . Drug use: No    Review of Systems  Constitutional: No fever/chills Eyes: No visual changes. ENT: No sore throat. Cardiovascular: Denies chest pain. Respiratory: Positive for cough and shortness of breath. Gastrointestinal: No abdominal pain.  No nausea, no vomiting.  No diarrhea.  No constipation. Genitourinary: Negative for dysuria. Musculoskeletal: Negative for back pain. Skin: Negative for rash. Neurological: Positive for headache. Negative for focal weakness or numbness.   ____________________________________________   PHYSICAL EXAM:  VITAL SIGNS: ED Triage Vitals  Enc Vitals Group     BP 10/24/20 2032 137/73     Pulse Rate 10/24/20 2032 (!) 117     Resp 10/24/20 2032 20     Temp 10/24/20 2032 98.7 F (37.1 C)     Temp Source 10/24/20 2032 Oral     SpO2 10/24/20 2032 96 %     Weight 10/24/20 2025 145 lb 9.6 oz (66 kg)     Height 10/24/20 2025 5' (1.524 m)     Head Circumference --      Peak Flow --      Pain Score 10/24/20 2024 0     Pain Loc --      Pain Edu? --      Excl. in [place]? --     Constitutional: Alert and oriented.  Elderly appearing and in no acute distress. Eyes: Conjunctivae are normal. PERRL. EOMI. Head: Atraumatic. Nose: No congestion/rhinnorhea. Mouth/Throat: Mucous membranes are mildly dry. Neck: No stridor.   Cardiovascular: Normal rate, regular rhythm. Grossly normal heart sounds.  Good peripheral circulation. Respiratory: Increased respiratory effort.   No retractions. Lungs CTAB. Gastrointestinal: Soft and nontender. No distention. No abdominal bruits. No CVA tenderness. Musculoskeletal: No lower extremity tenderness nor edema.  No joint effusions. Neurologic:  Normal speech and language. No gross focal neurologic deficits are appreciated. No gait instability. Skin:  Skin is warm, dry and intact. No rash noted. Psychiatric: Mood and affect are normal. Speech and behavior are normal.  ____________________________________________   LABS (all labs ordered are listed, but only abnormal results are displayed)  Labs Reviewed  BASIC METABOLIC PANEL - Abnormal; Notable for the following components:      Result Value   Glucose, Bld 146 (*)  All other components within normal limits  D-DIMER, QUANTITATIVE - Abnormal; Notable for the following components:   D-Dimer, Quant 1.61 (*)    All other components within normal limits  RESP PANEL BY RT-PCR (FLU A&B, COVID) ARPGX2  CULTURE, BLOOD (SINGLE)  CBC  TROPONIN I (HIGH SENSITIVITY)  TROPONIN I (HIGH SENSITIVITY)   ____________________________________________  EKG  ED ECG REPORT I, Sharetha Newson J, the attending physician, personally viewed and interpreted this ECG.   Date: 10/25/2020  EKG Time: 2027  Rate: 103  Rhythm: sinus tachycardia  Axis: Normal  Intervals:none  ST&T Change: Nonspecific  ____________________________________________  RADIOLOGY I, Kemo Spruce J, personally viewed and evaluated these images (plain radiographs) as part of my medical decision making, as well as reviewing the written report by the radiologist.  ED MD interpretation: No acute cardiopulmonary process; CTA demonstrates no PE but atypical pneumonia  Official radiology report(s): DG Chest 2 View  Result Date: 10/24/2020 CLINICAL DATA:  Hypoxia, pneumonia, short of breath, chest pain EXAM: CHEST - 2 VIEW COMPARISON:  03/20/2013 FINDINGS: Frontal and lateral views of the chest demonstrate a stable  cardiac silhouette. There is chronic eventration of the right hemidiaphragm, partially obscuring the right heart border. There is bilateral parenchymal lung scarring, greatest at the left lung base. No acute airspace disease, effusion, or pneumothorax. No acute bony abnormalities. IMPRESSION: 1. No acute intrathoracic process. Electronically Signed   By: Randa Ngo M.D.   On: 10/24/2020 20:54   CT Angio Chest PE W/Cm &/Or Wo Cm  Result Date: 10/25/2020 CLINICAL DATA:  Positive D-dimer.  Concern for pulmonary embolism. EXAM: CT ANGIOGRAPHY CHEST WITH CONTRAST TECHNIQUE: Multidetector CT imaging of the chest was performed using the standard protocol during bolus administration of intravenous contrast. Multiplanar CT image reconstructions and MIPs were obtained to evaluate the vascular anatomy. CONTRAST:  10m OMNIPAQUE IOHEXOL 350 MG/ML SOLN COMPARISON:  Chest radiograph dated 10/24/2020. FINDINGS: Cardiovascular: Top-normal cardiac size. No pericardial effusion. There is calcification of the mitral annulus. Mild atherosclerotic calcification of the thoracic aorta. No aneurysmal dilatation. The origins of the great vessels of the aortic arch appear patent. No pulmonary artery embolus identified. Mediastinum/Nodes: There is no hilar adenopathy. Mildly enlarged and rounded mediastinal lymph node measures 11 mm (98/5) of indeterminate significance. The esophagus is grossly unremarkable. No mediastinal fluid collection. Lungs/Pleura: Diffuse bilateral interstitial and airspace hazy densities which may represent atelectasis or atypical pneumonia. Clinical correlation is recommended. No lobar consolidation, pleural effusion, or pneumothorax. The central airways are patent. 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 Focal area of parenchymal atrophy and cortical scarring of the medial upper pole of the left kidney. Small focus of parenchymal calcification. The visualized upper abdomen is  otherwise unremarkable. Musculoskeletal: Osteopenia with degenerative changes of the spine. T10 hemangioma. No acute osseous pathology. Review of the MIP images confirms the above findings. IMPRESSION: 1. No CT evidence of pulmonary embolism. 2. Diffuse bilateral interstitial and airspace hazy densities may represent atelectasis or atypical pneumonia. Clinical correlation is recommended. 3. Aortic Atherosclerosis (ICD10-I70.0). Electronically Signed   By: AAnner CreteM.D.   On: 10/25/2020 02:17    ____________________________________________   PROCEDURES  Procedure(s) performed (including Critical Care):  .1-3 Lead EKG Interpretation  Date/Time: 10/25/2020 1:22 AM Performed by: SPaulette Blanch MD Authorized by: SPaulette Blanch MD     Interpretation: normal     ECG rate:  85   ECG rate assessment: normal     Rhythm: sinus rhythm  Ectopy: none     Conduction: normal   Comments:     Patient placed on cardiac monitor to evaluate for arrhythmias   ____________________________________________   INITIAL IMPRESSION / ASSESSMENT AND PLAN / ED COURSE  As part of my medical decision making, I reviewed the following data within the Branch notes reviewed and incorporated, Labs reviewed, EKG interpreted, Old chart reviewed, Radiograph reviewed, and Notes from prior ED visits     86 year old female sent to the ED from urgent care for hypoxia and pneumonia. Differential includes, but is not limited to, viral syndrome, bronchitis including COPD exacerbation, pneumonia, reactive airway disease including asthma, CHF including exacerbation with or without pulmonary/interstitial edema, pneumothorax, ACS, thoracic trauma, and pulmonary embolism.   Laboratory results notable for elevated D-dimer.  Troponins x2 and respiratory panel are negative.  Chest x-ray negative for pneumonia.  Will obtain CTA chest to evaluate for PE.  Patient's slightly tachypneic at rest with a  history of asthma; will administer IV Solu-Medrol and DuoNeb.  Will perform ambulation trial and reassess.  Clinical Course as of 10/25/20 0245  Sat Oct 25, 2020  0243 Updated patient on CTA results.  She is tachypneic with room air saturation 90%.  Placed on 2 L nasal cannula oxygen which improved her saturation to 94%.  Will discuss with hospitalist services for admission. [JS]    Clinical Course User Index [JS] Paulette Blanch, MD     ____________________________________________   FINAL CLINICAL IMPRESSION(S) / ED DIAGNOSES  Final diagnoses:  Shortness of breath  Moderate persistent asthma with exacerbation  Atypical pneumonia     ED Discharge Orders     None        Note:  This document was prepared using Dragon voice recognition software and may include unintentional dictation errors.    Paulette Blanch, MD 10/25/20 [phone]

## 2020-10-25 NOTE — H&P (Signed)
History and Physical    Erika Evans [id number] DOB: 02/25/1935 DOA: 10/25/2020  PCP: Abner Greenspan, MD   Patient coming from: home  I have personally briefly reviewed patient's old medical records in [place]  Chief Complaint: couhg, shortness of breath  HPI: Erika Evans is a 86 y.o. female with medical history significant for asthma who was sent from urgent care with concern for pneumonia and hypoxia after presenting with a 2-week history of productive cough, shortness of breath and generalized malaise.  She denies fever or chills and denies chest pain, abdominal pain, diarrhea, nausea or vomiting  ED course: On arrival afebrile, tachycardic at 117, BP 131/73 with O2 sat 89-90 at rest for which she was placed on 2 L O2 Blood work: WBC 10,000, troponin 8-8.  D-dimer 1.61.  COVID and flu negative  EKG, personally viewed and interpreted: Sinus tachycardia at 103 with nonspecific ST-T wave changes  Imaging: Chest x-ray with no acute intrathoracic process CTA chest no evidence of PE.  Diffuse bilateral interstitial and airspace hazy densities which may represent atypical pneumonia  Patient was treated with IV Solu-Medrol and DuoNeb.  Hospitalist consulted for admission due to persistent tachypnea and borderline hypoxia and possible pneumonia on CTA chest.  Review of Systems: As per HPI otherwise all other systems on review of systems negative.    Past Medical History:  Diagnosis Date   Asthma    Cataract 2019   resolved with surgery   Congenital fusion of cervical spine    Elevated liver enzymes    Foot pain    Left; nerve problem; procedure   GERD (gastroesophageal reflux disease)    Hepatitis B carrier ([facility])    Positive hep B test at [facility]   Hyperlipidemia    Mild   Osteoarthritis    Osteopenia    Pneumonia 2004    Past Surgical History:  Procedure Laterality Date   "Blocked kidney"     BACK SURGERY     CARDIOVASCULAR STRESS TEST  9/07    Neg   CATARACT EXTRACTION W/ INTRAOCULAR LENS IMPLANT Right 05/06/2017   Dr. Tommy Rainwater   CATARACT EXTRACTION W/ INTRAOCULAR LENS IMPLANT Left 05/20/2017   Dr. Tommy Rainwater   COLONOSCOPY  9/06   Diverticulosis, hems   COLONOSCOPY  04/04/2019   Dexa     Osteopenis 9/01, improved 10/03, decreased BMD 5/06   PARTIAL HYSTERECTOMY     UPPER GASTROINTESTINAL ENDOSCOPY  04/04/2019   US ECHOCARDIOGRAPHY  7/03   Abd-normal   US RENAL/AORTA  2007   Neg     reports that she has never smoked. She has never used smokeless tobacco. She reports that she does not drink alcohol and does not use drugs.  Allergies  Allergen Reactions   Alendronate Sodium     REACTION: acid reflux   Miacalcin [Calcitonin ([place])] Other (See Comments)    Pt thinks the spray gave her a staph infection in her nose    Raloxifene     REACTION: severe muscle pain and weakness    Family History  Problem Relation Age of Onset   Lung cancer Father    Cancer Sister        Bladder   Heart attack Brother    Leukemia Brother    Stroke Brother    Colon cancer Neg Hx    Colon polyps Neg Hx    Esophageal cancer Neg Hx    Rectal cancer Neg Hx    Stomach  cancer Neg Hx       Prior to Admission medications   Medication Sig Start Date End Date Taking? Authorizing Provider  Acetaminophen (TYLENOL ARTHRITIS PAIN PO) Take 1 tablet by mouth daily.     [provider]  albuterol (PROVENTIL HFA;VENTOLIN HFA) 108 (90 BASE) MCG/ACT inhaler Inhale 2 puffs into the lungs every 4 (four) hours as needed for wheezing or shortness of breath. 01/28/15   Tower, Wynelle Fanny, MD  aspirin 81 MG tablet Take 81 mg by mouth daily.      [provider]  Bepotastine Besilate (BEPREVE) 1.5 % SOLN Place 1 drop into both eyes as needed.    [provider]  Cholecalciferol (VITAMIN D-3) 1000 UNITS CAPS Take 2 capsules by mouth daily.    [provider]  fish oil-omega-3 fatty acids 1000 MG capsule Take 1 g by mouth 2 (two)  times daily.     [provider]  Fluticasone-Salmeterol (ADVAIR DISKUS) 250-50 MCG/DOSE AEPB Inhale 1 puff into the lungs every 12 (twelve) hours.     [provider]  levocetirizine (XYZAL) 5 MG tablet Take 5 mg by mouth at bedtime.    [provider]  montelukast (SINGULAIR) 10 MG tablet Take 10 mg by mouth daily.      [provider]  omeprazole (PRILOSEC) 20 MG capsule TAKE 1 CAPSULE (20 MG TOTAL) BY MOUTH 2 (TWO) TIMES DAILY BEFORE A MEAL. 05/14/20   Abner Greenspan, MD    Physical Exam: Vitals:   10/24/20 2025 10/24/20 2032 10/25/20 0017 10/25/20 0125  BP:  137/73 (!) 149/84 131/76  Pulse:  (!) 117 87 88  Resp:  20 20 (!) 30  Temp:  98.7 F (37.1 C) 98.6 F (37 C)   TempSrc:  Oral Oral   SpO2:  96% 95% 95%  Weight: 66 kg     Height: 5' (1.524 m)        Vitals:   10/24/20 2025 10/24/20 2032 10/25/20 0017 10/25/20 0125  BP:  137/73 (!) 149/84 131/76  Pulse:  (!) 117 87 88  Resp:  20 20 (!) 30  Temp:  98.7 F (37.1 C) 98.6 F (37 C)   TempSrc:  Oral Oral   SpO2:  96% 95% 95%  Weight: 66 kg     Height: 5' (1.524 m)         Constitutional: Elderly female, alert and oriented.  Conversational dyspnea HEENT:      Head: Normocephalic and atraumatic.         Eyes: PERLA, EOMI, Conjunctivae are normal. Sclera is non-icteric.       Mouth/Throat: Mucous membranes are moist.       Neck: Supple with no signs of meningismus. Cardiovascular: Regular rate and rhythm. No murmurs, gallops, or rubs. 2+ symmetrical distal pulses are present . No JVD. No LE edema Respiratory: Respiratory effort increased.  Tachypneic.Lungs sounds clear bilaterally. No wheezes, crackles, or rhonchi.  Gastrointestinal: Soft, non tender, and non distended with positive bowel sounds.  Genitourinary: No CVA tenderness. Musculoskeletal: Nontender with normal range of motion in all extremities. No cyanosis, or erythema of extremities. Neurologic:  Face is symmetric. Moving  all extremities. No gross focal neurologic deficits . Skin: Skin is warm, dry.  No rash or ulcers Psychiatric: Mood and affect are normal    Labs on Admission: I have personally reviewed following labs and imaging studies  CBC: Recent Labs  Lab 10/24/20 2034  WBC 10.1  HGB 12.9  HCT  38.7  MCV 93.9  PLT [id number]   Basic Metabolic Panel: Recent Labs  Lab 10/24/20 2034  NA 138  K 3.8  CL 100  CO2 29  GLUCOSE 146*  BUN 16  CREATININE 0.76  CALCIUM 9.4   GFR: Estimated Creatinine Clearance: 44.4 mL/min (by C-G formula based on SCr of 0.76 mg/dL). Liver Function Tests: No results for input(s): AST, ALT, ALKPHOS, BILITOT, PROT, ALBUMIN in the last 168 hours. No results for input(s): LIPASE, AMYLASE in the last 168 hours. No results for input(s): AMMONIA in the last 168 hours. Coagulation Profile: No results for input(s): INR, PROTIME in the last 168 hours. Cardiac Enzymes: No results for input(s): CKTOTAL, CKMB, CKMBINDEX, TROPONINI in the last 168 hours. BNP (last 3 results) No results for input(s): PROBNP in the last 8760 hours. HbA1C: No results for input(s): HGBA1C in the last 72 hours. CBG: No results for input(s): GLUCAP in the last 168 hours. Lipid Profile: No results for input(s): CHOL, HDL, LDLCALC, TRIG, CHOLHDL, LDLDIRECT in the last 72 hours. Thyroid Function Tests: No results for input(s): TSH, T4TOTAL, FREET4, T3FREE, THYROIDAB in the last 72 hours. Anemia Panel: No results for input(s): VITAMINB12, FOLATE, FERRITIN, TIBC, IRON, RETICCTPCT in the last 72 hours. Urine analysis:    Component Value Date/Time   COLORURINE yellow 09/13/2008 1359   APPEARANCEUR Clear 09/13/2008 1359   LABSPEC 1.015 09/13/2008 1359   PHURINE 6.0 09/13/2008 1359   HGBUR moderate 09/13/2008 1359   BILIRUBINUR negative 09/13/2008 1359   UROBILINOGEN 0.2 09/13/2008 1359   NITRITE negative 09/13/2008 1359    Radiological Exams on Admission: DG Chest 2 View  Result Date:  10/24/2020 CLINICAL DATA:  Hypoxia, pneumonia, short of breath, chest pain EXAM: CHEST - 2 VIEW COMPARISON:  03/20/2013 FINDINGS: Frontal and lateral views of the chest demonstrate a stable cardiac silhouette. There is chronic eventration of the right hemidiaphragm, partially obscuring the right heart border. There is bilateral parenchymal lung scarring, greatest at the left lung base. No acute airspace disease, effusion, or pneumothorax. No acute bony abnormalities. IMPRESSION: 1. No acute intrathoracic process. Electronically Signed   By: Randa Ngo M.D.   On: 10/24/2020 20:54   CT Angio Chest PE W/Cm &/Or Wo Cm  Result Date: 10/25/2020 CLINICAL DATA:  Positive D-dimer.  Concern for pulmonary embolism. EXAM: CT ANGIOGRAPHY CHEST WITH CONTRAST TECHNIQUE: Multidetector CT imaging of the chest was performed using the standard protocol during bolus administration of intravenous contrast. Multiplanar CT image reconstructions and MIPs were obtained to evaluate the vascular anatomy. CONTRAST:  13m OMNIPAQUE IOHEXOL 350 MG/ML SOLN COMPARISON:  Chest radiograph dated 10/24/2020. FINDINGS: Cardiovascular: Top-normal cardiac size. No pericardial effusion. There is calcification of the mitral annulus. Mild atherosclerotic calcification of the thoracic aorta. No aneurysmal dilatation. The origins of the great vessels of the aortic arch appear patent. No pulmonary artery embolus identified. Mediastinum/Nodes: There is no hilar adenopathy. Mildly enlarged and rounded mediastinal lymph node measures 11 mm (98/5) of indeterminate significance. The esophagus is grossly unremarkable. No mediastinal fluid collection. Lungs/Pleura: Diffuse bilateral interstitial and airspace hazy densities which may represent atelectasis or atypical pneumonia. Clinical correlation is recommended. No lobar consolidation, pleural effusion, or pneumothorax. The central airways are patent. Upper Abdomen: There is a 9 mm rim calcified splenic  artery aneurysm. Focal area of parenchymal atrophy and cortical scarring of the medial upper pole of the left kidney. Small focus of parenchymal calcification. The visualized upper abdomen is otherwise unremarkable. Musculoskeletal: Osteopenia with degenerative changes of the  spine. T10 hemangioma. No acute osseous pathology. Review of the MIP images confirms the above findings. IMPRESSION: 1. No CT evidence of pulmonary embolism. 2. Diffuse bilateral interstitial and airspace hazy densities may represent atelectasis or atypical pneumonia. Clinical correlation is recommended. 3. Aortic Atherosclerosis (ICD10-I70.0). Electronically Signed   By: Anner Crete M.D.   On: 10/25/2020 02:17     Assessment/Plan 86 year old female with history of asthma who was sent from urgent care with concern for pneumonia and hypoxia after presenting with a 2-week history of productive cough, shortness of breath and generalized malaise.      Acute bronchitis with history of asthma   Community acquired pneumonia   Hypoxia - Suspect acute bronchitis/viral pneumonia.  Patient is afebrile with normal WBC.  COVID and flu negative - Hypoxic to 89-90 at rest in the ED - CTA chest with diffuse bilateral interstitial and airspace hazy densities which may represent atypical pneumonia - DuoNebs every 6 and as needed- Supplemental O2 to keep sats over 92% - Antitussives and supportive care - We will get procalcitonin to evaluate for need to treat with antibiotics    DVT prophylaxis: Lovenox  Code Status: full code  Family Communication:  none  Disposition Plan: Back to previous home environment Consults called: none Status:observation    Athena Masse MD [facility]     10/25/2020, 3:46 AM

## 2020-10-29 LAB — CULTURE, BLOOD (SINGLE)
Culture: NO GROWTH
Special Requests: ADEQUATE

## 2020-11-05 ENCOUNTER — Other Ambulatory Visit: Payer: Self-pay

## 2020-11-05 ENCOUNTER — Ambulatory Visit: Payer: Medicare Other | Admitting: Family Medicine

## 2020-11-05 ENCOUNTER — Encounter: Payer: Self-pay | Admitting: Family Medicine

## 2020-11-05 VITALS — BP 130/64 | HR 51 | Temp 97.6°F | Ht 60.0 in | Wt 145.0 lb

## 2020-11-05 DIAGNOSIS — J189 Pneumonia, unspecified organism: Secondary | ICD-10-CM | POA: Diagnosis not present

## 2020-11-05 DIAGNOSIS — R1013 Epigastric pain: Secondary | ICD-10-CM

## 2020-11-05 DIAGNOSIS — I700 Atherosclerosis of aorta: Secondary | ICD-10-CM

## 2020-11-05 DIAGNOSIS — R0789 Other chest pain: Secondary | ICD-10-CM

## 2020-11-05 MED ORDER — FAMOTIDINE 20 MG PO TABS: 20.0000 mg | ORAL_TABLET | Freq: Two times a day (BID) | ORAL | 0 refills | Status: DC

## 2020-11-05 NOTE — Progress Notes (Signed)
Subjective:    Patient ID: Erika Evans, female    DOB: 08-09-1934, 86 y.o.   MRN: [id number]  This visit occurred during the SARS-CoV-2 public health emergency.  Safety protocols were in place, including screening questions prior to the visit, additional usage of staff PPE, and extensive cleaning of exam room while observing appropriate contact time as indicated for disinfecting solutions.   HPI Pt presents for f/u of hosp for sob from 8/20 to 8/21  Wt Readings from Last 3 Encounters:  11/05/20 145 lb (65.8 kg)  10/24/20 145 lb 9.6 oz (66 kg)  03/03/20 141 lb 6 oz (64.1 kg)   28.32 kg/m  She presented to ER for cough/sob and malaise  CTA showed evid of atypical pneumonia  Req admission and 02 Was weaned off prior to d/c  Disch with amox, doxycycline and prednisone   Covid neg Blood cx neg Flu neg  Viral screen neg Lab Results  Component Value Date   CREATININE 0.65 10/25/2020   BUN 16 10/24/2020   NA 138 10/24/2020   K 3.8 10/24/2020   CL 100 10/24/2020   CO2 29 10/24/2020   Lab Results  Component Value Date   WBC 9.1 10/25/2020   HGB 13.0 10/25/2020   HCT 39.0 10/25/2020   MCV 94.2 10/25/2020   PLT 235 10/25/2020   DG Chest 2 View  Result Date: 10/24/2020 CLINICAL DATA:  Hypoxia, pneumonia, short of breath, chest pain EXAM: CHEST - 2 VIEW COMPARISON:  03/20/2013 FINDINGS: Frontal and lateral views of the chest demonstrate a stable cardiac silhouette. There is chronic eventration of the right hemidiaphragm, partially obscuring the right heart border. There is bilateral parenchymal lung scarring, greatest at the left lung base. No acute airspace disease, effusion, or pneumothorax. No acute bony abnormalities. IMPRESSION: 1. No acute intrathoracic process. Electronically Signed   By: Randa Ngo M.D.   On: 10/24/2020 20:54   CT Angio Chest PE W/Cm &/Or Wo Cm  Result Date: 10/25/2020 CLINICAL DATA:  Positive D-dimer.  Concern for pulmonary embolism. EXAM: CT  ANGIOGRAPHY CHEST WITH CONTRAST TECHNIQUE: Multidetector CT imaging of the chest was performed using the standard protocol during bolus administration of intravenous contrast. Multiplanar CT image reconstructions and MIPs were obtained to evaluate the vascular anatomy. CONTRAST:  37m OMNIPAQUE IOHEXOL 350 MG/ML SOLN COMPARISON:  Chest radiograph dated 10/24/2020. FINDINGS: Cardiovascular: Top-normal cardiac size. No pericardial effusion. There is calcification of the mitral annulus. Mild atherosclerotic calcification of the thoracic aorta. No aneurysmal dilatation. The origins of the great vessels of the aortic arch appear patent. No pulmonary artery embolus identified. Mediastinum/Nodes: There is no hilar adenopathy. Mildly enlarged and rounded mediastinal lymph node measures 11 mm (98/5) of indeterminate significance. The esophagus is grossly unremarkable. No mediastinal fluid collection. Lungs/Pleura: Diffuse bilateral interstitial and airspace hazy densities which may represent atelectasis or atypical pneumonia. Clinical correlation is recommended. No lobar consolidation, pleural effusion, or pneumothorax. The central airways are patent. 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 Focal area of parenchymal atrophy and cortical scarring of the medial upper pole of the left kidney. Small focus of parenchymal calcification. The visualized upper abdomen is otherwise unremarkable. Musculoskeletal: Osteopenia with degenerative changes of the spine. T10 hemangioma. No acute osseous pathology. Review of the MIP images confirms the above findings. IMPRESSION: 1. No CT evidence of pulmonary embolism. 2. Diffuse bilateral interstitial and airspace hazy densities may represent atelectasis or atypical pneumonia. Clinical correlation is recommended. 3. Aortic Atherosclerosis (  ICD10-I70.0). Electronically Signed   By: Anner Crete M.D.   On: 10/25/2020 02:17    Today : Pulse ox is 99% room air    Perking up some  Still weak and tired  Breathing is good when not moving around , a little sob at end of the day when she exerts herself   BP Readings from Last 3 Encounters:  11/05/20 130/64  10/25/20 120/77  02/06/20 120/60    Pulse Readings from Last 3 Encounters:  11/05/20 (!) 51  10/25/20 72  02/06/20 76   Back on reg meds Finished her pred and abx  Taking advair and singulair   (sees Dr Donneta Romberg)   Thinks sinuses were also infected and not that feels better   Stomach hurts on and off since the abx and prednisone Eating does not matter  Goes into chest occ (nl EKG in hospital)  Thinks at time her heart hurts /pressure   Lab Results  Component Value Date   CHOL 214 (H) 12/20/2018   HDL 61.00 12/20/2018   [place] 129 (H) 12/20/2018   LDLDIRECT 123.5 11/27/2012   TRIG 121.0 12/20/2018   CHOLHDL 4 12/20/2018  Has declined medication for this   Patient Active Problem List   Diagnosis Date Noted   Dyspepsia 11/05/2020   Aortic atherosclerosis ([place]) 11/05/2020   Chest discomfort 11/05/2020   Community acquired pneumonia 10/25/2020   COVID-19 vaccine series declined 12/12/2019   Scalp itch 08/24/2019   Diarrhea 08/22/2019   Hypokalemia 08/22/2019   Positive fecal immunochemical test 02/15/2019   Venous insufficiency of both lower extremities 12/21/2017   Routine general medical examination at a health care facility 01/28/2015   Screening for colon cancer 01/28/2015   Estrogen deficiency 01/28/2015   Encounter for Medicare annual wellness exam 11/26/2012   GERD (gastroesophageal reflux disease) 04/21/2011   OA (osteoarthritis) 04/21/2011   Hyperlipidemia 04/21/2011   Osteopenia 04/21/2011   Obese 04/21/2011   Past Medical History:  Diagnosis Date   Asthma    Cataract 2019   resolved with surgery   Congenital fusion of cervical spine    Elevated liver enzymes    Foot pain    Left; nerve problem; procedure   GERD (gastroesophageal reflux disease)     Hepatitis B carrier ([place])    Positive hep B test at [facility]   Hyperlipidemia    Mild   Osteoarthritis    Osteopenia    Pneumonia 2004   Past Surgical History:  Procedure Laterality Date   "Blocked kidney"     BACK SURGERY     CARDIOVASCULAR STRESS TEST  9/07   Neg   CATARACT EXTRACTION W/ INTRAOCULAR LENS IMPLANT Right 05/06/2017   Dr. Tommy Rainwater   CATARACT EXTRACTION W/ INTRAOCULAR LENS IMPLANT Left 05/20/2017   Dr. Tommy Rainwater   COLONOSCOPY  9/06   Diverticulosis, hems   COLONOSCOPY  04/04/2019   Dexa     Osteopenis 9/01, improved 10/03, decreased BMD 5/06   PARTIAL HYSTERECTOMY     UPPER GASTROINTESTINAL ENDOSCOPY  04/04/2019   US ECHOCARDIOGRAPHY  7/03   Abd-normal   US RENAL/AORTA  2007   Neg   Social History   Tobacco Use   Smoking status: Never   Smokeless tobacco: Never  Vaping Use   Vaping Use: Never used  Substance Use Topics   Alcohol use: No    Alcohol/week: 0.0 standard drinks   Drug use: No   Family History  Problem Relation Age of Onset   Lung cancer  Father    Cancer Sister        Bladder   Heart attack Brother    Leukemia Brother    Stroke Brother    Colon cancer Neg Hx    Colon polyps Neg Hx    Esophageal cancer Neg Hx    Rectal cancer Neg Hx    Stomach cancer Neg Hx    Allergies  Allergen Reactions   Alendronate Sodium     REACTION: acid reflux   Miacalcin [Calcitonin ([place])] Other (See Comments)    Pt thinks the spray gave her a staph infection in her nose    Raloxifene     REACTION: severe muscle pain and weakness   Current Outpatient Medications on File Prior to Visit  Medication Sig Dispense Refill   acetaminophen (TYLENOL) 325 MG tablet Take 2 tablets (650 mg total) by mouth every 4 (four) hours as needed for mild pain or moderate pain (or Fever >/= 101).     albuterol (PROVENTIL HFA;VENTOLIN HFA) 108 (90 BASE) MCG/ACT inhaler Inhale 2 puffs into the lungs every 4 (four) hours as needed for wheezing or shortness of breath. 1  Inhaler 3   aspirin 81 MG tablet Take 81 mg by mouth daily.       Bepotastine Besilate 1.5 % SOLN Place 1 drop into both eyes as needed.     Cholecalciferol (VITAMIN D-3) 1000 UNITS CAPS Take 2 capsules by mouth daily.     fish oil-omega-3 fatty acids 1000 MG capsule Take 1 g by mouth 2 (two) times daily.      Fluticasone-Salmeterol (ADVAIR) 250-50 MCG/DOSE AEPB Inhale 1 puff into the lungs every 12 (twelve) hours.      levocetirizine (XYZAL) 5 MG tablet Take 5 mg by mouth at bedtime.     montelukast (SINGULAIR) 10 MG tablet Take 10 mg by mouth daily.       No current facility-administered medications on file prior to visit.     Review of Systems  Constitutional:  Positive for fatigue. Negative for activity change, appetite change, fever and unexpected weight change.  HENT:  Negative for congestion, ear pain, rhinorrhea, sinus pressure and sore throat.   Eyes:  Negative for pain, redness and visual disturbance.  Respiratory:  Negative for cough, shortness of breath and wheezing.   Cardiovascular:  Positive for chest pain. Negative for palpitations and leg swelling.  Gastrointestinal:  Negative for abdominal pain, blood in stool, constipation, diarrhea and nausea.       Stomach upset/burning   Endocrine: Negative for polydipsia and polyuria.  Genitourinary:  Negative for dysuria, frequency and urgency.  Musculoskeletal:  Negative for arthralgias, back pain and myalgias.  Skin:  Negative for pallor and rash.  Allergic/Immunologic: Negative for environmental allergies.  Neurological:  Negative for dizziness, syncope and headaches.       Generalized weakness is improving  Hematological:  Negative for adenopathy. Does not bruise/bleed easily.  Psychiatric/Behavioral:  Negative for decreased concentration and dysphoric mood. The patient is not nervous/anxious.       Objective:   Physical Exam Constitutional:      General: She is not in acute distress.    Appearance: Normal appearance.  She is well-developed and normal weight. She is not ill-appearing or diaphoretic.  HENT:     Head: Normocephalic and atraumatic.     Mouth/Throat:     Mouth: Mucous membranes are moist.  Eyes:     General: No scleral icterus.    Conjunctiva/sclera: Conjunctivae normal.  Pupils: Pupils are equal, round, and reactive to light.  Neck:     Thyroid: No thyromegaly.     Vascular: No carotid bruit or JVD.  Cardiovascular:     Rate and Rhythm: Regular rhythm. Bradycardia present.     Pulses: Normal pulses.     Heart sounds: Normal heart sounds.    No gallop.  Pulmonary:     Effort: Pulmonary effort is normal. No respiratory distress.     Breath sounds: Normal breath sounds. No stridor. No wheezing, rhonchi or rales.     Comments: Good air exch No crackles No wheeze even on forced exp Chest:     Chest wall: No tenderness.  Abdominal:     General: Bowel sounds are normal. There is no distension or abdominal bruit.     Palpations: Abdomen is soft. There is no mass.     Tenderness: There is no abdominal tenderness. There is no guarding or rebound.     Hernia: No hernia is present.  Musculoskeletal:     Cervical back: Normal range of motion and neck supple.     Right lower leg: No edema.     Left lower leg: No edema.  Lymphadenopathy:     Cervical: No cervical adenopathy.  Skin:    General: Skin is warm and dry.     Coloration: Skin is not jaundiced or pale.     Findings: No bruising or rash.  Neurological:     Mental Status: She is alert.     Coordination: Coordination normal.     Deep Tendon Reflexes: Reflexes are normal and symmetric. Reflexes normal.  Psychiatric:        Mood and Affect: Mood normal.        Cognition and Memory: Cognition and memory normal.     Comments: Pleasant           Assessment & Plan:   Problem List Items Addressed This Visit       Cardiovascular and Mediastinum   Aortic atherosclerosis ([place])    Seen on CT scan-incidental Report rev with  pt  At times has some chest discomfort  Has declined medication for cholesterol in the past Cardiology referral done       Relevant Orders   Ambulatory referral to Cardiology     Respiratory   Community acquired pneumonia - Primary    Recent hospitalization  Reviewed hospital records, lab results and studies in detail   Good recovery so far with amox, doxycycline and prednisone  Reassuring exam and pulse ox  pna vaccines utd         Other   Dyspepsia    Px pepcid 20 mg bid for 2 wk then stop and update Suspect recent abx and prednisone for pna caused this       Chest discomfort    On and off/not pleuritic  Recent pna- improved , per pt chest symptoms pre dated that  Rev last EKG and hospital records Reassuring exam Aortic atherosclerosis on CT Ref done for cardiology      Relevant Orders   Ambulatory referral to Cardiology

## 2020-11-05 NOTE — Assessment & Plan Note (Signed)
Seen on CT scan-incidental Report rev with pt  At times has some chest discomfort  Has declined medication for cholesterol in the past Cardiology referral done

## 2020-11-05 NOTE — Assessment & Plan Note (Signed)
Recent hospitalization  Reviewed hospital records, lab results and studies in detail   Good recovery so far with amox, doxycycline and prednisone  Reassuring exam and pulse ox  pna vaccines utd

## 2020-11-05 NOTE — Patient Instructions (Addendum)
Take pepcid twice daily for 2 weeks  This is for stomach upset and abdominal and chest pain  Then stop it and let me know if you don't feel better or if symptoms return    The CT of the chest showed some cholesterol plaque in the aorta  Let's have you see a cardiologist  I placed a referral so you will get a call  It may take a while but I want to get it planned    Drink lots of fluids  Get rest when you need it

## 2020-11-05 NOTE — Assessment & Plan Note (Signed)
On and off/not pleuritic  Recent pna- improved , per pt chest symptoms pre dated that  Rev last EKG and hospital records Reassuring exam Aortic atherosclerosis on CT Ref done for cardiology

## 2020-11-05 NOTE — Assessment & Plan Note (Signed)
Px pepcid 20 mg bid for 2 wk then stop and update Suspect recent abx and prednisone for pna caused this

## 2020-11-06 ENCOUNTER — Other Ambulatory Visit: Payer: Self-pay | Admitting: Family Medicine

## 2020-11-07 ENCOUNTER — Telehealth: Payer: Self-pay | Admitting: Family Medicine

## 2020-11-07 NOTE — Telephone Encounter (Signed)
Mrs. Marentes called in and stated that she took the pepcid and she threw up and she got cramps and diarrhea and she said that she is not going to take the medication anymore

## 2020-11-07 NOTE — Telephone Encounter (Signed)
Pt notified of Dr. Marliss Coots comments and verbalized understanding. She is starting to feel better already but will keep [place] posted

## 2020-11-07 NOTE — Telephone Encounter (Signed)
So sorry to hear that  Don't take anymore Add to med intol list  Let us know if symptoms do not resolve

## 2020-11-27 ENCOUNTER — Other Ambulatory Visit: Payer: Self-pay | Admitting: Family Medicine

## 2020-12-11 ENCOUNTER — Ambulatory Visit: Payer: Medicare Other | Admitting: Cardiology

## 2020-12-11 ENCOUNTER — Other Ambulatory Visit: Payer: Self-pay

## 2020-12-11 ENCOUNTER — Encounter: Payer: Self-pay | Admitting: Cardiology

## 2020-12-11 VITALS — BP 120/68 | HR 82 | Ht 60.0 in | Wt 147.0 lb

## 2020-12-11 DIAGNOSIS — R072 Precordial pain: Secondary | ICD-10-CM

## 2020-12-11 DIAGNOSIS — I700 Atherosclerosis of aorta: Secondary | ICD-10-CM | POA: Diagnosis not present

## 2020-12-11 DIAGNOSIS — E7800 Pure hypercholesterolemia, unspecified: Secondary | ICD-10-CM | POA: Diagnosis not present

## 2020-12-11 NOTE — Patient Instructions (Signed)
Medication Instructions:   Your physician recommends that you continue on your current medications as directed. Please refer to the Current Medication list given to you today.  *If you need a refill on your cardiac medications before your next appointment, please call your pharmacy*   Lab Work:  Your physician recommends that you return for a FASTING lipid profile: at your earliest convenience.  - You will need to be fasting. Please do not have anything to eat or drink after midnight the morning you have the lab work. You may only have water or black coffee with no cream or sugar.   Please return to our office on _____________________at___________am/pm   Testing/Procedures:  Your physician has requested that you have an echocardiogram. Echocardiography is a painless test that uses sound waves to create images of your heart. It provides your doctor with information about the size and shape of your heart and how well your heart's chambers and valves are working. This procedure takes approximately one hour. There are no restrictions for this procedure.    Follow-Up: At [facility], you and your health needs are our priority.  As part of our continuing mission to provide you with exceptional heart care, we have created designated Provider Care Teams.  These Care Teams include your primary Cardiologist (physician) and Advanced Practice Providers (APPs -  Physician Assistants and Nurse Practitioners) who all work together to provide you with the care you need, when you need it.  We recommend signing up for the patient portal called "MyChart".  Sign up information is provided on this After Visit Summary.  MyChart is used to connect with patients for Virtual Visits (Telemedicine).  Patients are able to view lab/test results, encounter notes, upcoming appointments, etc.  Non-urgent messages can be sent to your provider as well.   To learn more about what you can do with MyChart, go to  [email or web address].    Your next appointment:   Follow up after Echo   The format for your next appointment:   In Person  Provider:   Kate Sable, MD   Other Instructions

## 2020-12-11 NOTE — Progress Notes (Signed)
Cardiology Office Note:    Date:  12/11/2020   ID:  Erika Evans, DOB 05/10/1934, MRN [id number]  PCP:  Abner Greenspan, MD   [facility] Providers Cardiologist:  None     Referring MD: Abner Greenspan, MD   Chief Complaint  Patient presents with   New Patient (Initial Visit)    Referred by PCP for Aortic atherosclerosis and Chest discomfort. Meds reviewed verbally with patient.     History of Present Illness:    Erika Evans is a 86 y.o. female with a hx of asthma, hyperlipidemia who presents due to aortic calcification.  Patient states having symptoms of chest pain for several weeks, also felt ill.  Presented to the ED, diagnosed with a pneumonia and treated with antibiotics steroids.  Subsequently discharged.  Symptoms overall have improved, rarely has chest pains of late.  She is more worried about her arthritic pain which has been ongoing for years.  Denies any history of heart disease.  While in the hospital, CT scan obtained to evaluate pulmonary disease did show thoracic aortic calcifications.  She takes aspirin 81 mg daily.  Manages her cholesterol with eating healthy, declined medical therapy in the past.  Last cholesterol check was over 2 years ago.  Past Medical History:  Diagnosis Date   Asthma    Cataract 2019   resolved with surgery   Congenital fusion of cervical spine    Elevated liver enzymes    Foot pain    Left; nerve problem; procedure   GERD (gastroesophageal reflux disease)    Hepatitis B carrier (HCC)    [facility] hep B test at [facility]   Hyperlipidemia    Mild   Osteoarthritis    Osteopenia    Pneumonia 2004    Past Surgical History:  Procedure Laterality Date   "Blocked kidney"     BACK SURGERY     CARDIOVASCULAR STRESS TEST  9/07   Neg   CATARACT EXTRACTION W/ INTRAOCULAR LENS IMPLANT Right 05/06/2017   Dr. Tommy Rainwater   CATARACT EXTRACTION W/ INTRAOCULAR LENS IMPLANT Left 05/20/2017   Dr. Tommy Rainwater   COLONOSCOPY  9/06    Diverticulosis, hems   COLONOSCOPY  04/04/2019   Dexa     Osteopenis 9/01, improved 10/03, decreased BMD 5/06   PARTIAL HYSTERECTOMY     UPPER GASTROINTESTINAL ENDOSCOPY  04/04/2019   US ECHOCARDIOGRAPHY  7/03   Abd-normal   US RENAL/AORTA  2007   Neg    Current Medications: Current Meds  Medication Sig   acetaminophen (TYLENOL) 325 MG tablet Take 2 tablets (650 mg total) by mouth every 4 (four) hours as needed for mild pain or moderate pain (or Fever >/= 101).   albuterol (PROVENTIL HFA;VENTOLIN HFA) 108 (90 BASE) MCG/ACT inhaler Inhale 2 puffs into the lungs every 4 (four) hours as needed for wheezing or shortness of breath.   aspirin 81 MG tablet Take 81 mg by mouth daily.     Bepotastine Besilate 1.5 % SOLN Place 1 drop into both eyes as needed.   Cholecalciferol (VITAMIN D-3) 1000 UNITS CAPS Take 2 capsules by mouth daily.   fish oil-omega-3 fatty acids 1000 MG capsule Take 1 g by mouth 2 (two) times daily.    Fluticasone-Salmeterol (ADVAIR) 250-50 MCG/DOSE AEPB Inhale 1 puff into the lungs every 12 (twelve) hours.    levocetirizine (XYZAL) 5 MG tablet Take 5 mg by mouth at bedtime.   montelukast (SINGULAIR) 10 MG tablet Take 10 mg by mouth  daily.       Allergies:   Alendronate sodium, Miacalcin [calcitonin (salmon)], Pepcid [famotidine], and Raloxifene   Social History   Socioeconomic History   Marital status: Widowed    Spouse name: Not on file   Number of children: 0   Years of education: Not on file   Highest education level: Not on file  Occupational History    Employer: [employer]    Comment: Retired  Tobacco Use   Smoking status: Never   Smokeless tobacco: Never  Vaping Use   Vaping Use: Never used  Substance and Sexual Activity   Alcohol use: No    Alcohol/week: 0.0 standard drinks   Drug use: No   Sexual activity: Not Currently  Other Topics Concern   Not on file  Social History Narrative   Lost husband November 2004.   Lives alone   Is a twin  (atually triplet but third (boy) died at birth)   Sometimes walks for exercise.   Retired [employer]   No children   Never smoker, No ETOH, rare caffeine   Social Determinants of Radio broadcast assistant Strain: Not on file  Food Insecurity: Not on file  Transportation Needs: Not on file  Physical Activity: Not on file  Stress: Not on file  Social Connections: Not on file     Family History: The patient's family history includes Cancer in her sister; Heart attack in her brother; Leukemia in her brother; Lung cancer in her father; Stroke in her brother. There is no history of Colon cancer, Colon polyps, Esophageal cancer, Rectal cancer, or Stomach cancer.  ROS:   Please see the history of present illness.     All other systems reviewed and are negative.  EKGs/Labs/Other Studies Reviewed:    The following studies were reviewed today:   EKG:  EKG is  ordered today.  The ekg ordered today demonstrates sinus rhythm, PACs, otherwise normal ECG  Recent Labs: 02/06/2020: ALT 22 10/24/2020: BUN 16; Potassium 3.8; Sodium 138 10/25/2020: Creatinine, Ser 0.65; Hemoglobin 13.0; Platelets 235  Recent Lipid Panel    Component Value Date/Time   CHOL 214 (H) 12/20/2018 1058   TRIG 121.0 12/20/2018 1058   HDL 61.00 12/20/2018 1058   CHOLHDL 4 12/20/2018 1058   VLDL 24.2 12/20/2018 1058   LDLCALC 129 (H) 12/20/2018 1058   LDLDIRECT 123.5 11/27/2012 1022     Risk Assessment/Calculations:          Physical Exam:    VS:  BP 120/68 (BP Location: Left Arm, Patient Position: Sitting, Cuff Size: Normal)   Pulse 82   Ht 5' (1.524 m)   Wt 147 lb (66.7 kg)   SpO2 96%   BMI 28.71 kg/m     Wt Readings from Last 3 Encounters:  12/11/20 147 lb (66.7 kg)  11/05/20 145 lb (65.8 kg)  10/24/20 145 lb 9.6 oz (66 kg)     GEN:  Well nourished, well developed in no acute distress HEENT: Normal NECK: No JVD; No carotid bruits LYMPHATICS: No lymphadenopathy CARDIAC: RRR, no  murmurs, rubs, gallops RESPIRATORY:  Clear to auscultation without rales, wheezing or rhonchi  ABDOMEN: Soft, non-tender, non-distended MUSCULOSKELETAL:  No edema; joint pain, Heberden's nodes noted in fingers SKIN: Warm and dry NEUROLOGIC:  Alert and oriented x 3 PSYCHIATRIC:  Normal affect   ASSESSMENT:    1. Precordial pain   2. Pure hypercholesterolemia   3. Aortic atherosclerosis (HCC)    PLAN:    In  order of problems listed above:  Chest pain, atypical features, overall improved with treatment of pneumonia.  Get echocardiogram.  No indication for stress testing at this time, unless echo shows gross abnormalities. Hyperlipidemia, obtain repeat fasting lipid profile. Aortic atherosclerosis, continue aspirin 81 mg, lipid profile as above.  Due to patient's age, unsure if statin therapy will be beneficial. Patient with arthritic pain, continue Tylenol, follow-up with PCP, consider rheum referral.  Follow-up after echo and lipid profile.     Medication Adjustments/Labs and Tests Ordered: Current medicines are reviewed at length with the patient today.  Concerns regarding medicines are outlined above.  Orders Placed This Encounter  Procedures   Lipid panel   EKG 12-Lead   ECHOCARDIOGRAM COMPLETE   No orders of the defined types were placed in this encounter.   Patient Instructions  Medication Instructions:   Your physician recommends that you continue on your current medications as directed. Please refer to the Current Medication list given to you today.  *If you need a refill on your cardiac medications before your next appointment, please call your pharmacy*   Lab Work:  Your physician recommends that you return for a FASTING lipid profile: at your earliest convenience.  - You will need to be fasting. Please do not have anything to eat or drink after midnight the morning you have the lab work. You may only have water or black coffee with no cream or sugar.    Please return to our office on _____________________at___________am/pm   Testing/Procedures:  Your physician has requested that you have an echocardiogram. Echocardiography is a painless test that uses sound waves to create images of your heart. It provides your doctor with information about the size and shape of your heart and how well your heart's chambers and valves are working. This procedure takes approximately one hour. There are no restrictions for this procedure.    Follow-Up: At [facility], you and your health needs are our priority.  As part of our continuing mission to provide you with exceptional heart care, we have created designated Provider Care Teams.  These Care Teams include your primary Cardiologist (physician) and Advanced Practice Providers (APPs -  Physician Assistants and Nurse Practitioners) who all work together to provide you with the care you need, when you need it.  We recommend signing up for the patient portal called "MyChart".  Sign up information is provided on this After Visit Summary.  MyChart is used to connect with patients for Virtual Visits (Telemedicine).  Patients are able to view lab/test results, encounter notes, upcoming appointments, etc.  Non-urgent messages can be sent to your provider as well.   To learn more about what you can do with MyChart, go to [email or web address].    Your next appointment:   Follow up after Echo   The format for your next appointment:   In Person  Provider:   Kate Sable, MD   Other Instructions     Signed, Kate Sable, MD  12/11/2020 12:41 PM    [place]

## 2020-12-16 ENCOUNTER — Other Ambulatory Visit: Payer: Self-pay

## 2020-12-16 ENCOUNTER — Other Ambulatory Visit (INDEPENDENT_AMBULATORY_CARE_PROVIDER_SITE_OTHER): Payer: Medicare Other

## 2020-12-16 DIAGNOSIS — E7800 Pure hypercholesterolemia, unspecified: Secondary | ICD-10-CM | POA: Diagnosis not present

## 2020-12-17 ENCOUNTER — Ambulatory Visit (INDEPENDENT_AMBULATORY_CARE_PROVIDER_SITE_OTHER): Payer: Medicare Other

## 2020-12-17 DIAGNOSIS — R072 Precordial pain: Secondary | ICD-10-CM

## 2020-12-17 LAB — ECHOCARDIOGRAM COMPLETE
AR max vel: 1.61 cm2
AV Area VTI: 1.64 cm2
AV Area mean vel: 1.71 cm2
AV Mean grad: 4 mmHg
AV Peak grad: 9.2 mmHg
Ao pk vel: 1.52 m/s
Area-P 1/2: 3.7 cm2
Calc EF: 56.7 %
S' Lateral: 3.1 cm
Single Plane A2C EF: 50.3 %
Single Plane A4C EF: 62.1 %

## 2020-12-17 LAB — LIPID PANEL
Chol/HDL Ratio: 3 ratio (ref 0.0–4.4)
Cholesterol, Total: 214 mg/dL — ABNORMAL HIGH (ref 100–199)
HDL: 72 mg/dL (ref >39)
LDL Chol Calc (NIH): 129 mg/dL — ABNORMAL HIGH (ref 0–99)
Triglycerides: 72 mg/dL (ref 0–149)
VLDL Cholesterol Cal: 13 mg/dL (ref 5–40)

## 2021-01-05 ENCOUNTER — Other Ambulatory Visit: Payer: Self-pay | Admitting: Family Medicine

## 2021-01-14 ENCOUNTER — Other Ambulatory Visit: Payer: Medicare Other

## 2021-01-19 ENCOUNTER — Ambulatory Visit: Payer: Medicare Other | Admitting: Cardiology

## 2021-01-19 ENCOUNTER — Encounter: Payer: Self-pay | Admitting: Cardiology

## 2021-01-19 ENCOUNTER — Other Ambulatory Visit: Payer: Self-pay

## 2021-01-19 VITALS — BP 120/72 | HR 81 | Ht 60.0 in | Wt 147.0 lb

## 2021-01-19 DIAGNOSIS — R072 Precordial pain: Secondary | ICD-10-CM | POA: Diagnosis not present

## 2021-01-19 DIAGNOSIS — I700 Atherosclerosis of aorta: Secondary | ICD-10-CM

## 2021-01-19 DIAGNOSIS — E7800 Pure hypercholesterolemia, unspecified: Secondary | ICD-10-CM | POA: Diagnosis not present

## 2021-01-19 DIAGNOSIS — M2550 Pain in unspecified joint: Secondary | ICD-10-CM | POA: Diagnosis not present

## 2021-01-19 NOTE — Patient Instructions (Signed)
Medication Instructions:  Your physician recommends that you continue on your current medications as directed. Please refer to the Current Medication list given to you today.  *If you need a refill on your cardiac medications before your next appointment, please call your pharmacy*   Lab Work: None ordered If you have labs (blood work) drawn today and your tests are completely normal, you will receive your results only by: [place] (if you have MyChart) OR A paper copy in the mail If you have any lab test that is abnormal or we need to change your treatment, we will call you to review the results.   Testing/Procedures: None ordereds   Follow-Up: At [facility], you and your health needs are our priority.  As part of our continuing mission to provide you with exceptional heart care, we have created designated Provider Care Teams.  These Care Teams include your primary Cardiologist (physician) and Advanced Practice Providers (APPs -  Physician Assistants and Nurse Practitioners) who all work together to provide you with the care you need, when you need it.  We recommend signing up for the patient portal called "MyChart".  Sign up information is provided on this After Visit Summary.  MyChart is used to connect with patients for Virtual Visits (Telemedicine).  Patients are able to view lab/test results, encounter notes, upcoming appointments, etc.  Non-urgent messages can be sent to your provider as well.   To learn more about what you can do with MyChart, go to [email or web address].    Your next appointment:   Follow up as needed   The format for your next appointment:   In Person  Provider:   You may see Dr. Garen Lah or one of the following Advanced Practice Providers on your designated Care Team:   Murray Hodgkins, NP Christell Faith, PA-C Cadence Kathlen Mody, [place]    Other Instructions

## 2021-01-19 NOTE — Progress Notes (Signed)
Cardiology Office Note:    Date:  01/19/2021   ID:  KASHARI CHALMERS, DOB 1934-07-14, MRN [id number]  PCP:  Abner Greenspan, MD   [facility] Providers Cardiologist:  None     Referring MD: Abner Greenspan, MD   Chief Complaint  Patient presents with   Other    Follow up post ECHO -- Meds reviewed verbally with patient.     History of Present Illness:    Erika Evans is a 86 y.o. female with a hx of asthma, hyperlipidemia who presents for follow-up.  She was last seen due to aortic calcification and atypical chest discomfort.    Echocardiogram was ordered to evaluate any significant abnormalities.  Presents for echocardiogram results.  Also has joint pains which were consistent with arthritis.  She has tenderness with making a fist, finger joints appear swollen.  Takes aspirin 81 mg daily due to aortic atherosclerosis.   Past Medical History:  Diagnosis Date   Asthma    Cataract 2019   resolved with surgery   Congenital fusion of cervical spine    Elevated liver enzymes    Foot pain    Left; nerve problem; procedure   GERD (gastroesophageal reflux disease)    Hepatitis B carrier (HCC)    Positive hep B test at [facility]   Hyperlipidemia    Mild   Osteoarthritis    Osteopenia    Pneumonia 2004    Past Surgical History:  Procedure Laterality Date   "Blocked kidney"     BACK SURGERY     CARDIOVASCULAR STRESS TEST  9/07   Neg   CATARACT EXTRACTION W/ INTRAOCULAR LENS IMPLANT Right 05/06/2017   Dr. Tommy Rainwater   CATARACT EXTRACTION W/ INTRAOCULAR LENS IMPLANT Left 05/20/2017   Dr. Tommy Rainwater   COLONOSCOPY  9/06   Diverticulosis, hems   COLONOSCOPY  04/04/2019   Dexa     Osteopenis 9/01, improved 10/03, decreased BMD 5/06   PARTIAL HYSTERECTOMY     UPPER GASTROINTESTINAL ENDOSCOPY  04/04/2019   US ECHOCARDIOGRAPHY  7/03   Abd-normal   US RENAL/AORTA  2007   Neg    Current Medications: Current Meds  Medication Sig   acetaminophen (TYLENOL) 325 MG tablet  Take 2 tablets (650 mg total) by mouth every 4 (four) hours as needed for mild pain or moderate pain (or Fever >/= 101).   albuterol (PROVENTIL HFA;VENTOLIN HFA) 108 (90 BASE) MCG/ACT inhaler Inhale 2 puffs into the lungs every 4 (four) hours as needed for wheezing or shortness of breath.   aspirin 81 MG tablet Take 81 mg by mouth daily.     Bepotastine Besilate 1.5 % SOLN Place 1 drop into both eyes as needed.   Cholecalciferol (VITAMIN D-3) 1000 UNITS CAPS Take 2 capsules by mouth daily.   fish oil-omega-3 fatty acids 1000 MG capsule Take 1 g by mouth 2 (two) times daily.    Fluticasone-Salmeterol (ADVAIR) 250-50 MCG/DOSE AEPB Inhale 1 puff into the lungs every 12 (twelve) hours.    levocetirizine (XYZAL) 5 MG tablet Take 5 mg by mouth at bedtime.   montelukast (SINGULAIR) 10 MG tablet Take 10 mg by mouth daily.       Allergies:   Alendronate sodium, Miacalcin [calcitonin (salmon)], Pepcid [famotidine], and Raloxifene   Social History   Socioeconomic History   Marital status: Widowed    Spouse name: Not on file   Number of children: 0   Years of education: Not on file   Highest  education level: Not on file  Occupational History    Employer: [employer]    Comment: Retired  Tobacco Use   Smoking status: Never   Smokeless tobacco: Never  Vaping Use   Vaping Use: Never used  Substance and Sexual Activity   Alcohol use: No    Alcohol/week: 0.0 standard drinks   Drug use: No   Sexual activity: Not Currently  Other Topics Concern   Not on file  Social History Narrative   Lost husband November 2004.   Lives alone   Is a twin (atually triplet but third (boy) died at birth)   Sometimes walks for exercise.   Retired [employer]   No children   Never smoker, No ETOH, rare caffeine   Social Determinants of Radio broadcast assistant Strain: Not on file  Food Insecurity: Not on file  Transportation Needs: Not on file  Physical Activity: Not on file  Stress: Not on file   Social Connections: Not on file     Family History: The patient's family history includes Cancer in her sister; Heart attack in her brother; Leukemia in her brother; Lung cancer in her father; Stroke in her brother. There is no history of Colon cancer, Colon polyps, Esophageal cancer, Rectal cancer, or Stomach cancer.  ROS:   Please see the history of present illness.     All other systems reviewed and are negative.  EKGs/Labs/Other Studies Reviewed:    The following studies were reviewed today:   EKG:  EKG not ordered today.   Recent Labs: 02/06/2020: ALT 22 10/24/2020: BUN 16; Potassium 3.8; Sodium 138 10/25/2020: Creatinine, Ser 0.65; Hemoglobin 13.0; Platelets 235  Recent Lipid Panel    Component Value Date/Time   CHOL 214 (H) 12/16/2020 0944   TRIG 72 12/16/2020 0944   HDL 72 12/16/2020 0944   CHOLHDL 3.0 12/16/2020 0944   CHOLHDL 4 12/20/2018 1058   VLDL 24.2 12/20/2018 1058   LDLCALC 129 (H) 12/16/2020 0944   LDLDIRECT 123.5 11/27/2012 1022     Risk Assessment/Calculations:          Physical Exam:    VS:  BP 120/72 (BP Location: Left Arm, Patient Position: Sitting, Cuff Size: Normal)   Pulse 81   Ht 5' (1.524 m)   Wt 147 lb (66.7 kg)   SpO2 97%   BMI 28.71 kg/m     Wt Readings from Last 3 Encounters:  01/19/21 147 lb (66.7 kg)  12/11/20 147 lb (66.7 kg)  11/05/20 145 lb (65.8 kg)     GEN:  Well nourished, well developed in no acute distress HEENT: Normal NECK: No JVD; No carotid bruits LYMPHATICS: No lymphadenopathy CARDIAC: RRR, no murmurs, rubs, gallops RESPIRATORY:  Clear to auscultation without rales, wheezing or rhonchi  ABDOMEN: Soft, non-tender, non-distended MUSCULOSKELETAL:  No edema; joint pain, Heberden's nodes noted in fingers SKIN: Warm and dry NEUROLOGIC:  Alert and oriented x 3 PSYCHIATRIC:  Normal affect   ASSESSMENT:    1. Precordial pain   2. Pure hypercholesterolemia   3. Aortic atherosclerosis ([place])   4. Arthralgia,  unspecified joint     PLAN:    In order of problems listed above:  Chest pain, atypical features, overall resolved with treatment of pneumonia.  Echo shows normal systolic and diastolic function, EF 55 to 60%. Hyperlipidemia, continue low-cholesterol diet.  Not recommending statins at this time due to patient's age. Aortic atherosclerosis, continue aspirin 81 mg. Patient with arthritic pain, will refer patient to rheumatology.  Follow-up as needed     Medication Adjustments/Labs and Tests Ordered: Current medicines are reviewed at length with the patient today.  Concerns regarding medicines are outlined above.  Orders Placed This Encounter  Procedures   Ambulatory referral to Rheumatology    No orders of the defined types were placed in this encounter.   Patient Instructions  Medication Instructions:  Your physician recommends that you continue on your current medications as directed. Please refer to the Current Medication list given to you today.  *If you need a refill on your cardiac medications before your next appointment, please call your pharmacy*   Lab Work: None ordered If you have labs (blood work) drawn today and your tests are completely normal, you will receive your results only by: [place] (if you have MyChart) OR A paper copy in the mail If you have any lab test that is abnormal or we need to change your treatment, we will call you to review the results.   Testing/Procedures: None ordereds   Follow-Up: At [facility], you and your health needs are our priority.  As part of our continuing mission to provide you with exceptional heart care, we have created designated Provider Care Teams.  These Care Teams include your primary Cardiologist (physician) and Advanced Practice Providers (APPs -  Physician Assistants and Nurse Practitioners) who all work together to provide you with the care you need, when you need it.  We recommend signing up for the  patient portal called "MyChart".  Sign up information is provided on this After Visit Summary.  MyChart is used to connect with patients for Virtual Visits (Telemedicine).  Patients are able to view lab/test results, encounter notes, upcoming appointments, etc.  Non-urgent messages can be sent to your provider as well.   To learn more about what you can do with MyChart, go to [email or web address].    Your next appointment:   Follow up as needed   The format for your next appointment:   In Person  Provider:   You may see Dr. Garen Lah or one of the following Advanced Practice Providers on your designated Care Team:   Murray Hodgkins, NP Christell Faith, PA-C Cadence Kathlen Mody, [place]    Other Instructions    Signed, Kate Sable, MD  01/19/2021 12:28 PM    [place]

## 2021-03-03 ENCOUNTER — Telehealth: Payer: Self-pay | Admitting: Family Medicine

## 2021-03-03 NOTE — Telephone Encounter (Signed)
Pt notified of Dr. Tower's instructions and verbalized understanding  

## 2021-03-03 NOTE — Telephone Encounter (Signed)
Pt called in stated she had the Flu 01/28/21 and wants to know can she still get the flu vaccine and Covid boosted . Please Advise [phone]

## 2021-03-03 NOTE — Telephone Encounter (Signed)
As long as she feels better please go ahead and get the flu shot  Get the new covid bivalent booster if her last shot was over 2 months ago

## 2021-03-05 ENCOUNTER — Other Ambulatory Visit: Payer: Self-pay | Admitting: Family Medicine

## 2021-03-11 ENCOUNTER — Other Ambulatory Visit: Payer: Self-pay

## 2021-03-11 ENCOUNTER — Ambulatory Visit: Payer: Self-pay

## 2021-03-11 ENCOUNTER — Telehealth: Payer: Self-pay | Admitting: Family Medicine

## 2021-03-11 ENCOUNTER — Ambulatory Visit: Payer: Medicare Other | Admitting: Internal Medicine

## 2021-03-11 ENCOUNTER — Encounter: Payer: Self-pay | Admitting: Internal Medicine

## 2021-03-11 VITALS — BP 127/71 | HR 82 | Resp 16 | Ht 60.0 in | Wt 149.0 lb

## 2021-03-11 DIAGNOSIS — M79641 Pain in right hand: Secondary | ICD-10-CM | POA: Diagnosis not present

## 2021-03-11 DIAGNOSIS — M1990 Unspecified osteoarthritis, unspecified site: Secondary | ICD-10-CM | POA: Diagnosis not present

## 2021-03-11 DIAGNOSIS — M154 Erosive (osteo)arthritis: Secondary | ICD-10-CM | POA: Insufficient documentation

## 2021-03-11 DIAGNOSIS — M2540 Effusion, unspecified joint: Secondary | ICD-10-CM | POA: Diagnosis not present

## 2021-03-11 DIAGNOSIS — M79642 Pain in left hand: Secondary | ICD-10-CM | POA: Diagnosis not present

## 2021-03-11 NOTE — Progress Notes (Signed)
Office Visit Note  Patient: Erika Evans             Date of Birth: [birth year]           MRN: [id number]             PCP: Abner Greenspan, MD Referring: Kate Sable, MD Visit Date: 03/11/2021  Subjective:  New Patient (Initial Visit) (Bil hand pain, total body joint tenderness)   History of Present Illness: Erika Evans is a 87 y.o. female here for joint pains with reported swelling and stiffness of finger joints.  She has known longstanding osteoarthritis of multiple sites which was previously discussed in her primary care clinic but more recently noticing an increase in symptoms particularly in bilateral hands.  She has had episodes of noticeable joint swelling that she has seen or also remarked by other providers.  Typically she has stiffness lasting up to a few minutes every morning affecting both her hands and lower extremities.  She does not take a lot of medications for symptoms mostly Tylenol as needed which is partially effective.  She is usually noticed some increase in symptoms with the cold or sudden weather changes currently doing pretty well today. She reports her brother was diagnosed with rheumatoid arthritis who was treated years ago with gold injections no other family history with autoimmune or other rheumatologic diseases.  Activities of Daily Living:  Patient reports morning stiffness for 5 minutes.   Patient Reports nocturnal pain.  Difficulty dressing/grooming: Denies Difficulty climbing stairs: Reports Difficulty getting out of chair: Reports Difficulty using hands for taps, buttons, cutlery, and/or writing: Denies  Review of Systems  Constitutional:  Positive for fatigue.  HENT:  Positive for mouth dryness.   Eyes:  Positive for dryness.  Respiratory:  Positive for shortness of breath.   Cardiovascular:  Positive for swelling in legs/feet.  Gastrointestinal:  Negative for constipation.  Endocrine: Positive for excessive thirst and increased  urination.  Genitourinary:  Negative for difficulty urinating.  Musculoskeletal:  Positive for joint pain, gait problem, joint pain, joint swelling, muscle weakness, morning stiffness and muscle tenderness.  Skin:  Negative for rash.  Allergic/Immunologic: Positive for susceptible to infections.  Neurological:  Positive for numbness and weakness.  Hematological:  Positive for bruising/bleeding tendency.  Psychiatric/Behavioral:  Positive for sleep disturbance.    PMFS History:  Patient Active Problem List   Diagnosis Date Noted   Joint swelling 03/11/2021   Dyspepsia 11/05/2020   Aortic atherosclerosis ([place]) 11/05/2020   Chest discomfort 11/05/2020   Community acquired pneumonia 10/25/2020   COVID-19 vaccine series declined 12/12/2019   Scalp itch 08/24/2019   Diarrhea 08/22/2019   Hypokalemia 08/22/2019   Positive fecal immunochemical test 02/15/2019   Venous insufficiency of both lower extremities 12/21/2017   Routine general medical examination at a health care facility 01/28/2015   Screening for colon cancer 01/28/2015   Estrogen deficiency 01/28/2015   Encounter for Medicare annual wellness exam 11/26/2012   GERD (gastroesophageal reflux disease) 04/21/2011   OA (osteoarthritis) 04/21/2011   Hyperlipidemia 04/21/2011   Osteopenia 04/21/2011   Obese 04/21/2011    Past Medical History:  Diagnosis Date   Asthma    Cataract 2019   resolved with surgery   Congenital fusion of cervical spine    Elevated liver enzymes    Foot pain    Left; nerve problem; procedure   GERD (gastroesophageal reflux disease)    Hepatitis B carrier (HCC)    Positive hep B test  at [facility]   Hyperlipidemia    Mild   Osteoarthritis    Osteopenia    Pneumonia 2004    Family History  Problem Relation Age of Onset   Lung cancer Father    Cancer Sister        Bladder   Dementia Sister    Stroke Brother    Leukemia Brother    Heart attack Brother    Colon cancer Neg Hx    Colon polyps  Neg Hx    Esophageal cancer Neg Hx    Rectal cancer Neg Hx    Stomach cancer Neg Hx    Past Surgical History:  Procedure Laterality Date   "Blocked kidney"     BACK SURGERY     CARDIOVASCULAR STRESS TEST  9/07   Neg   CATARACT EXTRACTION W/ INTRAOCULAR LENS IMPLANT Right 05/06/2017   Dr. Tommy Rainwater   CATARACT EXTRACTION W/ INTRAOCULAR LENS IMPLANT Left 05/20/2017   Dr. Tommy Rainwater   COLONOSCOPY  9/06   Diverticulosis, hems   COLONOSCOPY  04/04/2019   Dexa     Osteopenis 9/01, improved 10/03, decreased BMD 5/06   PARTIAL HYSTERECTOMY     UPPER GASTROINTESTINAL ENDOSCOPY  04/04/2019   US ECHOCARDIOGRAPHY  7/03   Abd-normal   US RENAL/AORTA  2007   Neg   Social History   Social History Narrative   Lost husband November 2004.   Lives alone   Is a twin (atually triplet but third (boy) died at birth)   Sometimes walks for exercise.   Retired [employer]   No children   Never smoker, No ETOH, rare caffeine   Immunization History  Administered Date(s) Administered   Fluad Quad(high Dose 65+) 12/12/2018   Influenza Split 12/10/2010, 12/03/2011   Influenza, High Dose Seasonal PF 12/13/2016   Influenza,inj,Quad PF,6+ Mos 11/27/2012, 12/11/2013, 01/01/2015, 12/12/2015, 12/14/2017, 12/12/2019   PFIZER(Purple Top)SARS-COV-2 Vaccination 12/23/2020   Pneumococcal Conjugate-13 01/28/2015   Pneumococcal Polysaccharide-23 04/21/2011, 12/23/2020   Td 04/21/2011   Zoster, Live 12/13/2013     Objective: Vital Signs: BP 127/71 (BP Location: Right Arm, Patient Position: Sitting, Cuff Size: Normal)    Pulse 82    Resp 16    Ht 5' (1.524 m)    Wt 149 lb (67.6 kg)    BMI 29.10 kg/m    Physical Exam Eyes:     Conjunctiva/sclera: Conjunctivae normal.  Cardiovascular:     Rate and Rhythm: Normal rate and regular rhythm.  Pulmonary:     Effort: Pulmonary effort is normal.     Breath sounds: Normal breath sounds.  Musculoskeletal:     Right lower leg: No edema.     Left lower leg: No  edema.  Skin:    General: Skin is warm and dry.     Findings: No rash.  Neurological:     Mental Status: She is alert.  Psychiatric:        Mood and Affect: Mood normal.     Musculoskeletal Exam:  Neck decreased extension ROM Shoulders slightly restricted abduction plus external rotation Increased thoracic kyphosis, scapulae and shoulder in mildly rolled forward posture Elbows full ROM no tenderness or swelling Wrists full ROM no tenderness or swelling Fingers full ROM bony enlargement of PIP and DIP joints, right thumb with hard but mobile nodule on inside edge of thumb Knees full ROM no tenderness or swelling Ankles full ROM no tenderness or swelling MTPs left worse than right bunions and lateral deviation, no swelling or  tenderness   Investigation: No additional findings.  Imaging: XR Hand 2 View Left  Result Date: 03/11/2021 X-ray left hand 2 views There are calcifications in the triangular fibrocartilage complex.  First [facility] joint degenerative changes with significant subluxation worse as compared to right hand thumb.  MCP joint spaces preserved mild lateral osteophyte of the second digit mild PIP joint degenerative changes most advanced in the fourth digit.  DIPs worst affected possible central erosions present particularly in the second finger.  Fifth DIP appears fused.  Bone mineralization with definite generalized osteopenia.  No erosions or abnormal calcifications seen. Impression Advanced 1st [facility] joint degenerative arthritis, severe OA and could be erosive OA with joint fusion and possible central erosion changes  XR Hand 2 View Right  Result Date: 03/11/2021 X-ray right hand 2 views Radiocarpal joint space appears normal.  Multiple cystic changes present in carpal bones.  Advanced degenerative change in the first [facility] joint with mild subluxation and first MCP hyperextension.  Significant narrowing of second and third MCP joint spaces with small lateral osteophytes.   Degenerative PIP joint changes worst in the third digit.  More advanced osteoarthritis of the DIP joints fourth DIP appears fused.  There is definite generalized osteopenia.  No erosions or abnormal calcifications are seen. Impression Advanced osteoarthritis of the hand, worst in 1st [facility] and in DIP joints also involving the 2nd-3rd MCPs   Recent Labs: Lab Results  Component Value Date   WBC 9.1 10/25/2020   HGB 13.0 10/25/2020   PLT 235 10/25/2020   NA 138 10/24/2020   K 3.8 10/24/2020   CL 100 10/24/2020   CO2 29 10/24/2020   GLUCOSE 146 (H) 10/24/2020   BUN 16 10/24/2020   CREATININE 0.65 10/25/2020   BILITOT 0.7 02/06/2020   ALKPHOS 96 02/06/2020   AST 22 02/06/2020   ALT 22 02/06/2020   PROT 7.6 02/06/2020   ALBUMIN 4.1 02/06/2020   CALCIUM 9.4 10/24/2020   GFRAA >60 08/20/2019    Speciality Comments: No specialty comments available.  Procedures:  No procedures performed Allergies: Alendronate sodium, Miacalcin [calcitonin (salmon)], Pepcid [famotidine], and Raloxifene   Assessment / Plan:     Visit Diagnoses: Joint swelling - Plan: XR Hand 2 View Right, XR Hand 2 View Left, Sedimentation rate, Rheumatoid factor, Cyclic citrul peptide antibody, IgG, C-reactive protein  She different describes episodic joint swelling and with previous physician exams as well.  We will check inflammatory markers also rheumatoid factor and CCP antibodies with the symptoms and the reported family history.  X-ray of bilateral hands obtained in clinic today definitely showing osteoarthritis possibly DIP joint erosive osteoarthritis of the hands which could explain episodic inflammatory features.  Plan to follow-up and discuss treatment options currently suspect more of symptoms coming from chronic degenerative changes.  Osteoarthritis, unspecified osteoarthritis type, unspecified site   Joint pain in multiple sites with several minutes of daily stiffness consistent with primary osteoarthritis.   Increased thoracic kyphosis large anterior and right-sided osteophytes seen on review of thoracic spine imaging from earlier this year.  Cervical spine mobility is also limited by prior fusion of 3 lower vertebral bodies.  There is probably some mechanical or myofascial component as well with the very forward posture and some soreness between the scapulae.  Orders: Orders Placed This Encounter  Procedures   XR Hand 2 View Right   XR Hand 2 View Left   Sedimentation rate   Rheumatoid factor   Cyclic citrul peptide antibody, IgG   C-reactive  protein   No orders of the defined types were placed in this encounter.   Follow-Up Instructions: Return in about 2 weeks (around 03/25/2021) for New pt OA/?RA f/u 2wks.   Collier Salina, MD  Note - This record has been created using [employer].  Chart creation errors have been sought, but may not always  have been located. Such creation errors do not reflect on  the standard of medical care.

## 2021-03-11 NOTE — Telephone Encounter (Signed)
LVM for pt to tn my call to schedule AWV with [facility]

## 2021-03-12 LAB — C-REACTIVE PROTEIN: CRP: 3.3 mg/L (ref <8.0)

## 2021-03-12 LAB — SEDIMENTATION RATE: Sed Rate: 6 mm/h (ref 0–30)

## 2021-03-12 LAB — RHEUMATOID FACTOR: Rheumatoid fact SerPl-aCnc: <14 IU/mL (ref <14)

## 2021-03-12 LAB — CYCLIC CITRUL PEPTIDE ANTIBODY, IGG: Cyclic Citrullin Peptide Ab: <16 UNITS

## 2021-03-20 ENCOUNTER — Other Ambulatory Visit: Payer: Self-pay | Admitting: Family Medicine

## 2021-03-20 DIAGNOSIS — Z1231 Encounter for screening mammogram for malignant neoplasm of breast: Secondary | ICD-10-CM

## 2021-03-27 NOTE — Progress Notes (Signed)
Subjective:   Erika Evans is a 87 y.o. female who presents for Medicare Annual (Subsequent) preventive examination.  I connected with Kristy Schomburg today by telephone and verified that I am speaking with the correct person using two identifiers. Location patient: home Location provider: work Persons participating in the virtual visit: patient, Marine scientist.    I discussed the limitations, risks, security and privacy concerns of performing an evaluation and management service by telephone and the availability of in person appointments. I also discussed with the patient that there may be a patient responsible charge related to this service. The patient expressed understanding and verbally consented to this telephonic visit.    Interactive audio and video telecommunications were attempted between this provider and patient, however failed, due to patient having technical difficulties OR patient did not have access to video capability.  We continued and completed visit with audio only.  Some vital signs may be absent or patient reported.   Time Spent with patient on telephone encounter: 25 minutes  Review of Systems     Cardiac Risk Factors include: advanced age (>66men, >75 women);dyslipidemia     Objective:    Today's Vitals   03/30/21 0857  Weight: 147 lb (66.7 kg)  Height: 5' (1.524 m)   Body mass index is 28.71 kg/m.  Advanced Directives 03/30/2021 10/25/2020 10/24/2020 08/20/2019 12/20/2018 12/14/2017 12/13/2016  Does Patient Have a Medical Advance Directive? No Yes Yes No Yes No Yes  Type of Advance Directive - Home;Living will Healthcare Power of [place];Living will - [place];Living will  Does patient want to make changes to medical advance directive? - No - Patient declined No - Patient declined - - - -  Copy of [place] in Chart? - No - copy requested No - copy requested - No - copy  requested - No - copy requested  Would patient like information on creating a medical advance directive? Yes (MAU/Ambulatory/Procedural Areas - Information given) No - Patient declined - - - No - Patient declined -    Current Medications (verified) Outpatient Encounter Medications as of 03/30/2021  Medication Sig   acetaminophen (TYLENOL) 325 MG tablet Take 2 tablets (650 mg total) by mouth every 4 (four) hours as needed for mild pain or moderate pain (or Fever >/= 101).   albuterol (PROVENTIL HFA;VENTOLIN HFA) 108 (90 BASE) MCG/ACT inhaler Inhale 2 puffs into the lungs every 4 (four) hours as needed for wheezing or shortness of breath.   aspirin 81 MG tablet Take 81 mg by mouth daily.     Bepotastine Besilate 1.5 % SOLN Place 1 drop into both eyes as needed.   Cholecalciferol (VITAMIN D-3) 1000 UNITS CAPS Take 2 capsules by mouth daily.   fish oil-omega-3 fatty acids 1000 MG capsule Take 1 g by mouth 2 (two) times daily.    Fluticasone-Salmeterol (ADVAIR) 250-50 MCG/DOSE AEPB Inhale 1 puff into the lungs every 12 (twelve) hours.    levocetirizine (XYZAL) 5 MG tablet Take 5 mg by mouth at bedtime.   montelukast (SINGULAIR) 10 MG tablet Take 10 mg by mouth daily.     omeprazole (PRILOSEC) 20 MG capsule Take 20 mg by mouth daily.   No facility-administered encounter medications on file as of 03/30/2021.    Allergies (verified) Alendronate sodium, Miacalcin [calcitonin (salmon)], Pepcid [famotidine], and Raloxifene   History: Past Medical History:  Diagnosis Date   Asthma    Cataract 2019  resolved with surgery   Congenital fusion of cervical spine    Elevated liver enzymes    Foot pain    Left; nerve problem; procedure   GERD (gastroesophageal reflux disease)    Hepatitis B carrier (HCC)    [facility] hep B [facility] at [facility]   Hyperlipidemia    Mild   Osteoarthritis    Osteopenia    Pneumonia 2004   Past Surgical History:  Procedure Laterality Date   "Blocked kidney"     BACK  SURGERY     CARDIOVASCULAR STRESS TEST  9/07   Neg   CATARACT EXTRACTION W/ INTRAOCULAR LENS IMPLANT Right 05/06/2017   Dr. Tommy Rainwater   CATARACT EXTRACTION W/ INTRAOCULAR LENS IMPLANT Left 05/20/2017   Dr. Tommy Rainwater   COLONOSCOPY  9/06   Diverticulosis, hems   COLONOSCOPY  04/04/2019   Dexa     Osteopenis 9/01, improved 10/03, decreased BMD 5/06   PARTIAL HYSTERECTOMY     UPPER GASTROINTESTINAL ENDOSCOPY  04/04/2019   US ECHOCARDIOGRAPHY  7/03   Abd-normal   US RENAL/AORTA  2007   Neg   Family History  Problem Relation Age of Onset   Lung cancer Father    Cancer Sister        Bladder   Dementia Sister    Stroke Brother    Leukemia Brother    Heart attack Brother    Colon cancer Neg Hx    Colon polyps Neg Hx    Esophageal cancer Neg Hx    Rectal cancer Neg Hx    Stomach cancer Neg Hx    Social History   Socioeconomic History   Marital status: Widowed    Spouse name: Not on file   Number of children: 0   Years of education: Not on file   Highest education level: Not on file  Occupational History    Employer: [employer]    Comment: Retired  Tobacco Use   Smoking status: Never   Smokeless tobacco: Never  Vaping Use   Vaping Use: Never used  Substance and Sexual Activity   Alcohol use: No    Alcohol/week: 0.0 standard drinks   Drug use: No   Sexual activity: Not Currently  Other Topics Concern   Not on file  Social History Narrative   Lost husband November 2004.   Lives alone   Is a twin (atually triplet but third (boy) died at birth)   Sometimes walks for exercise.   Retired [employer]   No children   Never smoker, No ETOH, rare caffeine   Social Determinants of Radio broadcast assistant Strain: Low Risk    Difficulty of Paying Living Expenses: Not hard at all  Food Insecurity: No Food Insecurity   Worried About Charity fundraiser in the Last Year: Never true   Arboriculturist in the Last Year: Never true  Transportation Needs: No  Transportation Needs   Lack of Transportation (Medical): No   Lack of Transportation (Non-Medical): No  Physical Activity: Inactive   Days of Exercise per Week: 0 days   Minutes of Exercise per Session: 0 min  Stress: No Stress Concern Present   Feeling of Stress : Only a little  Social Connections: Moderately Isolated   Frequency of Communication with Friends and Family: More than three times a week   Frequency of Social Gatherings with Friends and Family: Once a week   Attends Religious Services: More than 4 times per year  Active Member of Clubs or Organizations: No   Attends Archivist Meetings: Never   Marital Status: Widowed    Tobacco Counseling Counseling given: Not Answered   Clinical Intake:  Pre-visit preparation completed: Yes  Pain : No/denies pain     BMI - recorded: 28.71 Nutritional Status: BMI 25 -29 Overweight Nutritional Risks: None Diabetes: No  How often do you need to have someone help you when you read instructions, pamphlets, or other written materials from your doctor or pharmacy?: 1 - Never  Diabetic? No  Interpreter Needed?: No  Information entered by :: Orrin Brigham LPN   Activities of Daily Living In your present state of health, do you have any difficulty performing the following activities: 03/30/2021 10/25/2020  Hearing? N N  Vision? N N  Difficulty concentrating or making decisions? N Y  Walking or climbing stairs? N N  Dressing or bathing? N N  Doing errands, shopping? N N  Preparing Food and eating ? N -  Using the Toilet? N -  In the past six months, have you accidently leaked urine? Y -  Comment wears pad -  Do you have problems with loss of bowel control? Y -  Comment occasional -  Managing your Medications? N -  Managing your Finances? N -  Housekeeping or managing your Housekeeping? N -  Some recent data might be hidden    Patient Care Team: Tower, Wynelle Fanny, MD as PCP - General  Indicate any recent  Medical Services you may have received from other than [facility] providers in the past year (date may be approximate).     Assessment:   This is a routine wellness examination for [facility].  Hearing/Vision screen Hearing Screening - Comments:: No issues Vision Screening - Comments:: Last exam 2022, Dr. Rick Duff, wears readers  Dietary issues and exercise activities discussed: Current Exercise Habits: The patient does not participate in regular exercise at present   Goals Addressed             This Visit's Progress    Patient Stated       Would like to lose some weight       Depression Screen PHQ 2/9 Scores 03/30/2021 12/20/2018 12/14/2017 12/13/2016 01/28/2015 12/04/2012  PHQ - 2 Score 1 0 2 0 0 0  PHQ- 9 Score - 0 2 0 - -    Fall Risk Fall Risk  03/30/2021 12/20/2018 12/14/2017 12/13/2016 01/28/2015  Falls in the past year? 0 0 No No No  Number falls in past yr: 0 - - - -  Injury with Fall? 0 - - - -  Risk for fall due to : - Medication side effect;Impaired balance/gait - - -  Follow up Falls prevention discussed Falls evaluation completed;Falls prevention discussed - - -    FALL RISK PREVENTION PERTAINING TO THE HOME:  Any stairs in or around the home? Yes  If so, are there any without handrails? No  Home free of loose throw rugs in walkways, pet beds, electrical cords, etc? Yes  Adequate lighting in your home to reduce risk of falls? Yes   ASSISTIVE DEVICES UTILIZED TO PREVENT FALLS:  Life alert? No  Use of a cane, walker or w/c? No  Grab bars in the bathroom? No  Shower chair or bench in shower? Yes  Elevated toilet seat or a handicapped toilet? No   TIMED UP AND GO:  Was the test performed? No .    Cognitive Function: Normal cognitive status assessed  by  this Nurse Health Advisor. No abnormalities found.   MMSE - Mini Mental State Exam 12/20/2018 12/14/2017 12/13/2016  Orientation to time 5 5 5   Orientation to Place 5 5 5   Registration 3 3 3   Attention/  Calculation 5 0 0  Recall 3 3 3   Language- name 2 objects - 0 0  Language- repeat 1 1 1   Language- follow 3 step command - 3 3  Language- read & follow direction - 0 0  Write a sentence - 0 0  Copy design - 0 0  Total score - 20 20        Immunizations Immunization History  Administered Date(s) Administered   Fluad Quad(high Dose 65+) 12/12/2018   Influenza Split 12/10/2010, 12/03/2011   Influenza, High Dose Seasonal PF 12/13/2016   Influenza,inj,Quad PF,6+ Mos 11/27/2012, 12/11/2013, 01/01/2015, 12/12/2015, 12/14/2017, 12/12/2019   Influenza-Unspecified 03/12/2020   PFIZER(Purple Top)SARS-COV-2 Vaccination 12/23/2020   Pneumococcal Conjugate-13 01/28/2015   Pneumococcal Polysaccharide-23 04/21/2011, 12/23/2020   Td 04/21/2011   Zoster, Live 12/13/2013    TDAP status: Up to date  Flu Vaccine status: Up to date  Pneumococcal vaccine status: Up to date  Covid-19 vaccine status: Information provided on how to obtain vaccines.   Qualifies for Shingles Vaccine? Yes   Zostavax completed Yes   Shingrix Completed?: No.    Education has been provided regarding the importance of this vaccine. Patient has been advised to call insurance company to determine out of pocket expense if they have not yet received this vaccine. Advised may also receive vaccine at local pharmacy or Health Dept. Verbalized acceptance and understanding.  Screening Tests Health Maintenance  Topic Date Due   Zoster Vaccines- Shingrix (1 of 2) Never done   INFLUENZA VACCINE  10/06/2020   COVID-19 Vaccine (2 - Pfizer series) 01/13/2021   MAMMOGRAM  02/26/2021   TETANUS/TDAP  04/20/2021   Pneumonia Vaccine 26+ Years old  Completed   DEXA SCAN  Completed   HPV VACCINES  Aged Out    Health Maintenance  Health Maintenance Due  Topic Date Due   Zoster Vaccines- Shingrix (1 of 2) Never done   INFLUENZA VACCINE  10/06/2020   COVID-19 Vaccine (2 - Pfizer series) 01/13/2021   MAMMOGRAM  02/26/2021     Colorectal cancer screening: No longer required.   Mammogram status: due, last completed 02/27/20, patient scheduled for 04/14/21  Bone Density: due, last completed 02/10/17, patient plans on discussing with PCP  Lung Cancer Screening: (Low Dose CT Chest recommended if Age 57-80 years, 30 pack-year currently smoking OR have quit w/in 15years.) does not qualify.     Additional Screening:  Hepatitis C Screening: does not qualify  Vision Screening: Recommended annual ophthalmology exams for early detection of glaucoma and other disorders of the eye. Is the patient up to date with their annual eye exam?  Yes  Who is the provider or what is the name of the office in which the patient attends annual eye exams? Dr. Rick Duff   Dental Screening: Recommended annual dental exams for proper oral hygiene  Community Resource Referral / Chronic Care Management: CRR required this visit?  No   CCM required this visit?  No      Plan:     I have personally reviewed and noted the following in the patients chart:   Medical and social history Use of alcohol, tobacco or illicit drugs  Current medications and supplements including opioid prescriptions.  Functional ability and status Nutritional status Physical activity  Advanced directives List of other physicians Hospitalizations, surgeries, and ER visits in previous 12 months Vitals Screenings to include cognitive, depression, and falls Referrals and appointments  In addition, I have reviewed and discussed with patient certain preventive protocols, quality metrics, and best practice recommendations. A written personalized care plan for preventive services as well as general preventive health recommendations were provided to patient.   Due to this being a telephonic visit, the after visit summary with patients personalized plan was offered to patient via mail or my-chart. Patient would like to access on my-chart.    Loma Messing,  LPN   [phone]   Nurse Health Advisor  Nurse Notes: none

## 2021-03-29 NOTE — Progress Notes (Signed)
Office Visit Note  Patient: Erika Evans             Date of Birth: November 01, 1934           MRN: [id number]             PCP: Abner Greenspan, MD Referring: Tower, Wynelle Fanny, MD Visit Date: 03/30/2021   Subjective:  Follow-up   History of Present Illness: Erika Evans is a 87 y.o. female here for follow up with joint pain and swelling in multiple fingers. Labs at initial visit were negative for RA associated antibodies or inflammatory markers. Xrays showing considerable OA changes with possible erosions at distal finger joints.  Previous HPI 03/11/21 Erika Evans is a 87 y.o. female here for joint pains with reported swelling and stiffness of finger joints.  She has known longstanding osteoarthritis of multiple sites which was previously discussed in her primary care clinic but more recently noticing an increase in symptoms particularly in bilateral hands.  She has had episodes of noticeable joint swelling that she has seen or also remarked by other providers.  Typically she has stiffness lasting up to a few minutes every morning affecting both her hands and lower extremities.  She does not take a lot of medications for symptoms mostly Tylenol as needed which is partially effective.  She is usually noticed some increase in symptoms with the cold or sudden weather changes currently doing pretty well today. She reports her brother was diagnosed with rheumatoid arthritis who was treated years ago with gold injections no other family history with autoimmune or other rheumatologic diseases.   Review of Systems  Constitutional:  Negative for fever.  Musculoskeletal:  Positive for joint pain, joint pain and joint swelling. Negative for muscle weakness.  Neurological:  Negative for numbness.   PMFS History:  Patient Active Problem List   Diagnosis Date Noted   Erosive osteoarthritis of both hands 03/11/2021   Dyspepsia 11/05/2020   Aortic atherosclerosis ([place]) 11/05/2020   Chest  discomfort 11/05/2020   Community acquired pneumonia 10/25/2020   COVID-19 vaccine series declined 12/12/2019   Scalp itch 08/24/2019   Diarrhea 08/22/2019   Hypokalemia 08/22/2019   Positive fecal immunochemical test 02/15/2019   Venous insufficiency of both lower extremities 12/21/2017   Routine general medical examination at a health care facility 01/28/2015   Screening for colon cancer 01/28/2015   Estrogen deficiency 01/28/2015   Encounter for Medicare annual wellness exam 11/26/2012   GERD (gastroesophageal reflux disease) 04/21/2011   OA (osteoarthritis) 04/21/2011   Hyperlipidemia 04/21/2011   Osteopenia 04/21/2011   Obese 04/21/2011    Past Medical History:  Diagnosis Date   Asthma    Cataract 2019   resolved with surgery   Congenital fusion of cervical spine    Elevated liver enzymes    Foot pain    Left; nerve problem; procedure   GERD (gastroesophageal reflux disease)    Hepatitis B carrier (HCC)    Positive hep B test at [facility]   Hyperlipidemia    Mild   Osteoarthritis    Osteopenia    Pneumonia 2004    Family History  Problem Relation Age of Onset   Lung cancer Father    Cancer Sister        Bladder   Dementia Sister    Stroke Brother    Leukemia Brother    Heart attack Brother    Colon cancer Neg Hx    Colon polyps Neg Hx  Esophageal cancer Neg Hx    Rectal cancer Neg Hx    Stomach cancer Neg Hx    Past Surgical History:  Procedure Laterality Date   "Blocked kidney"     BACK SURGERY     CARDIOVASCULAR STRESS TEST  9/07   Neg   CATARACT EXTRACTION W/ INTRAOCULAR LENS IMPLANT Right 05/06/2017   Dr. Tommy Rainwater   CATARACT EXTRACTION W/ INTRAOCULAR LENS IMPLANT Left 05/20/2017   Dr. Tommy Rainwater   COLONOSCOPY  9/06   Diverticulosis, hems   COLONOSCOPY  04/04/2019   Dexa     Osteopenis 9/01, improved 10/03, decreased BMD 5/06   PARTIAL HYSTERECTOMY     UPPER GASTROINTESTINAL ENDOSCOPY  04/04/2019   US ECHOCARDIOGRAPHY  7/03   Abd-normal   US  RENAL/AORTA  2007   Neg   Social History   Social History Narrative   Lost husband November 2004.   Lives alone   Is a twin (atually triplet but third (boy) died at birth)   Sometimes walks for exercise.   Retired [employer]/ [employer]   No children   Never smoker, No ETOH, rare caffeine   Immunization History  Administered Date(s) Administered   Fluad Quad(high Dose 65+) 12/12/2018   Influenza Split 12/10/2010, 12/03/2011   Influenza, High Dose Seasonal PF 12/13/2016   Influenza,inj,Quad PF,6+ Mos 11/27/2012, 12/11/2013, 01/01/2015, 12/12/2015, 12/14/2017, 12/12/2019   Influenza-Unspecified 03/12/2020   PFIZER(Purple Top)SARS-COV-2 Vaccination 12/23/2020   Pneumococcal Conjugate-13 01/28/2015   Pneumococcal Polysaccharide-23 04/21/2011, 12/23/2020   Td 04/21/2011   Zoster, Live 12/13/2013     Objective: Vital Signs: BP (!) 148/86    Pulse 83    Ht 5' (1.524 m)    Wt 148 lb 9.6 oz (67.4 kg)    BMI 29.02 kg/m    Physical Exam Skin:    General: Skin is warm and dry.  Neurological:     Mental Status: She is alert.  Psychiatric:        Mood and Affect: Mood normal.     Musculoskeletal Exam:  Elbows full ROM no tenderness or swelling Wrists full ROM no tenderness or swelling Fingers mild squaring of bilateral 1st CMC joints, first MCP joint hyperextension, MCP and PIP joints mostly preserved some enlargement and decreased flexion range of motion in second MCP, there are prominent DIP bony nodules present in most digits worst advanced in the right third and fourth DIPs and the left third and fifth DIPs with almost no range of motion remaining, no focal tenderness or palpable synovitis   Investigation: No additional findings.  Imaging: XR Hand 2 View Left  Result Date: 03/11/2021 X-ray left hand 2 views There are calcifications in the triangular fibrocartilage complex.  First [facility] joint degenerative changes with significant subluxation worse as compared to right hand thumb.   MCP joint spaces preserved mild lateral osteophyte of the second digit mild PIP joint degenerative changes most advanced in the fourth digit.  DIPs worst affected possible central erosions present particularly in the second finger.  Fifth DIP appears fused.  Bone mineralization with definite generalized osteopenia.  No erosions or abnormal calcifications seen. Impression Advanced 1st [place] joint degenerative arthritis, severe OA and could be erosive OA with joint fusion and possible central erosion changes  XR Hand 2 View Right  Result Date: 03/11/2021 X-ray right hand 2 views Radiocarpal joint space appears normal.  Multiple cystic changes present in carpal bones.  Advanced degenerative change in the first [facility] joint with mild subluxation and first MCP hyperextension.  Significant narrowing of second and third MCP joint spaces with small lateral osteophytes.  Degenerative PIP joint changes worst in the third digit.  More advanced osteoarthritis of the DIP joints fourth DIP appears fused.  There is definite generalized osteopenia.  No erosions or abnormal calcifications are seen. Impression Advanced osteoarthritis of the hand, worst in 1st [facility] and in DIP joints also involving the 2nd-3rd MCPs   Recent Labs: Lab Results  Component Value Date   WBC 9.1 10/25/2020   HGB 13.0 10/25/2020   PLT 235 10/25/2020   NA 138 10/24/2020   K 3.8 10/24/2020   CL 100 10/24/2020   CO2 29 10/24/2020   GLUCOSE 146 (H) 10/24/2020   BUN 16 10/24/2020   CREATININE 0.65 10/25/2020   BILITOT 0.7 02/06/2020   ALKPHOS 96 02/06/2020   AST 22 02/06/2020   ALT 22 02/06/2020   PROT 7.6 02/06/2020   ALBUMIN 4.1 02/06/2020   CALCIUM 9.4 10/24/2020   GFRAA >60 08/20/2019    Speciality Comments: No specialty comments available.  Procedures:  No procedures performed Allergies: Alendronate sodium, Miacalcin [calcitonin (salmon)], Pepcid [famotidine], and Raloxifene   Assessment / Plan:     Visit Diagnoses:  Osteoarthritis, unspecified osteoarthritis type, unspecified site  Her findings and imaging are all consistent with erosive osteoarthritis of the hands.  We discussed in more detail this disease and prognosis.  Unfortunately there are no current disease modifying treatments for this so symptomatic management was discussed.  Reviewed treatment options including oral NSAIDs, topical medication, oral supplements, supportive braces for thumb symptoms, and that options for occupational therapy referral if she develops worsening function or weakness or local joint injections or surgical evaluation if symptoms are to refractory with the above options. She does not any scheduled follow-up with this clinic due to no new definitive treatments.  Orders: No orders of the defined types were placed in this encounter.  No orders of the defined types were placed in this encounter.    Follow-Up Instructions: No follow-ups on file.   Collier Salina, MD  Note - This record has been created using [employer].  Chart creation errors have been sought, but may not always  have been located. Such creation errors do not reflect on  the standard of medical care.

## 2021-03-30 ENCOUNTER — Other Ambulatory Visit: Payer: Self-pay

## 2021-03-30 ENCOUNTER — Encounter: Payer: Self-pay | Admitting: Internal Medicine

## 2021-03-30 ENCOUNTER — Ambulatory Visit (INDEPENDENT_AMBULATORY_CARE_PROVIDER_SITE_OTHER): Payer: Medicare Other

## 2021-03-30 ENCOUNTER — Ambulatory Visit: Payer: Medicare Other | Admitting: Internal Medicine

## 2021-03-30 VITALS — Ht 60.0 in | Wt 147.0 lb

## 2021-03-30 VITALS — BP 148/86 | HR 83 | Ht 60.0 in | Wt 148.6 lb

## 2021-03-30 DIAGNOSIS — Z0000 Encounter for general adult medical examination without abnormal findings: Secondary | ICD-10-CM | POA: Diagnosis not present

## 2021-03-30 DIAGNOSIS — M1990 Unspecified osteoarthritis, unspecified site: Secondary | ICD-10-CM

## 2021-03-30 NOTE — Patient Instructions (Addendum)
Your symptoms and imaging is consistent with erosive osteoarthritis of the hand. You do not have any signs of rheumatoid arthritis.   For osteoarthritis of the hand several treatments may be beneficial: - Topical antiinflammatory medicine such as diclofenac or Voltaren can be applied to  affected area as needed but may be less effective than oral antiinflammatory medicine. Topical analgesics containing CBD, menthol, or lidocaine can be tried.  - Oral nonsteroidal antiinflammatory medication such as ibuprofen, aleve, celebrex, or mobic are very helpful for osteoarthritis but can cause side effects such as stomach ulcers or hypertension with prolonged use. These should be taken intermittently or as needed, and always taken with food.  - Other oral supplements such as glucosamine chondroitin containing OTC treatments do not have strong data supporting effectiveness but can be helpful for some individuals and have no major side effects. Turmeric has some antiinflammatory effect similar to NSAID medications and may help, if taken as a supplement should not be taken above recommended doses.  - Compressive gloves can be helpful to support the thumb joint especially if hurting with certain activities.  - Occupational therapy referral can discuss exercises or activity modification to improve symptoms or strength if needed.  - Local steroid injection is an option if symptoms become worse and not controlled by the above options.

## 2021-03-30 NOTE — Patient Instructions (Signed)
Erika Evans , Thank you for taking time to complete your Medicare Wellness Visit. I appreciate your ongoing commitment to your health goals. Please review the following plan we discussed and let me know if I can assist you in the future.   Screening recommendations/referrals: Colonoscopy: no longer required  Mammogram: due, last completed 02/27/20, appointment scheduled 04/14/21 Bone Density: due, last completed 02/10/17, you plan to discuss with PCP Recommended yearly ophthalmology/optometry visit for glaucoma screening and checkup Recommended yearly dental visit for hygiene and checkup  Vaccinations: Influenza vaccine: up to date Pneumococcal vaccine: up date Tdap vaccine: due, last completed 04/21/11, Discuss with pharmacy Shingles vaccine: Discuss with pharmacy   Covid-19: newest booster available at your local pharmacy  Advanced directives: Please bring a copy of Living Will and/or Healthcare Power of Attorney for your chart.   Conditions/risks identified: see problem list  Next appointment: Follow up in one year for your annual wellness visit 03/31/22 @ 11:15am, this is a telephone visit   Preventive Care 65 Years and Older, Female Preventive care refers to lifestyle choices and visits with your health care provider that can promote health and wellness. What does preventive care include? A yearly physical exam. This is also called an annual well check. Dental exams once or twice a year. Routine eye exams. Ask your health care provider how often you should have your eyes checked. Personal lifestyle choices, including: Daily care of your teeth and gums. Regular physical activity. Eating a healthy diet. Avoiding tobacco and drug use. Limiting alcohol use. Practicing safe sex. Taking low-dose aspirin every day. Taking vitamin and mineral supplements as recommended by your health care provider. What happens during an annual well check? The services and screenings done by your  health care provider during your annual well check will depend on your age, overall health, lifestyle risk factors, and family history of disease. Counseling  Your health care provider may ask you questions about your: Alcohol use. Tobacco use. Drug use. Emotional well-being. Home and relationship well-being. Sexual activity. Eating habits. History of falls. Memory and ability to understand (cognition). Work and work Statistician. Reproductive health. Screening  You may have the following tests or measurements: Height, weight, and BMI. Blood pressure. Lipid and cholesterol levels. These may be checked every 5 years, or more frequently if you are over 54 years old. Skin check. Lung cancer screening. You may have this screening every year starting at age 85 if you have a 30-pack-year history of smoking and currently smoke or have quit within the past 15 years. Fecal occult blood test (FOBT) of the stool. You may have this test every year starting at age 85. Flexible sigmoidoscopy or colonoscopy. You may have a sigmoidoscopy every 5 years or a colonoscopy every 10 years starting at age 85. Hepatitis C blood test. Hepatitis B blood test. Sexually transmitted disease (STD) testing. Diabetes screening. This is done by checking your blood sugar (glucose) after you have not eaten for a while (fasting). You may have this done every 1-3 years. Bone density scan. This is done to screen for osteoporosis. You may have this done starting at age 85. Mammogram. This may be done every 1-2 years. Talk to your health care provider about how often you should have regular mammograms. Talk with your health care provider about your test results, treatment options, and if necessary, the need for more tests. Vaccines  Your health care provider may recommend certain vaccines, such as: Influenza vaccine. This is recommended every year. Tetanus, diphtheria,  and acellular pertussis (Tdap, Td) vaccine. You may  need a Td booster every 10 years. Zoster vaccine. You may need this after age 75. Pneumococcal 13-valent conjugate (PCV13) vaccine. One dose is recommended after age 78. Pneumococcal polysaccharide (PPSV23) vaccine. One dose is recommended after age 68. Talk to your health care provider about which screenings and vaccines you need and how often you need them. This information is not intended to replace advice given to you by your health care provider. Make sure you discuss any questions you have with your health care provider. Document Released: 03/21/2015 Document Revised: 11/12/2015 Document Reviewed: 12/24/2014 Elsevier Interactive Patient Education  2017 [place] Prevention in the Home Falls can cause injuries. They can happen to people of all ages. There are many things you can do to make your home safe and to help prevent falls. What can I do on the outside of my home? Regularly fix the edges of walkways and driveways and fix any cracks. Remove anything that might make you trip as you walk through a door, such as a raised step or threshold. Trim any bushes or trees on the path to your home. Use bright outdoor lighting. Clear any walking paths of anything that might make someone trip, such as rocks or tools. Regularly check to see if handrails are loose or broken. Make sure that both sides of any steps have handrails. Any raised decks and porches should have guardrails on the edges. Have any leaves, snow, or ice cleared regularly. Use sand or salt on walking paths during winter. Clean up any spills in your garage right away. This includes oil or grease spills. What can I do in the bathroom? Use night lights. Install grab bars by the toilet and in the tub and shower. Do not use towel bars as grab bars. Use non-skid mats or decals in the tub or shower. If you need to sit down in the shower, use a plastic, non-slip stool. Keep the floor dry. Clean up any water that spills on  the floor as soon as it happens. Remove soap buildup in the tub or shower regularly. Attach bath mats securely with double-sided non-slip rug tape. Do not have throw rugs and other things on the floor that can make you trip. What can I do in the bedroom? Use night lights. Make sure that you have a light by your bed that is easy to reach. Do not use any sheets or blankets that are too big for your bed. They should not hang down onto the floor. Have a firm chair that has side arms. You can use this for support while you get dressed. Do not have throw rugs and other things on the floor that can make you trip. What can I do in the kitchen? Clean up any spills right away. Avoid walking on wet floors. Keep items that you use a lot in easy-to-reach places. If you need to reach something above you, use a strong step stool that has a grab bar. Keep electrical cords out of the way. Do not use floor polish or wax that makes floors slippery. If you must use wax, use non-skid floor wax. Do not have throw rugs and other things on the floor that can make you trip. What can I do with my stairs? Do not leave any items on the stairs. Make sure that there are handrails on both sides of the stairs and use them. Fix handrails that are broken or loose. Make sure  that handrails are as long as the stairways. Check any carpeting to make sure that it is firmly attached to the stairs. Fix any carpet that is loose or worn. Avoid having throw rugs at the top or bottom of the stairs. If you do have throw rugs, attach them to the floor with carpet tape. Make sure that you have a light switch at the top of the stairs and the bottom of the stairs. If you do not have them, ask someone to add them for you. What else can I do to help prevent falls? Wear shoes that: Do not have high heels. Have rubber bottoms. Are comfortable and fit you well. Are closed at the toe. Do not wear sandals. If you use a stepladder: Make sure  that it is fully opened. Do not climb a closed stepladder. Make sure that both sides of the stepladder are locked into place. Ask someone to hold it for you, if possible. Clearly mark and make sure that you can see: Any grab bars or handrails. First and last steps. Where the edge of each step is. Use tools that help you move around (mobility aids) if they are needed. These include: Canes. Walkers. Scooters. Crutches. Turn on the lights when you go into a dark area. Replace any light bulbs as soon as they burn out. Set up your furniture so you have a clear path. Avoid moving your furniture around. If any of your floors are uneven, fix them. If there are any pets around you, be aware of where they are. Review your medicines with your doctor. Some medicines can make you feel dizzy. This can increase your chance of falling. Ask your doctor what other things that you can do to help prevent falls. This information is not intended to replace advice given to you by your health care provider. Make sure you discuss any questions you have with your health care provider. Document Released: 12/19/2008 Document Revised: 07/31/2015 Document Reviewed: 03/29/2014 Elsevier Interactive Patient Education  2017 [employer].

## 2021-04-14 ENCOUNTER — Ambulatory Visit
Admission: RE | Admit: 2021-04-14 | Discharge: 2021-04-14 | Disposition: A | Discharge status: Home / Self Care | Payer: Medicare Other | Source: Ambulatory Visit | Attending: Family Medicine | Admitting: Family Medicine

## 2021-04-14 DIAGNOSIS — Z1231 Encounter for screening mammogram for malignant neoplasm of breast: Secondary | ICD-10-CM

## 2021-04-28 ENCOUNTER — Other Ambulatory Visit: Payer: Self-pay | Admitting: Family Medicine

## 2021-04-28 NOTE — Telephone Encounter (Signed)
Pt had a recent AWV phone call with the nurse but no part 2 CPE with PCP. Please schedule pt's CPE with Dr. Glori Bickers   Med refilled once to get time to get appt scheduled

## 2021-06-12 NOTE — Telephone Encounter (Signed)
Spoke to pt. Made appt for 07-03-21. Same day labs. Pt was telling me she has been coughing stuff up the last few weeks. No other symptoms. I offered her an appt on Monday, April 10. Pt declined stating she already had another appt. I advised that she go to an UC if she get worse. ?

## 2021-07-03 ENCOUNTER — Ambulatory Visit (INDEPENDENT_AMBULATORY_CARE_PROVIDER_SITE_OTHER): Payer: Medicare Other | Admitting: Family Medicine

## 2021-07-03 VITALS — BP 122/64 | HR 83 | Temp 97.6°F | Resp 18 | Ht 59.5 in | Wt 144.8 lb

## 2021-07-03 DIAGNOSIS — K219 Gastro-esophageal reflux disease without esophagitis: Secondary | ICD-10-CM | POA: Diagnosis not present

## 2021-07-03 DIAGNOSIS — Z0000 Encounter for general adult medical examination without abnormal findings: Secondary | ICD-10-CM

## 2021-07-03 DIAGNOSIS — I700 Atherosclerosis of aorta: Secondary | ICD-10-CM | POA: Diagnosis not present

## 2021-07-03 DIAGNOSIS — Z79899 Other long term (current) drug therapy: Secondary | ICD-10-CM | POA: Insufficient documentation

## 2021-07-03 DIAGNOSIS — J301 Allergic rhinitis due to pollen: Secondary | ICD-10-CM

## 2021-07-03 DIAGNOSIS — E7800 Pure hypercholesterolemia, unspecified: Secondary | ICD-10-CM

## 2021-07-03 DIAGNOSIS — E2839 Other primary ovarian failure: Secondary | ICD-10-CM

## 2021-07-03 DIAGNOSIS — J309 Allergic rhinitis, unspecified: Secondary | ICD-10-CM | POA: Insufficient documentation

## 2021-07-03 DIAGNOSIS — M8589 Other specified disorders of bone density and structure, multiple sites: Secondary | ICD-10-CM

## 2021-07-03 LAB — CBC WITH DIFFERENTIAL/PLATELET
Basophils Absolute: 0 10*3/uL (ref 0.0–0.1)
Basophils Relative: 0.3 % (ref 0.0–3.0)
Eosinophils Absolute: 0.1 10*3/uL (ref 0.0–0.7)
Eosinophils Relative: 1.1 % (ref 0.0–5.0)
HCT: 39.1 % (ref 36.0–46.0)
Hemoglobin: 13 g/dL (ref 12.0–15.0)
Lymphocytes Relative: 34.5 % (ref 12.0–46.0)
Lymphs Abs: 2.6 10*3/uL (ref 0.7–4.0)
MCHC: 33.3 g/dL (ref 30.0–36.0)
MCV: 91.6 fl (ref 78.0–100.0)
Monocytes Absolute: 0.8 10*3/uL (ref 0.1–1.0)
Monocytes Relative: 10.6 % (ref 3.0–12.0)
Neutro Abs: 4.1 10*3/uL (ref 1.4–7.7)
Neutrophils Relative %: 53.5 % (ref 43.0–77.0)
Platelets: 211 10*3/uL (ref 150.0–400.0)
RBC: 4.27 Mil/uL (ref 3.87–5.11)
RDW: 13.5 % (ref 11.5–15.5)
WBC: 7.6 10*3/uL (ref 4.0–10.5)

## 2021-07-03 LAB — TSH: TSH: 1.8 u[IU]/mL (ref 0.35–5.50)

## 2021-07-03 LAB — COMPREHENSIVE METABOLIC PANEL
ALT: 10 U/L (ref 0–35)
AST: 15 U/L (ref 0–37)
Albumin: 4 g/dL (ref 3.5–5.2)
Alkaline Phosphatase: 69 U/L (ref 39–117)
BUN: 15 mg/dL (ref 6–23)
CO2: 36 mEq/L — ABNORMAL HIGH (ref 19–32)
Calcium: 9.6 mg/dL (ref 8.4–10.5)
Chloride: 100 mEq/L (ref 96–112)
Creatinine, Ser: 0.81 mg/dL (ref 0.40–1.20)
GFR: 66.41 mL/min (ref >60.00)
Glucose, Bld: 86 mg/dL (ref 70–99)
Potassium: 4.1 mEq/L (ref 3.5–5.1)
Sodium: 141 mEq/L (ref 135–145)
Total Bilirubin: 0.8 mg/dL (ref 0.2–1.2)
Total Protein: 6.6 g/dL (ref 6.0–8.3)

## 2021-07-03 LAB — LIPID PANEL
Cholesterol: 211 mg/dL — ABNORMAL HIGH (ref 0–200)
HDL: 61.6 mg/dL (ref >39.00)
LDL Cholesterol: 134 mg/dL — ABNORMAL HIGH (ref 0–99)
NonHDL: 149.34
Total CHOL/HDL Ratio: 3
Triglycerides: 79 mg/dL (ref 0.0–149.0)
VLDL: 15.8 mg/dL (ref 0.0–40.0)

## 2021-07-03 LAB — VITAMIN B12: Vitamin B-12: 287 pg/mL (ref 211–911)

## 2021-07-03 MED ORDER — OMEPRAZOLE 20 MG PO CPDR: 20.0000 mg | DELAYED_RELEASE_CAPSULE | Freq: Two times a day (BID) | ORAL | 3 refills | Status: DC

## 2021-07-03 NOTE — Assessment & Plan Note (Signed)
Continue to watch lipids and bp  ?No symptoms  ?

## 2021-07-03 NOTE — Progress Notes (Signed)
? ?Subjective:  ? ? Patient ID: Erika Evans, female    DOB: 1934-09-18, 87 y.o.   MRN: [id number] ? ?HPI ?Here for health maintenance exam and to review chronic medical problems   ? ?Wt Readings from Last 3 Encounters:  ?07/03/21 144 lb 12.8 oz (65.7 kg)  ?03/30/21 148 lb 9.6 oz (67.4 kg)  ?03/30/21 147 lb (66.7 kg)  ? ?28.76 kg/m? ? ?Had GI illness last week/vomited after eating chuck roast  ? ?Immunization History  ?Administered Date(s) Administered  ? Fluad Quad(high Dose 65+) 12/12/2018  ? Influenza Split 12/10/2010, 12/03/2011  ? Influenza, High Dose Seasonal PF 12/13/2016  ? Influenza,inj,Quad PF,6+ Mos 11/27/2012, 12/11/2013, 01/01/2015, 12/12/2015, 12/14/2017, 12/12/2019  ? Influenza-Unspecified 03/12/2020  ? PFIZER(Purple Top)SARS-COV-2 Vaccination 12/23/2020  ? Pneumococcal Conjugate-13 01/28/2015  ? Pneumococcal Polysaccharide-23 04/21/2011, 12/23/2020  ? Td 04/21/2011  ? Zoster, Live 12/13/2013  ? ?Shingrix: interested in if covered  ? ?Mammogram 04/2021 ?Self breast exam : no lumps  ? ?Dexa 02/2017 -osteopenia  ?Intolerant to oral bisphosphonate and evista  ?Falls- tripped vacuuming backwards    no injury ?Now careful now (has a cane if she needs it)  ?Fractures : none ?Supplements vit D 2000 iu daily  ?Ppi  ?Exercise :  not enough /some in yard/walking  ?Now doing some arm exercises indoors  ? ?Cologuard positive in 02/2019 ?Colonoscopy 2021-- small adenoma and no recall recommended  ? ?GERD ?Takes omerprazole 20 mg bid  ? ?Sees rheum for severe OA in hands with erosions  ? ?Hyperlipidemia ?Lab Results  ?Component Value Date  ? CHOL 214 (H) 12/16/2020  ? HDL 72 12/16/2020  ? LDLCALC 129 (H) 12/16/2020  ? LDLDIRECT 123.5 11/27/2012  ? TRIG 72 12/16/2020  ? CHOLHDL 3.0 12/16/2020  ?Declines medication  ? ?Patient Active Problem List  ? Diagnosis Date Noted  ? Current use of proton pump inhibitor 07/03/2021  ? Allergic rhinitis 07/03/2021  ? Erosive osteoarthritis of both hands 03/11/2021  ? Aortic  atherosclerosis ([place]) 11/05/2020  ? Hypokalemia 08/22/2019  ? Positive fecal immunochemical test 02/15/2019  ? Venous insufficiency of both lower extremities 12/21/2017  ? Routine general medical examination at a health care facility 01/28/2015  ? Screening for colon cancer 01/28/2015  ? Estrogen deficiency 01/28/2015  ? Encounter for Medicare annual wellness exam 11/26/2012  ? GERD (gastroesophageal reflux disease) 04/21/2011  ? OA (osteoarthritis) 04/21/2011  ? Hyperlipidemia 04/21/2011  ? Osteopenia 04/21/2011  ? ?Past Medical History:  ?Diagnosis Date  ? Asthma   ? Cataract 2019  ? resolved with surgery  ? Congenital fusion of cervical spine   ? Elevated liver enzymes   ? Foot pain   ? Left; nerve problem; procedure  ? GERD (gastroesophageal reflux disease)   ? Hepatitis B carrier ([place])   ? Positive hep B test at [facility]  ? Hyperlipidemia   ? Mild  ? Osteoarthritis   ? Osteopenia   ? Pneumonia 2004  ? ?Past Surgical History:  ?Procedure Laterality Date  ? "Blocked kidney"    ? BACK SURGERY    ? CARDIOVASCULAR STRESS TEST  9/07  ? Neg  ? CATARACT EXTRACTION W/ INTRAOCULAR LENS IMPLANT Right 05/06/2017  ? Dr. Tommy Rainwater  ? CATARACT EXTRACTION W/ INTRAOCULAR LENS IMPLANT Left 05/20/2017  ? Dr. Tommy Rainwater  ? COLONOSCOPY  9/06  ? Diverticulosis, hems  ? COLONOSCOPY  04/04/2019  ? Dexa    ? Osteopenis 9/01, improved 10/03, decreased BMD 5/06  ? PARTIAL HYSTERECTOMY    ?  UPPER GASTROINTESTINAL ENDOSCOPY  04/04/2019  ? US ECHOCARDIOGRAPHY  7/03  ? Abd-normal  ? US RENAL/AORTA  2007  ? Neg  ? ?Social History  ? ?Tobacco Use  ? Smoking status: Never  ? Smokeless tobacco: Never  ?Vaping Use  ? Vaping Use: Never used  ?Substance Use Topics  ? Alcohol use: No  ?  Alcohol/week: 0.0 standard drinks  ? Drug use: No  ? ?Family History  ?Problem Relation Age of Onset  ? Lung cancer Father   ? Cancer Sister   ?     Bladder  ? Dementia Sister   ? Stroke Brother   ? Leukemia Brother   ? Heart attack Brother   ? Colon cancer Neg Hx   ?  Colon polyps Neg Hx   ? Esophageal cancer Neg Hx   ? Rectal cancer Neg Hx   ? Stomach cancer Neg Hx   ? ?Allergies  ?Allergen Reactions  ? Alendronate Sodium   ?  REACTION: acid reflux  ? Miacalcin [Calcitonin ([place])] Other (See Comments)  ?  Pt thinks the spray gave her a staph infection in her nose   ? Pepcid [Famotidine] Diarrhea  ? Raloxifene   ?  REACTION: severe muscle pain and weakness  ? ?Current Outpatient Medications on File Prior to Visit  ?Medication Sig Dispense Refill  ? acetaminophen (TYLENOL) 325 MG tablet Take 2 tablets (650 mg total) by mouth every 4 (four) hours as needed for mild pain or moderate pain (or Fever >/= 101).    ? albuterol (PROVENTIL HFA;VENTOLIN HFA) 108 (90 BASE) MCG/ACT inhaler Inhale 2 puffs into the lungs every 4 (four) hours as needed for wheezing or shortness of breath. 1 Inhaler 3  ? aspirin 81 MG tablet Take 81 mg by mouth daily.      ? Bepotastine Besilate 1.5 % SOLN Place 1 drop into both eyes as needed.    ? Cholecalciferol (VITAMIN D-3) 1000 UNITS CAPS Take 2 capsules by mouth daily.    ? fish oil-omega-3 fatty acids 1000 MG capsule Take 1 g by mouth 2 (two) times daily.     ? Fluticasone-Salmeterol (ADVAIR) 250-50 MCG/DOSE AEPB Inhale 1 puff into the lungs every 12 (twelve) hours.     ? levocetirizine (XYZAL) 5 MG tablet Take 5 mg by mouth at bedtime.    ? montelukast (SINGULAIR) 10 MG tablet Take 10 mg by mouth daily.      ? ?No current facility-administered medications on file prior to visit.  ?  ?Review of Systems  ?Constitutional:  Negative for activity change, appetite change, fatigue, fever and unexpected weight change.  ?HENT:  Negative for congestion, ear pain, rhinorrhea, sinus pressure and sore throat.   ?Eyes:  Negative for pain, redness and visual disturbance.  ?Respiratory:  Negative for cough, shortness of breath and wheezing.   ?Cardiovascular:  Negative for chest pain and palpitations.  ?Gastrointestinal:  Negative for abdominal pain, blood in stool,  constipation and diarrhea.  ?Endocrine: Negative for polydipsia and polyuria.  ?Genitourinary:  Negative for dysuria, frequency and urgency.  ?Musculoskeletal:  Positive for arthralgias. Negative for back pain and myalgias.  ?Skin:  Negative for pallor and rash.  ?Allergic/Immunologic: Negative for environmental allergies.  ?Neurological:  Negative for dizziness, syncope and headaches.  ?Hematological:  Negative for adenopathy. Does not bruise/bleed easily.  ?Psychiatric/Behavioral:  Negative for decreased concentration and dysphoric mood. The patient is not nervous/anxious.   ? ?   ?Objective:  ? Physical Exam ?Constitutional:   ?  General: She is not in acute distress. ?   Appearance: Normal appearance. She is well-developed and normal weight. She is not ill-appearing or diaphoretic.  ?HENT:  ?   Head: Normocephalic and atraumatic.  ?   Right Ear: Tympanic membrane, ear canal and external ear normal.  ?   Left Ear: Tympanic membrane, ear canal and external ear normal.  ?   Nose: Nose normal. No congestion.  ?   Mouth/Throat:  ?   Mouth: Mucous membranes are moist.  ?   Pharynx: Oropharynx is clear. No posterior oropharyngeal erythema.  ?Eyes:  ?   General: No scleral icterus. ?   Extraocular Movements: Extraocular movements intact.  ?   Conjunctiva/sclera: Conjunctivae normal.  ?   Pupils: Pupils are equal, round, and reactive to light.  ?Neck:  ?   Thyroid: No thyromegaly.  ?   Vascular: No carotid bruit or JVD.  ?Cardiovascular:  ?   Rate and Rhythm: Normal rate and regular rhythm.  ?   Pulses: Normal pulses.  ?   Heart sounds: Normal heart sounds.  ?  No gallop.  ?Pulmonary:  ?   Effort: Pulmonary effort is normal. No respiratory distress.  ?   Breath sounds: Normal breath sounds. No wheezing.  ?   Comments: Good air exch ?Chest:  ?   Chest wall: No tenderness.  ?Abdominal:  ?   General: Bowel sounds are normal. There is no distension or abdominal bruit.  ?   Palpations: Abdomen is soft. There is no mass.  ?    Tenderness: There is no abdominal tenderness.  ?   Hernia: No hernia is present.  ?Genitourinary: ?   Comments: Breast exam: No mass, nodules, thickening, tenderness, bulging, retraction, inflamation, nipp

## 2021-07-03 NOTE — Assessment & Plan Note (Signed)
Small polyp colonoscopy 2021  ?No recall  ?

## 2021-07-03 NOTE — Assessment & Plan Note (Signed)
Reviewed health habits including diet and exercise and skin cancer prevention ?Reviewed appropriate screening tests for age  ?Also reviewed health mt list, fam hx and immunization status , as well as social and family history   ?See HPI ?Labs ordered ?amw reviewed ?dexa ordered  ?Plans to check on coverage of shingrix ?Mammogram utd  ?Colonoscopy utd ?No fractures, discussed fall prevention  ? ?

## 2021-07-03 NOTE — Patient Instructions (Addendum)
If you are interested in the new shingles vaccine (Shingrix) - call your local pharmacy to check on coverage and availability  ?If affordable, get on a wait list at your pharmacy to get the vaccine. ? ?Avoid foods that bother your stomach ? ?Labs today  ? ?Call to schedule your bone density test at [facility] ? ?Please call the location of your choice from the menu below to schedule your Mammogram and/or Bone Density appointment.   ? ?[place]  ? ?[facility]           ?      Phone: [phone] ?[address]                                                       ?[place], [place] [address]    ?Service: Bone Density ONLY  ? *this site does NOT perform mammograms ? ? ? ? ? ?

## 2021-07-03 NOTE — Assessment & Plan Note (Signed)
Disc goals for lipids and reasons to control them ?Rev last labs with pt ?Rev low sat fat diet in detail ?Declines medication in the past  ?Fair diet  ?Lab today ?

## 2021-07-03 NOTE — Assessment & Plan Note (Signed)
Omeprazole 20 mg bid  ?Controlled  ? ?

## 2021-07-03 NOTE — Assessment & Plan Note (Signed)
dexa ordered  ?One fall but no fracture (rev fall prevention) ?On D  ?Also takes ppi ?Enc her to continue exercise  ? ?

## 2021-07-03 NOTE — Assessment & Plan Note (Signed)
xyzal ?singulair  ?advair and albuterol for asthma  ? ?

## 2021-07-03 NOTE — Assessment & Plan Note (Signed)
Requires bid omeprazole  ?Taking vit D ?Vit B12 added to labs  ?

## 2021-07-06 ENCOUNTER — Telehealth: Payer: Self-pay | Admitting: Family Medicine

## 2021-07-06 ENCOUNTER — Encounter: Payer: Self-pay | Admitting: *Deleted

## 2021-07-06 NOTE — Telephone Encounter (Signed)
Form in your inbox 

## 2021-07-06 NOTE — Telephone Encounter (Signed)
Type of forms received:Power or Attorney ? ?Routed to:Shapael W ? ?Paperwork received by : Gwynn Burly ? ? ?Individual made aware of 3-5 business day turn around (Y/N): ? ?Form completed and patient made aware of charges(Y/N): ? ? ?Faxed to :  ? ?Form location: place in mail box ? ?

## 2021-07-07 NOTE — Telephone Encounter (Signed)
Verified with pt that this is out copy, form sent to scanning  ?

## 2021-07-07 NOTE — Telephone Encounter (Signed)
Please scan, I don't think there is anything for me to fill out, no charge.  In IN box  ?Unsure if she needs it back after scanning  ?

## 2021-08-14 ENCOUNTER — Telehealth: Payer: Self-pay | Admitting: Family Medicine

## 2021-08-14 NOTE — Telephone Encounter (Signed)
I left a message for the patient to return my call.

## 2021-08-14 NOTE — Telephone Encounter (Signed)
Hey can you look into this for patient said she  doesn't remember being contact regarding this . thanks

## 2021-08-14 NOTE — Telephone Encounter (Signed)
Pt called and wanted to request a bone density test, please advise when possible.   Callback Number: [phone]

## 2021-09-11 NOTE — Telephone Encounter (Signed)
I do not handle Bone Density scans, the CMAs do.  The patient will need to reach out to the location that is doing the Bone Density or wherever the patient requested to go to have this scheduled. The CMAs/providers have the information for where BDs are scheduled.

## 2022-01-07 ENCOUNTER — Ambulatory Visit (INDEPENDENT_AMBULATORY_CARE_PROVIDER_SITE_OTHER): Payer: Medicare Other

## 2022-01-07 DIAGNOSIS — Z23 Encounter for immunization: Secondary | ICD-10-CM

## 2022-03-31 ENCOUNTER — Ambulatory Visit (INDEPENDENT_AMBULATORY_CARE_PROVIDER_SITE_OTHER): Payer: Medicare Other

## 2022-03-31 VITALS — Ht 59.5 in | Wt 144.0 lb

## 2022-03-31 DIAGNOSIS — Z0000 Encounter for general adult medical examination without abnormal findings: Secondary | ICD-10-CM

## 2022-03-31 NOTE — Progress Notes (Signed)
Virtual Visit via Telephone Note  I connected with  Erika Evans on 03/31/22 at 11:30 AM EST by telephone and verified that I am speaking with the correct person using two identifiers.  Location: Patient: home Provider: [place] Persons participating in the virtual visit: [place]   I discussed the limitations, risks, security and privacy concerns of performing an evaluation and management service by telephone and the availability of in person appointments. The patient expressed understanding and agreed to proceed.  Interactive audio and video telecommunications were attempted between this nurse and patient, however failed, due to patient having technical difficulties OR patient did not have access to video capability.  We continued and completed visit with audio only.  Some vital signs may be absent or patient reported.   [name], LPN  Subjective:   Erika Evans is a 88 y.o. female who presents for Medicare Annual (Subsequent) preventive examination.  Review of Systems     Cardiac Risk Factors include: advanced age (>46mn, >>30women);dyslipidemia     Objective:    There were no vitals filed for this visit. There is no height or weight on file to calculate BMI.     03/31/2022   11:30 AM 03/30/2021    9:01 AM 10/25/2020    8:24 AM 10/24/2020    8:26 PM 08/20/2019    6:54 PM 12/20/2018    2:11 PM 12/14/2017    2:27 PM  Advanced Directives  Does Patient Have a Medical Advance Directive? Yes No Yes Yes No Yes No  Type of [place]  [place] will Healthcare Power of [place] will   Does patient want to make changes to medical advance directive? No - Patient declined  No - Patient declined No - Patient declined     Copy of [place] Chart? Yes - validated most recent copy scanned in chart (See row  information)  No - copy requested No - copy requested  No - copy requested   Would patient like information on creating a medical advance directive?  Yes (MAU/Ambulatory/Procedural Areas - Information given) No - Patient declined    No - Patient declined    Current Medications (verified) Outpatient Encounter Medications as of 03/31/2022  Medication Sig   acetaminophen (TYLENOL) 325 MG tablet Take 2 tablets (650 mg total) by mouth every 4 (four) hours as needed for mild pain or moderate pain (or Fever >/= 101).   albuterol (PROVENTIL HFA;VENTOLIN HFA) 108 (90 BASE) MCG/ACT inhaler Inhale 2 puffs into the lungs every 4 (four) hours as needed for wheezing or shortness of breath.   aspirin 81 MG tablet Take 81 mg by mouth daily.     Bepotastine Besilate 1.5 % SOLN Place 1 drop into both eyes as needed.   Cholecalciferol (VITAMIN D-3) 1000 UNITS CAPS Take 2 capsules by mouth daily.   fish oil-omega-3 fatty acids 1000 MG capsule Take 1 g by mouth 2 (two) times daily.    Fluticasone-Salmeterol (ADVAIR) 250-50 MCG/DOSE AEPB Inhale 1 puff into the lungs every 12 (twelve) hours.    levocetirizine (XYZAL) 5 MG tablet Take 5 mg by mouth at bedtime.   montelukast (SINGULAIR) 10 MG tablet Take 10 mg by mouth daily.     omeprazole (PRILOSEC) 20 MG capsule Take 1 capsule (20 mg total) by mouth 2 (two) times daily before a meal.   No facility-administered encounter medications  on file as of 03/31/2022.    Allergies (verified) Alendronate sodium, Iodinated contrast media, Miacalcin [calcitonin (salmon)], Pepcid [famotidine], and Raloxifene   History: Past Medical History:  Diagnosis Date   Asthma    Cataract 2019   resolved with surgery   Congenital fusion of cervical spine    Elevated liver enzymes    Foot pain    Left; nerve problem; procedure   GERD (gastroesophageal reflux disease)    Hepatitis B carrier (HCC)    Positive hep B test at [facility]   Hyperlipidemia    Mild   Osteoarthritis     Osteopenia    Pneumonia 2004   Past Surgical History:  Procedure Laterality Date   "Blocked kidney"     BACK SURGERY     CARDIOVASCULAR STRESS TEST  9/07   Neg   CATARACT EXTRACTION W/ INTRAOCULAR LENS IMPLANT Right 05/06/2017   Dr. Tommy Rainwater   CATARACT EXTRACTION W/ INTRAOCULAR LENS IMPLANT Left 05/20/2017   Dr. Tommy Rainwater   COLONOSCOPY  9/06   Diverticulosis, hems   COLONOSCOPY  04/04/2019   Dexa     Osteopenis 9/01, improved 10/03, decreased BMD 5/06   PARTIAL HYSTERECTOMY     UPPER GASTROINTESTINAL ENDOSCOPY  04/04/2019   US ECHOCARDIOGRAPHY  7/03   Abd-normal   US RENAL/AORTA  2007   Neg   Family History  Problem Relation Age of Onset   Lung cancer Father    Cancer Sister        Bladder   Dementia Sister    Stroke Brother    Leukemia Brother    Heart attack Brother    Colon cancer Neg Hx    Colon polyps Neg Hx    Esophageal cancer Neg Hx    Rectal cancer Neg Hx    Stomach cancer Neg Hx    Social History   Socioeconomic History   Marital status: Widowed    Spouse name: Not on file   Number of children: 0   Years of education: Not on file   Highest education level: Not on file  Occupational History    Employer: [employer]    Comment: Retired  Tobacco Use   Smoking status: Never   Smokeless tobacco: Never  Vaping Use   Vaping Use: Never used  Substance and Sexual Activity   Alcohol use: No    Alcohol/week: 0.0 standard drinks of alcohol   Drug use: No   Sexual activity: Not Currently  Other Topics Concern   Not on file  Social History Narrative   Lost husband November 2004.   Lives alone   Is a twin (atually triplet but third (boy) died at birth)   Sometimes walks for exercise.   Retired [employer]   No children   Never smoker, No ETOH, rare caffeine   Social Determinants of Health   Financial Resource Strain: Low Risk  (03/31/2022)   Overall Financial Resource Strain (CARDIA)    Difficulty of Paying Living Expenses: Not hard at all  Food  Insecurity: No Food Insecurity (03/31/2022)   Hunger Vital Sign    Worried About Running Out of Food in the Last Year: Never true    [place] in the Last Year: Never true  Transportation Needs: No Transportation Needs (03/31/2022)   PRAPARE - Hydrologist (Medical): No    Lack of Transportation (Non-Medical): No  Physical Activity: Inactive (03/31/2022)   Exercise Vital Sign    Days of  Exercise per Week: 0 days    Minutes of Exercise per Session: 0 min  Stress: No Stress Concern Present (03/31/2022)   [place]    Feeling of Stress : Not at all  Social Connections: Moderately Isolated (03/31/2022)   Social Connection and Isolation Panel [NHANES]    Frequency of Communication with Friends and Family: More than three times a week    Frequency of Social Gatherings with Friends and Family: Once a week    Attends Religious Services: More than 4 times per year    Active Member of [employer] or Organizations: No    Attends Archivist Meetings: Never    Marital Status: Widowed    Tobacco Counseling Counseling given: Not Answered   Clinical Intake:  Pre-visit preparation completed: Yes  Pain : No/denies pain     Nutritional Risks: None Diabetes: No  How often do you need to have someone help you when you read instructions, pamphlets, or other written materials from your doctor or pharmacy?: 1 - Never  Diabetic?no  Interpreter Needed?: No  Information entered by :: Kirke Shaggy, LPN   Activities of Daily Living    03/31/2022   11:31 AM  In your present state of health, do you have any difficulty performing the following activities:  Hearing? 0  Vision? 0  Difficulty concentrating or making decisions? 0  Walking or climbing stairs? 0  Dressing or bathing? 0  Doing errands, shopping? 0  Preparing Food and eating ? N  Using the Toilet? N  In the past six months, have  you accidently leaked urine? Y  Do you have problems with loss of bowel control? N  Managing your Medications? N  Managing your Finances? N  Housekeeping or managing your Housekeeping? N    Patient Care Team: Tower, Wynelle Fanny, MD as PCP - General  Indicate any recent Medical Services you may have received from other than [facility] providers in the past year (date may be approximate).     Assessment:   This is a routine wellness examination for [facility].  Hearing/Vision screen Hearing Screening - Comments:: No aids Vision Screening - Comments:: Readers- Dr.Bulakowski  Dietary issues and exercise activities discussed: Current Exercise Habits: The patient does not participate in regular exercise at present   Goals Addressed             This Visit's Progress    DIET - EAT MORE FRUITS AND VEGETABLES         Depression Screen    03/31/2022   11:28 AM 03/30/2021    9:05 AM 12/20/2018    2:13 PM 12/14/2017   10:57 AM 12/13/2016   11:29 AM 01/28/2015   11:08 AM 12/04/2012   10:29 AM  PHQ 2/9 Scores  PHQ - 2 Score 1 1 0 2 0 0 0  PHQ- 9 Score 2  0 2 0      Fall Risk    03/31/2022   11:31 AM 03/30/2021    9:03 AM 12/20/2018    2:13 PM 12/14/2017   10:57 AM 12/13/2016   11:29 AM  Fall Risk   Falls in the past year? 0 0 0 No No  Number falls in past yr: 0 0     Injury with Fall? 0 0     Risk for fall due to : No Fall Risks  Medication side effect;Impaired balance/gait    Follow up Falls prevention discussed;Falls evaluation completed Falls prevention  discussed Falls evaluation completed;Falls prevention discussed      FALL RISK PREVENTION PERTAINING TO THE HOME:  Any stairs in or around the home? Yes  If so, are there any without handrails? No  Home free of loose throw rugs in walkways, pet beds, electrical cords, etc? Yes  Adequate lighting in your home to reduce risk of falls? Yes   ASSISTIVE DEVICES UTILIZED TO PREVENT FALLS:  Life alert? No  Use of a cane, walker or  w/c? No  Grab bars in the bathroom? No  Shower chair or bench in shower? Yes  Elevated toilet seat or a handicapped toilet? No   Cognitive Function:    12/20/2018    2:23 PM 12/14/2017   11:08 AM 12/13/2016   11:29 AM  MMSE - Mini Mental State Exam  Orientation to time '5 5 5  '$ Orientation to Place '5 5 5  '$ Registration '3 3 3  '$ Attention/ Calculation 5 0 0  Recall '3 3 3  '$ Language- name 2 objects  0 0  Language- repeat '1 1 1  '$ Language- follow 3 step command  3 3  Language- read & follow direction  0 0  Write a sentence  0 0  Copy design  0 0  Total score  20 20        03/31/2022   11:36 AM  6CIT Screen  What Year? 0 points  What month? 0 points  What time? 0 points  Count back from 20 4 points  Months in reverse 0 points  Repeat phrase 0 points  Total Score 4 points    Immunizations Immunization History  Administered Date(s) Administered   Fluad Quad(high Dose 65+) 12/12/2018, 01/07/2022   Influenza Split 12/10/2010, 12/03/2011   Influenza, High Dose Seasonal PF 12/13/2016   Influenza,inj,Quad PF,6+ Mos 11/27/2012, 12/11/2013, 01/01/2015, 12/12/2015, 12/14/2017, 12/12/2019   Influenza-Unspecified 03/12/2020   PFIZER(Purple Top)SARS-COV-2 Vaccination 12/23/2020   Pneumococcal Conjugate-13 01/28/2015   Pneumococcal Polysaccharide-23 04/21/2011, 12/23/2020   Td 04/21/2011   Zoster, Live 12/13/2013    TDAP status: Due, Education has been provided regarding the importance of this vaccine. Advised may receive this vaccine at local pharmacy or Health Dept. Aware to provide a copy of the vaccination record if obtained from local pharmacy or Health Dept. Verbalized acceptance and understanding.  Flu Vaccine status: Up to date  Pneumococcal vaccine status: Up to date  Covid-19 vaccine status: Completed vaccines  Qualifies for Shingles Vaccine? Yes   Zostavax completed Yes   Shingrix Completed?: No.    Education has been provided regarding the importance of this vaccine.  Patient has been advised to call insurance company to determine out of pocket expense if they have not yet received this vaccine. Advised may also receive vaccine at local pharmacy or Health Dept. Verbalized acceptance and understanding.  Screening Tests Health Maintenance  Topic Date Due   Zoster Vaccines- Shingrix (1 of 2) Never done   COVID-19 Vaccine (2 - Pfizer risk series) 01/13/2021   DTaP/Tdap/Td (2 - Tdap) 04/20/2021   MAMMOGRAM  04/14/2022   Medicare Annual Wellness (AWV)  04/01/2023   Pneumonia Vaccine 53+ Years old  Completed   INFLUENZA VACCINE  Completed   DEXA SCAN  Completed   HPV VACCINES  Aged Out    Health Maintenance  Health Maintenance Due  Topic Date Due   Zoster Vaccines- Shingrix (1 of 2) Never done   COVID-19 Vaccine (2 - Pfizer risk series) 01/13/2021   DTaP/Tdap/Td (2 - Tdap) 04/20/2021  Colorectal cancer screening: No longer required.   Mammogram status: No longer required due to age.  Bone Density status: Ordered 07/03/21. Pt provided with contact info and advised to call to schedule appt.  Lung Cancer Screening: (Low Dose CT Chest recommended if Age 52-80 years, 30 pack-year currently smoking OR have quit w/in 15years.) does not qualify.    Additional Screening:  Hepatitis C Screening: does not qualify; Completed no  Vision Screening: Recommended annual ophthalmology exams for early detection of glaucoma and other disorders of the eye. Is the patient up to date with their annual eye exam?  Yes  Who is the provider or what is the name of the office in which the patient attends annual eye exams? Dr.Bulakowski If pt is not established with a provider, would they like to be referred to a provider to establish care? No .   Dental Screening: Recommended annual dental exams for proper oral hygiene  Community Resource Referral / Chronic Care Management: CRR required this visit?  No   CCM required this visit?  No      Plan:     I have  personally reviewed and noted the following in the patient's chart:   Medical and social history Use of alcohol, tobacco or illicit drugs  Current medications and supplements including opioid prescriptions. Patient is not currently taking opioid prescriptions. Functional ability and status Nutritional status Physical activity Advanced directives List of other physicians Hospitalizations, surgeries, and ER visits in previous 12 months Vitals Screenings to include cognitive, depression, and falls Referrals and appointments  In addition, I have reviewed and discussed with patient certain preventive protocols, quality metrics, and best practice recommendations. A written personalized care plan for preventive services as well as general preventive health recommendations were provided to patient.     [name], LPN   [id number]   Nurse Notes: none

## 2022-03-31 NOTE — Patient Instructions (Signed)
Erika Evans , Thank you for taking time to come for your Medicare Wellness Visit. I appreciate your ongoing commitment to your health goals. Please review the following plan we discussed and let me know if I can assist you in the future.   These are the goals we discussed:  Goals      DIET - EAT MORE FRUITS AND VEGETABLES     Increase physical activity     As weather permits, I will attempt to walk at least 30 minutes 2-3 days per week.      Patient Stated     12/20/2018, I will start trying to exercise more daily.     Patient Stated     Would like to lose some weight        This is a list of the screening recommended for you and due dates:  Health Maintenance  Topic Date Due   Zoster (Shingles) Vaccine (1 of 2) Never done   COVID-19 Vaccine (2 - Pfizer risk series) 01/13/2021   DTaP/Tdap/Td vaccine (2 - Tdap) 04/20/2021   Mammogram  04/14/2022   Medicare Annual Wellness Visit  04/01/2023   Pneumonia Vaccine  Completed   Flu Shot  Completed   DEXA scan (bone density measurement)  Completed   HPV Vaccine  Aged Out    Advanced directives: no  Conditions/risks identified: none  Next appointment: Follow up in one year for your annual wellness visit 04/04/23 @ 9:45 am by phone   Preventive Care 65 Years and Older, Female Preventive care refers to lifestyle choices and visits with your health care provider that can promote health and wellness. What does preventive care include? A yearly physical exam. This is also called an annual well check. Dental exams once or twice a year. Routine eye exams. Ask your health care provider how often you should have your eyes checked. Personal lifestyle choices, including: Daily care of your teeth and gums. Regular physical activity. Eating a healthy diet. Avoiding tobacco and drug use. Limiting alcohol use. Practicing safe sex. Taking low-dose aspirin every day. Taking vitamin and mineral supplements as recommended by your health care  provider. What happens during an annual well check? The services and screenings done by your health care provider during your annual well check will depend on your age, overall health, lifestyle risk factors, and family history of disease. Counseling  Your health care provider may ask you questions about your: Alcohol use. Tobacco use. Drug use. Emotional well-being. Home and relationship well-being. Sexual activity. Eating habits. History of falls. Memory and ability to understand (cognition). Work and work Statistician. Reproductive health. Screening  You may have the following tests or measurements: Height, weight, and BMI. Blood pressure. Lipid and cholesterol levels. These may be checked every 5 years, or more frequently if you are over 23 years old. Skin check. Lung cancer screening. You may have this screening every year starting at age 10 if you have a 30-pack-year history of smoking and currently smoke or have quit within the past 15 years. Fecal occult blood test (FOBT) of the stool. You may have this test every year starting at age 71. Flexible sigmoidoscopy or colonoscopy. You may have a sigmoidoscopy every 5 years or a colonoscopy every 10 years starting at age 57. Hepatitis C blood test. Hepatitis B blood test. Sexually transmitted disease (STD) testing. Diabetes screening. This is done by checking your blood sugar (glucose) after you have not eaten for a while (fasting). You may have this done every  1-3 years. Bone density scan. This is done to screen for osteoporosis. You may have this done starting at age 46. Mammogram. This may be done every 1-2 years. Talk to your health care provider about how often you should have regular mammograms. Talk with your health care provider about your test results, treatment options, and if necessary, the need for more tests. Vaccines  Your health care provider may recommend certain vaccines, such as: Influenza vaccine. This is  recommended every year. Tetanus, diphtheria, and acellular pertussis (Tdap, Td) vaccine. You may need a Td booster every 10 years. Zoster vaccine. You may need this after age 61. Pneumococcal 13-valent conjugate (PCV13) vaccine. One dose is recommended after age 50. Pneumococcal polysaccharide (PPSV23) vaccine. One dose is recommended after age 47. Talk to your health care provider about which screenings and vaccines you need and how often you need them. This information is not intended to replace advice given to you by your health care provider. Make sure you discuss any questions you have with your health care provider. Document Released: 03/21/2015 Document Revised: 11/12/2015 Document Reviewed: 12/24/2014 Elsevier Interactive Patient Education  2017 [place] Prevention in the Home Falls can cause injuries. They can happen to people of all ages. There are many things you can do to make your home safe and to help prevent falls. What can I do on the outside of my home? Regularly fix the edges of walkways and driveways and fix any cracks. Remove anything that might make you trip as you walk through a door, such as a raised step or threshold. Trim any bushes or trees on the path to your home. Use bright outdoor lighting. Clear any walking paths of anything that might make someone trip, such as rocks or tools. Regularly check to see if handrails are loose or broken. Make sure that both sides of any steps have handrails. Any raised decks and porches should have guardrails on the edges. Have any leaves, snow, or ice cleared regularly. Use sand or salt on walking paths during winter. Clean up any spills in your garage right away. This includes oil or grease spills. What can I do in the bathroom? Use night lights. Install grab bars by the toilet and in the tub and shower. Do not use towel bars as grab bars. Use non-skid mats or decals in the tub or shower. If you need to sit down in  the shower, use a plastic, non-slip stool. Keep the floor dry. Clean up any water that spills on the floor as soon as it happens. Remove soap buildup in the tub or shower regularly. Attach bath mats securely with double-sided non-slip rug tape. Do not have throw rugs and other things on the floor that can make you trip. What can I do in the bedroom? Use night lights. Make sure that you have a light by your bed that is easy to reach. Do not use any sheets or blankets that are too big for your bed. They should not hang down onto the floor. Have a firm chair that has side arms. You can use this for support while you get dressed. Do not have throw rugs and other things on the floor that can make you trip. What can I do in the kitchen? Clean up any spills right away. Avoid walking on wet floors. Keep items that you use a lot in easy-to-reach places. If you need to reach something above you, use a strong step stool that has a  grab bar. Keep electrical cords out of the way. Do not use floor polish or wax that makes floors slippery. If you must use wax, use non-skid floor wax. Do not have throw rugs and other things on the floor that can make you trip. What can I do with my stairs? Do not leave any items on the stairs. Make sure that there are handrails on both sides of the stairs and use them. Fix handrails that are broken or loose. Make sure that handrails are as long as the stairways. Check any carpeting to make sure that it is firmly attached to the stairs. Fix any carpet that is loose or worn. Avoid having throw rugs at the top or bottom of the stairs. If you do have throw rugs, attach them to the floor with carpet tape. Make sure that you have a light switch at the top of the stairs and the bottom of the stairs. If you do not have them, ask someone to add them for you. What else can I do to help prevent falls? Wear shoes that: Do not have high heels. Have rubber bottoms. Are comfortable  and fit you well. Are closed at the toe. Do not wear sandals. If you use a stepladder: Make sure that it is fully opened. Do not climb a closed stepladder. Make sure that both sides of the stepladder are locked into place. Ask someone to hold it for you, if possible. Clearly mark and make sure that you can see: Any grab bars or handrails. First and last steps. Where the edge of each step is. Use tools that help you move around (mobility aids) if they are needed. These include: Canes. Walkers. Scooters. Crutches. Turn on the lights when you go into a dark area. Replace any light bulbs as soon as they burn out. Set up your furniture so you have a clear path. Avoid moving your furniture around. If any of your floors are uneven, fix them. If there are any pets around you, be aware of where they are. Review your medicines with your doctor. Some medicines can make you feel dizzy. This can increase your chance of falling. Ask your doctor what other things that you can do to help prevent falls. This information is not intended to replace advice given to you by your health care provider. Make sure you discuss any questions you have with your health care provider. Document Released: 12/19/2008 Document Revised: 07/31/2015 Document Reviewed: 03/29/2014 Elsevier Interactive Patient Education  2017 [employer].

## 2022-04-05 ENCOUNTER — Encounter: Payer: Self-pay | Admitting: Family Medicine

## 2022-04-05 ENCOUNTER — Ambulatory Visit: Payer: Medicare Other | Admitting: Family Medicine

## 2022-04-05 VITALS — BP 132/82 | HR 108 | Temp 97.9°F | Ht 59.5 in | Wt 144.0 lb

## 2022-04-05 DIAGNOSIS — J189 Pneumonia, unspecified organism: Secondary | ICD-10-CM | POA: Diagnosis not present

## 2022-04-05 MED ORDER — PREDNISONE 20 MG PO TABS: ORAL_TABLET | ORAL | 0 refills | Status: DC

## 2022-04-05 MED ORDER — DOXYCYCLINE HYCLATE 100 MG PO TABS: 100.0000 mg | ORAL_TABLET | Freq: Two times a day (BID) | ORAL | 0 refills | Status: DC

## 2022-04-05 NOTE — Progress Notes (Signed)
Erika Banos T. Jackolyn Geron, MD, [place] at [facility] [place], [address]  Phone: [phone]  FAX: [place] - 88 y.o. female  MRN [id number]  Date of Birth: [birth year]  Date: 04/05/2022  PCP: Abner Greenspan, MD  Referral: Abner Greenspan, MD  Chief Complaint  Patient presents with   Cough    X 2 weeks Negative Covid Test on Saturday   Diarrhea   Headache   Subjective:   Erika Evans is a 88 y.o. very pleasant female patient with Body mass index is 28.6 kg/m. who presents with the following:  2 weeks of acute illness:   Does have a history of asthma, has had it off and on for many years. She does have an albuterol inhaler.  Non-smoker.   Feels short of breath Rattling in her chest.   Has had a headache.  No st.  Productive cough, yellowish phlegm.  Some mucous in the back of the throat.   This morning had some loose stools.  Over months, she has had a sting in her stomach.   Eating ok - not had much effect on her appetite.   Overall, does not feel well at all.   Diffuse rhonchi, some crackles and wheezing  Review of Systems is noted in the HPI, as appropriate  Objective:   BP 132/82   Pulse (!) 108   Temp 97.9 F (36.6 C) (Temporal)   Ht 4' 11.5" (1.511 m)   Wt 144 lb (65.3 kg)   SpO2 94%   BMI 28.60 kg/m    Gen: WDWN, NAD. Globally Non-toxic HEENT: Throat clear, w/o exudate, R TM clear, L TM - good landmarks, No fluid present. rhinnorhea.  MMM Frontal sinuses: NT Max sinuses: NT NECK: Anterior cervical  LAD is absent CV: RRR, No M/G/R, cap refill <2 sec PULM: Diffuse rhonchorous sounds throughout all lung fields.  She also has some scattered crackles and scattered wheezing throughout her lung fields, as well.  Laboratory and Imaging Data:  Assessment and Plan:     ICD-10-CM   1. Community acquired pneumonia, unspecified laterality  J18.9      History and  exam consistent with community-acquired pneumonia, multi lobar.  She really has a profound lung exam, and I think that she needs to be covered right now with antibiotics and steroids.  With a reported history of asthma, there may be some degree of exacerbation going on right now, as well.  Medication Management during today's office visit: Meds ordered this encounter  Medications   doxycycline (VIBRA-TABS) 100 MG tablet    Sig: Take 1 tablet (100 mg total) by mouth 2 (two) times daily.    Dispense:  20 tablet    Refill:  0   predniSONE (DELTASONE) 20 MG tablet    Sig: 2 tabs po daily for 5 days, then 1 tab po daily for 5 days    Dispense:  15 tablet    Refill:  0   There are no discontinued medications.  Orders placed today for conditions managed today: No orders of the defined types were placed in this encounter.   Disposition: No follow-ups on file.  Dragon [employer] One speech-to-text software was used for transcription in this dictation.  Possible transcriptional errors can occur using Editor, commissioning.   Signed,  Maud Deed. Zala Degrasse, MD   Outpatient Encounter Medications as of 04/05/2022  Medication Sig   acetaminophen (TYLENOL) 325  MG tablet Take 2 tablets (650 mg total) by mouth every 4 (four) hours as needed for mild pain or moderate pain (or Fever >/= 101).   albuterol (PROVENTIL HFA;VENTOLIN HFA) 108 (90 BASE) MCG/ACT inhaler Inhale 2 puffs into the lungs every 4 (four) hours as needed for wheezing or shortness of breath.   aspirin 81 MG tablet Take 81 mg by mouth daily.     Bepotastine Besilate 1.5 % SOLN Place 1 drop into both eyes as needed.   Cholecalciferol (VITAMIN D-3) 1000 UNITS CAPS Take 2 capsules by mouth daily.   doxycycline (VIBRA-TABS) 100 MG tablet Take 1 tablet (100 mg total) by mouth 2 (two) times daily.   fish oil-omega-3 fatty acids 1000 MG capsule Take 1 g by mouth 2 (two) times daily.    Fluticasone-Salmeterol (ADVAIR) 250-50 MCG/DOSE AEPB Inhale 1  puff into the lungs every 12 (twelve) hours.    levocetirizine (XYZAL) 5 MG tablet Take 5 mg by mouth at bedtime.   montelukast (SINGULAIR) 10 MG tablet Take 10 mg by mouth daily.     omeprazole (PRILOSEC) 20 MG capsule Take 1 capsule (20 mg total) by mouth 2 (two) times daily before a meal.   predniSONE (DELTASONE) 20 MG tablet 2 tabs po daily for 5 days, then 1 tab po daily for 5 days   No facility-administered encounter medications on file as of 04/05/2022.

## 2022-04-17 IMAGING — MG MM DIGITAL SCREENING BILAT W/ TOMO AND CAD
7 series · 9 of 19 positions shown · non-contrast
Comparison: Previous exam(s).

CLINICAL DATA: Screening.

EXAM:
DIGITAL SCREENING BILATERAL MAMMOGRAM WITH TOMOSYNTHESIS AND CAD
TECHNIQUE: Bilateral screening digital craniocaudal and mediolateral oblique
mammograms were obtained. Bilateral screening digital breast
tomosynthesis was performed. The images were evaluated with
computer-aided detection.

[L MLO synth-2D]
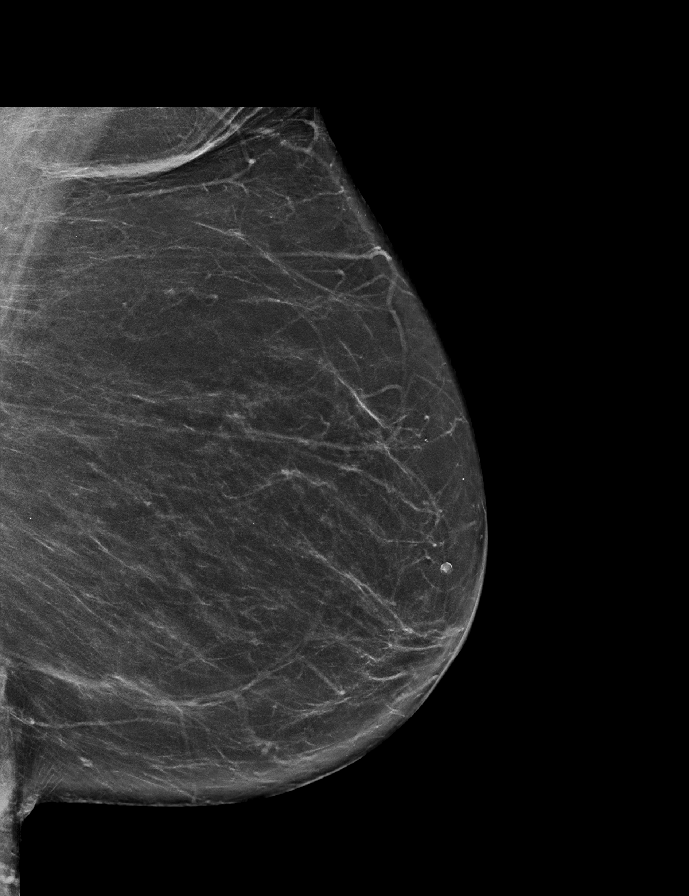

[R CC synth-2D]
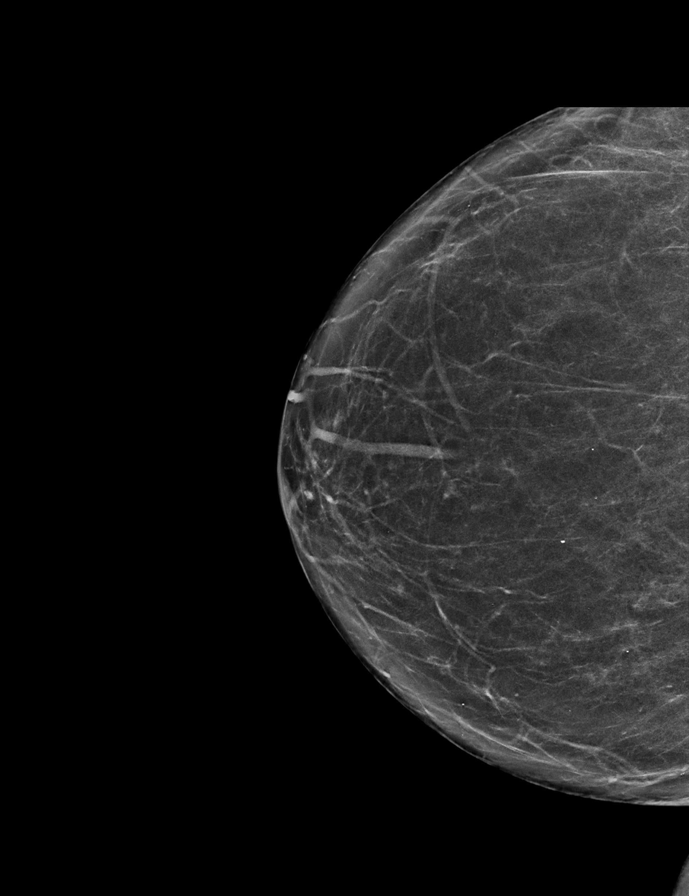

[L CC synth-2D]
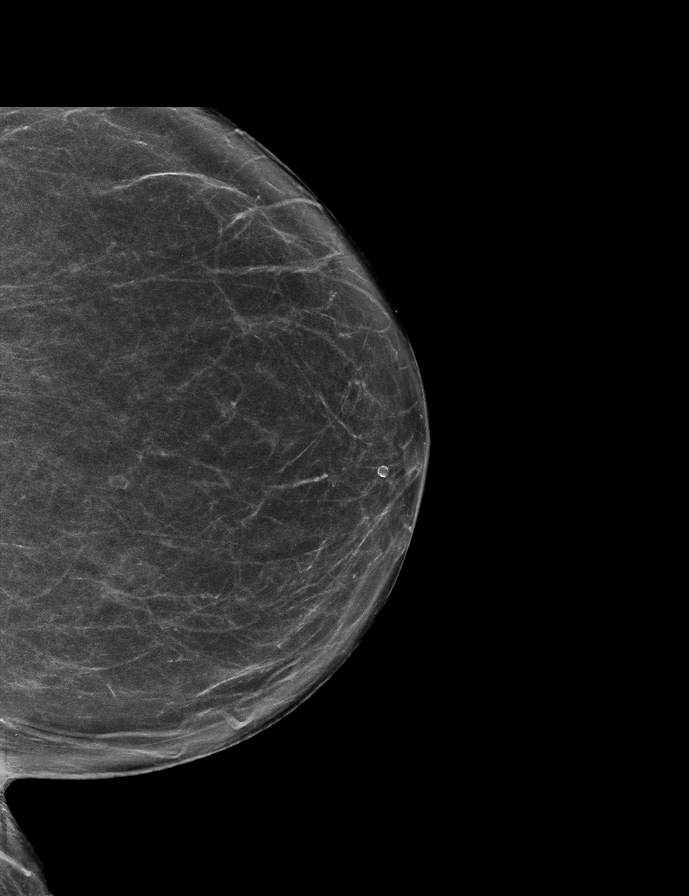

[R MLO synth-2D]
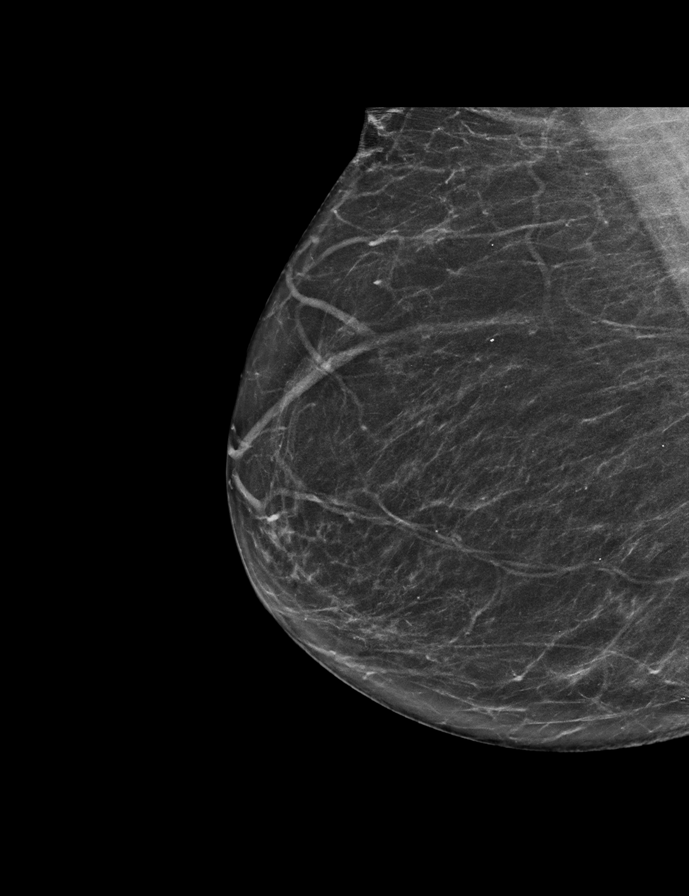

[L CC tomo · 3 of 66 frames shown]
[frame 22/66]
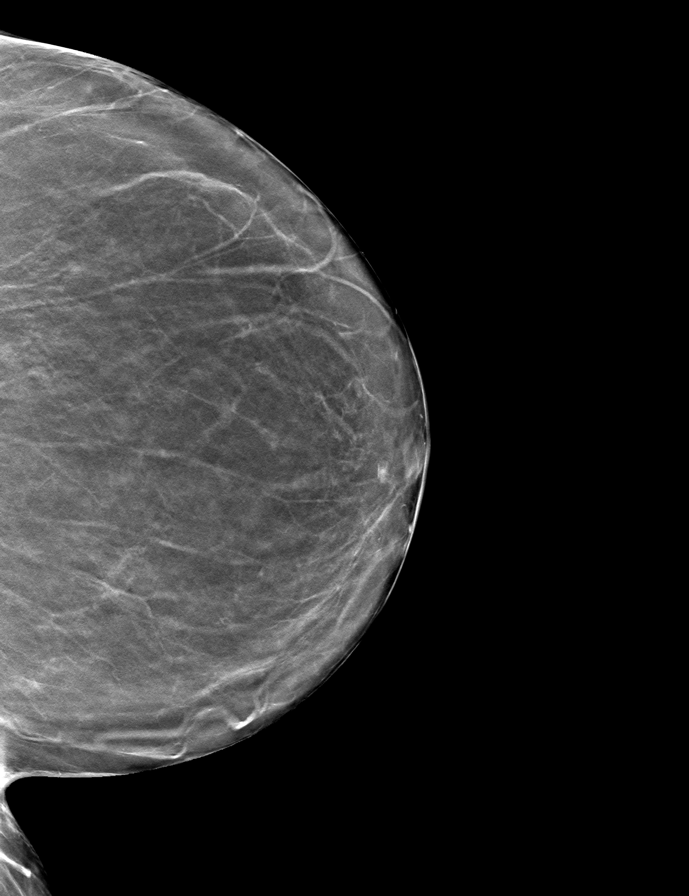
[frame 33/66]
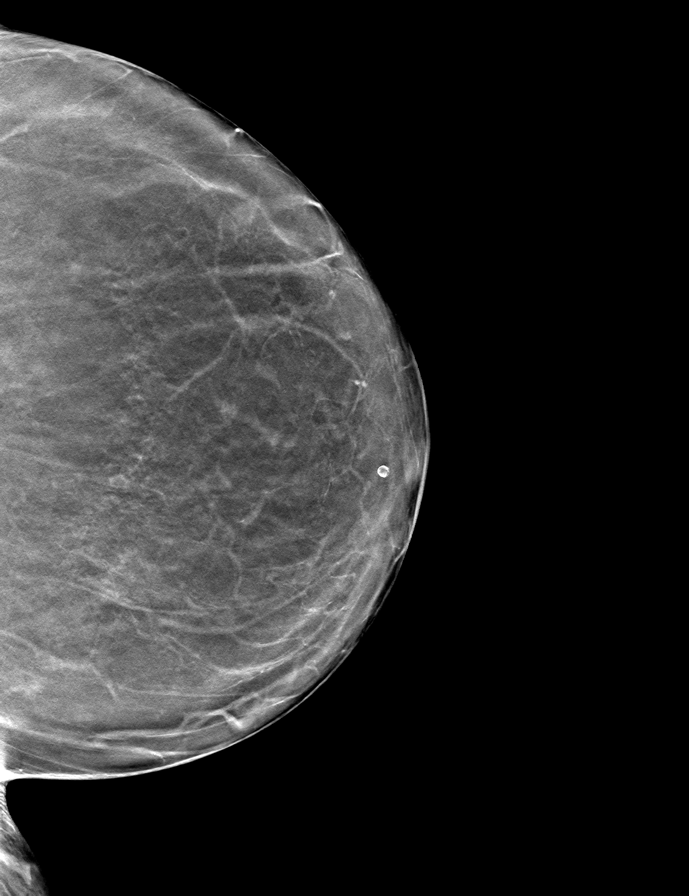
[frame 45/66]
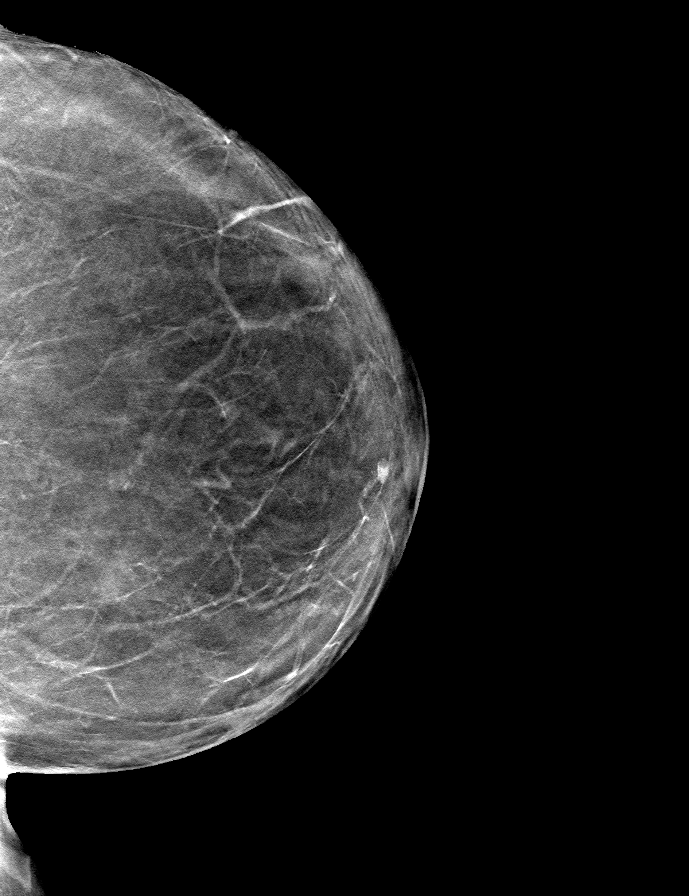

[R MLO tomo · tomo slice 33/64.0]
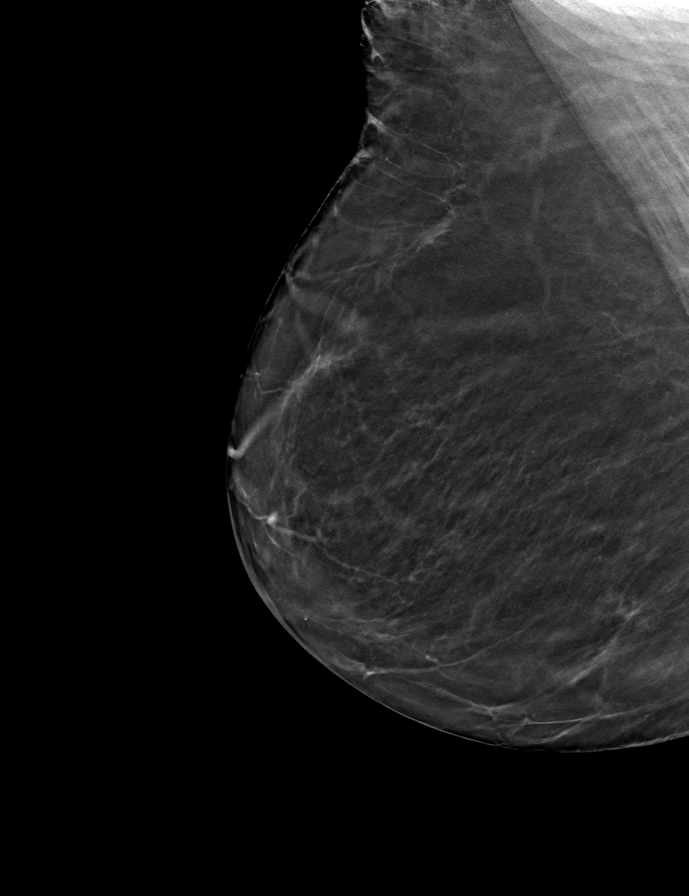

[R CC tomo · tomo slice 31/60.0]
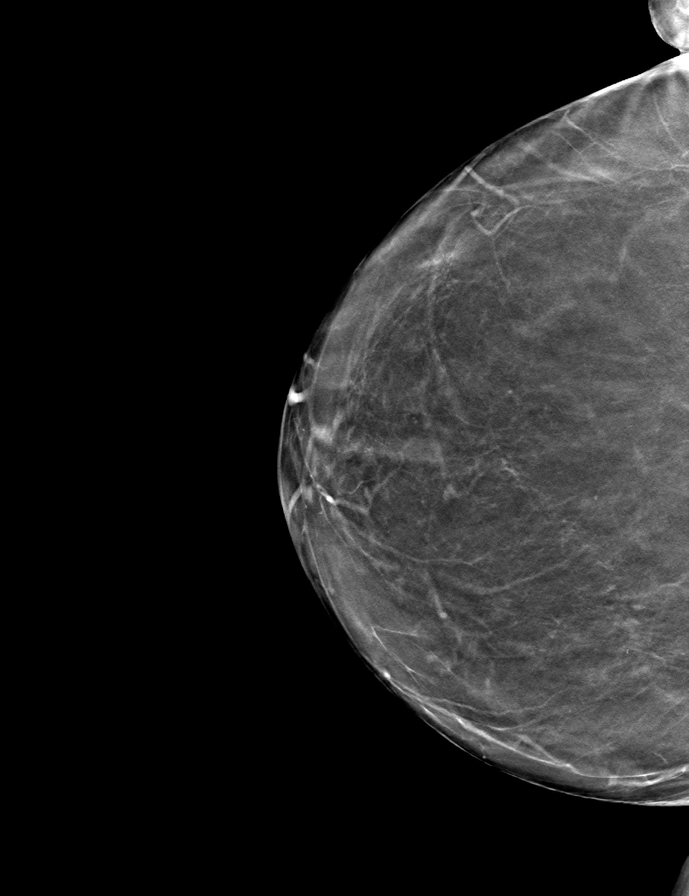

[9 of 19 positions shown; findings below may reference images not displayed]

ACR Breast Density Category b: There are scattered areas of
fibroglandular density.
FINDINGS: There are no findings suspicious for malignancy.
IMPRESSION: No mammographic evidence of malignancy. A result letter of this
screening mammogram will be mailed directly to the patient.

RECOMMENDATION:
Screening mammogram in one year. (Code:51-O-LD2)

BI-RADS CATEGORY  1: Negative.

## 2022-05-12 ENCOUNTER — Ambulatory Visit: Payer: Medicare Other | Admitting: Family Medicine

## 2022-05-12 ENCOUNTER — Encounter: Payer: Self-pay | Admitting: Family Medicine

## 2022-05-12 ENCOUNTER — Ambulatory Visit (INDEPENDENT_AMBULATORY_CARE_PROVIDER_SITE_OTHER)
Admission: RE | Admit: 2022-05-12 | Discharge: 2022-05-12 | Disposition: A | Discharge status: Home / Self Care | Payer: Medicare Other | Source: Ambulatory Visit | Attending: Family Medicine | Admitting: Family Medicine

## 2022-05-12 VITALS — BP 112/64 | HR 114 | Temp 98.1°F | Ht 59.5 in | Wt 141.0 lb

## 2022-05-12 DIAGNOSIS — M79672 Pain in left foot: Secondary | ICD-10-CM | POA: Insufficient documentation

## 2022-05-12 DIAGNOSIS — J069 Acute upper respiratory infection, unspecified: Secondary | ICD-10-CM

## 2022-05-12 DIAGNOSIS — R0602 Shortness of breath: Secondary | ICD-10-CM | POA: Insufficient documentation

## 2022-05-12 DIAGNOSIS — R0600 Dyspnea, unspecified: Secondary | ICD-10-CM | POA: Insufficient documentation

## 2022-05-12 MED ORDER — BENZONATATE 200 MG PO CAPS: 200.0000 mg | ORAL_CAPSULE | Freq: Three times a day (TID) | ORAL | 1 refills | Status: DC | PRN

## 2022-05-12 NOTE — Patient Instructions (Addendum)
Drink fluids and rest  mucinex DM is good for cough and congestion  Nasal saline for congestion as needed  Tylenol for fever or pain or headache  Please alert [place] if symptoms worsen (if severe or short of breath please go to the ER)    Try the tessalon pills for cough  I will send in more   Let's check a chest xray today   Also xray of foot  Elevate foot and use a cold compress if needed  Avoid the shoes that hurt you   Let's re check EKG today     Update if not starting to improve in a week or if worsening

## 2022-05-12 NOTE — Progress Notes (Signed)
Subjective:    Patient ID: Erika Evans, female    DOB: [birth year], 88 y.o.   MRN: [id number]  HPI Pt presents for f/u of sob /cough To review EKG   Foot problem from a new shoe (wore it all day) Monday  Foot hurts and feels swollen    Wt Readings from Last 3 Encounters:  05/12/22 141 lb (64 kg)  04/05/22 144 lb (65.3 kg)  03/31/22 144 lb (65.3 kg)   28.00 kg/m  Vitals:   05/12/22 1100  BP: 112/64  Pulse: (!) 114  Temp: 98.1 F (36.7 C)  SpO2: 92%    Symptoms started on Saturday after pulling weeds  Prod cough - phlegm is clear usually /once yellow  Mild nasal symptoms    Called ems on 3/2 for cough with sob   EKG noted sinus tachycardia HR of 119 , some PACs Short PR interval Ant T wave abn /non specific  Still some chest congestion  Pnd= clearing throat, scratchy throat  Ears seem ok  Neg covid home test  100.5 temp Sunday  Tired   Otc Benadryl - helps some with pnd  She was tx with doxy and steroids in jan for pneumonia  Unsure if she ever got 100% better   Saw cardiol in 2022 for atypical cp Echo nl with EF 55-60%   DG Foot Complete Left  Result Date: 05/12/2022 CLINICAL DATA:  Left foot pain and sole/arch after wearing mild thinning shoes. EXAM: LEFT FOOT - COMPLETE 3+ VIEW COMPARISON:  Left foot radiographs 01/13/2017 FINDINGS: There is diffuse decreased bone mineralization. There is again moderate hallux valgus. Moderate lateral great toe metatarsophalangeal joint space narrowing with mild lateral degenerative osteophytosis. Mild plantar and posterior calcaneal heel spurs. Mild talonavicular, navicular-cuneiform, and tarsometatarsal joint space narrowing and dorsal osteophytosis. Severe second DIP joint space narrowing with bone-on-bone contact. Mild-to-moderate joint space narrowing of the rest of the interphalangeal joints. No acute fracture is seen.  No dislocation. IMPRESSION: 1. Moderate hallux valgus. 2. Moderate lateral great toe  metatarsophalangeal joint osteoarthritis. 3. Severe second DIP osteoarthritis. Electronically Signed   By: Yvonne Kendall M.D.   On: 05/12/2022 12:00   DG Chest 2 View  Result Date: 05/12/2022 CLINICAL DATA:  Productive cough. Shortness of breath since 05/08/2022 EXAM: CHEST - 2 VIEW COMPARISON:  Chest radiographs 10/24/2020 and 03/20/2013; CT chest 10/25/2020 FINDINGS: Cardiac silhouette is again mildly enlarged. Mediastinal contours are within normal limits with mild calcification within the aortic arch. Mild chronic elevation of the right hemidiaphragm is unchanged. Moderate bilateral mid and lower lung and mild bilateral upper lung interstitial thickening appears unchanged from 10/24/2020 and chronic. No new acute airspace opacity is seen. No pleural effusion pneumothorax. Moderate multilevel degenerative disc changes of the thoracic spine with anterior ankylosis of multiple midthoracic spine vertebral bodies. IMPRESSION: 1. No acute lung process. 2. Chronic moderate bilateral mid and lower lung and mild bilateral upper lung interstitial thickening, unchanged from 10/24/2020. This appears to represent chronic scarring. Electronically Signed   By: Yvonne Kendall M.D.   On: 05/12/2022 11:59    Patient Active Problem List   Diagnosis Date Noted   Dyspnea 05/12/2022   Viral URI with cough 05/12/2022   Left foot pain 05/12/2022   Current use of proton pump inhibitor 07/03/2021   Allergic rhinitis 07/03/2021   Erosive osteoarthritis of both hands 03/11/2021   Aortic atherosclerosis ([place]) 11/05/2020   Hypokalemia 08/22/2019   Positive fecal immunochemical test 02/15/2019   Venous insufficiency  of both lower extremities 12/21/2017   Routine general medical examination at a health care facility 01/28/2015   Screening for colon cancer 01/28/2015   Estrogen deficiency 01/28/2015   Encounter for Medicare annual wellness exam 11/26/2012   GERD (gastroesophageal reflux disease) 04/21/2011   OA  (osteoarthritis) 04/21/2011   Hyperlipidemia 04/21/2011   Osteopenia 04/21/2011   Past Medical History:  Diagnosis Date   Asthma    Cataract 2019   resolved with surgery   Congenital fusion of cervical spine    Elevated liver enzymes    Foot pain    Left; nerve problem; procedure   GERD (gastroesophageal reflux disease)    Hepatitis B carrier ([place])    Positive hep B test at [facility]   Hyperlipidemia    Mild   Osteoarthritis    Osteopenia    Pneumonia 2004   Past Surgical History:  Procedure Laterality Date   "Blocked kidney"     BACK SURGERY     CARDIOVASCULAR STRESS TEST  9/07   Neg   CATARACT EXTRACTION W/ INTRAOCULAR LENS IMPLANT Right 05/06/2017   Dr. Tommy Rainwater   CATARACT EXTRACTION W/ INTRAOCULAR LENS IMPLANT Left 05/20/2017   Dr. Tommy Rainwater   COLONOSCOPY  9/06   Diverticulosis, hems   COLONOSCOPY  04/04/2019   Dexa     Osteopenis 9/01, improved 10/03, decreased BMD 5/06   PARTIAL HYSTERECTOMY     UPPER GASTROINTESTINAL ENDOSCOPY  04/04/2019   US ECHOCARDIOGRAPHY  7/03   Abd-normal   US RENAL/AORTA  2007   Neg   Social History   Tobacco Use   Smoking status: Never   Smokeless tobacco: Never  Vaping Use   Vaping Use: Never used  Substance Use Topics   Alcohol use: No    Alcohol/week: 0.0 standard drinks of alcohol   Drug use: No   Family History  Problem Relation Age of Onset   Lung cancer Father    Cancer Sister        Bladder   Dementia Sister    Stroke Brother    Leukemia Brother    Heart attack Brother    Colon cancer Neg Hx    Colon polyps Neg Hx    Esophageal cancer Neg Hx    Rectal cancer Neg Hx    Stomach cancer Neg Hx    Allergies  Allergen Reactions   Alendronate Sodium     REACTION: acid reflux   Iodinated Contrast Media Other (See Comments)   Miacalcin [Calcitonin ([place])] Other (See Comments)    Pt thinks the spray gave her a staph infection in her nose    Pepcid [Famotidine] Diarrhea   Raloxifene     REACTION: severe muscle  pain and weakness   Current Outpatient Medications on File Prior to Visit  Medication Sig Dispense Refill   acetaminophen (TYLENOL) 325 MG tablet Take 2 tablets (650 mg total) by mouth every 4 (four) hours as needed for mild pain or moderate pain (or Fever >/= 101).     albuterol (PROVENTIL HFA;VENTOLIN HFA) 108 (90 BASE) MCG/ACT inhaler Inhale 2 puffs into the lungs every 4 (four) hours as needed for wheezing or shortness of breath. 1 Inhaler 3   aspirin 81 MG tablet Take 81 mg by mouth daily.       Bepotastine Besilate 1.5 % SOLN Place 1 drop into both eyes as needed.     Cholecalciferol (VITAMIN D-3) 1000 UNITS CAPS Take 2 capsules by mouth daily.     fish oil-omega-3  fatty acids 1000 MG capsule Take 1 g by mouth 2 (two) times daily.      Fluticasone-Salmeterol (ADVAIR) 250-50 MCG/DOSE AEPB Inhale 1 puff into the lungs every 12 (twelve) hours.      montelukast (SINGULAIR) 10 MG tablet Take 10 mg by mouth daily.       omeprazole (PRILOSEC) 20 MG capsule Take 1 capsule (20 mg total) by mouth 2 (two) times daily before a meal. 180 capsule 3   levocetirizine (XYZAL) 5 MG tablet Take 5 mg by mouth at bedtime. (Patient not taking: Reported on 05/12/2022)     No current facility-administered medications on file prior to visit.    Review of Systems  Constitutional:  Positive for appetite change and fatigue. Negative for fever.  HENT:  Positive for congestion, postnasal drip, rhinorrhea, sinus pressure, sneezing and sore throat. Negative for ear pain.   Eyes:  Negative for pain and discharge.  Respiratory:  Positive for cough and shortness of breath. Negative for wheezing and stridor.   Cardiovascular:  Negative for chest pain.  Gastrointestinal:  Negative for diarrhea, nausea and vomiting.  Genitourinary:  Negative for frequency, hematuria and urgency.  Musculoskeletal:  Positive for arthralgias. Negative for myalgias.  Skin:  Negative for rash.  Neurological:  Positive for headaches. Negative  for dizziness, weakness and light-headedness.  Psychiatric/Behavioral:  Negative for confusion and dysphoric mood.        Objective:   Physical Exam Constitutional:      General: She is not in acute distress.    Appearance: Normal appearance. She is well-developed and normal weight. She is not ill-appearing or diaphoretic.  HENT:     Head: Normocephalic and atraumatic.     Comments: No sinus tenderness    Right Ear: Tympanic membrane and ear canal normal.     Left Ear: Tympanic membrane and ear canal normal.     Nose: Rhinorrhea present.     Mouth/Throat:     Mouth: Mucous membranes are moist.     Pharynx: No oropharyngeal exudate or posterior oropharyngeal erythema.  Eyes:     General:        Right eye: No discharge.        Left eye: No discharge.     Conjunctiva/sclera: Conjunctivae normal.     Pupils: Pupils are equal, round, and reactive to light.  Neck:     Thyroid: No thyromegaly.     Vascular: No carotid bruit or JVD.  Cardiovascular:     Rate and Rhythm: Regular rhythm. Tachycardia present.     Heart sounds: Normal heart sounds.     No gallop.  Pulmonary:     Effort: Pulmonary effort is normal. No respiratory distress.     Breath sounds: Rhonchi present. No wheezing or rales.     Comments: Few scattered rhonchi No wheeze No rales or crackles Some upper airway sounds  Chest:     Chest wall: No tenderness.  Abdominal:     General: There is no distension or abdominal bruit.     Palpations: Abdomen is soft.  Musculoskeletal:     Cervical back: Normal range of motion and neck supple.     Right lower leg: No edema.     Left lower leg: No edema.     Left foot: Normal range of motion and normal capillary refill. Swelling, bunion, prominent metatarsal heads and tenderness present. No deformity or crepitus.     Comments: L foot-mild dorsal swelling  Tender under med metatarsal heads  No bruising or skin change Nl rom foot/ankle and toes   LEs No palp cords ,  edema or tenderness  Lymphadenopathy:     Cervical: No cervical adenopathy.  Skin:    General: Skin is warm and dry.     Coloration: Skin is not pale.     Findings: No rash.  Neurological:     Mental Status: She is alert.     Cranial Nerves: No cranial nerve deficit.     Motor: No weakness.     Coordination: Coordination normal.     Deep Tendon Reflexes: Reflexes are normal and symmetric. Reflexes normal.  Psychiatric:        Mood and Affect: Mood is anxious.           Assessment & Plan:   Problem List Items Addressed This Visit       Respiratory   Viral URI with cough - Primary    Pt had bad episode of cough and sob =called ems and this is improved Very reassuring exam Csr notes some scarring /not new/ no pna or other changes  Pulse ox 92% with amb  No wheeze Has alb mdi prin Refilled tessalon  Disc sympt care- also ER precautions See AVS      Relevant Orders   DG Chest 2 View (Completed)     Other   Dyspnea    This was due to cough/ uri with phlegm  Had ems visit-reassuring   EKG-sinus tach, rate 108 with occ ectopic beat, will watch closely  Reassuring lung exam Uri symptoms are improving      Relevant Orders   EKG 12-Lead (Completed)   Left foot pain    Sole of L foot hurts after 1 d of bad shoes Some diffuse swelling  Otherwise reassuring exam  Xr notes bunion and arthritis / no fx This is reassuring  Inst to avoid tight shoes  Elevate/ice prn Update if not starting to improve in a week or if worsening        Relevant Orders   DG Foot Complete Left (Completed)

## 2022-05-12 NOTE — Assessment & Plan Note (Signed)
Pt had bad episode of cough and sob =called ems and this is improved Very reassuring exam Csr notes some scarring /not new/ no pna or other changes  Pulse ox 92% with amb  No wheeze Has alb mdi prin Refilled tessalon  Disc sympt care- also ER precautions See AVS

## 2022-05-12 NOTE — Assessment & Plan Note (Signed)
This was due to cough/ uri with phlegm  Had ems visit-reassuring   EKG-sinus tach, rate 108 with occ ectopic beat, will watch closely  Reassuring lung exam Erika Evans symptoms are improving

## 2022-05-12 NOTE — Assessment & Plan Note (Signed)
Sole of L foot hurts after 1 d of bad shoes Some diffuse swelling  Otherwise reassuring exam  Xr notes bunion and arthritis / no fx This is reassuring  Inst to avoid tight shoes  Elevate/ice prn Update if not starting to improve in a week or if worsening

## 2022-07-05 ENCOUNTER — Encounter: Payer: Self-pay | Admitting: Family Medicine

## 2022-07-05 ENCOUNTER — Ambulatory Visit (INDEPENDENT_AMBULATORY_CARE_PROVIDER_SITE_OTHER): Payer: Medicare Other | Admitting: Family Medicine

## 2022-07-05 VITALS — BP 116/70 | HR 61 | Temp 97.5°F | Ht 59.25 in | Wt 138.4 lb

## 2022-07-05 DIAGNOSIS — K219 Gastro-esophageal reflux disease without esophagitis: Secondary | ICD-10-CM

## 2022-07-05 DIAGNOSIS — M8589 Other specified disorders of bone density and structure, multiple sites: Secondary | ICD-10-CM

## 2022-07-05 DIAGNOSIS — R5382 Chronic fatigue, unspecified: Secondary | ICD-10-CM | POA: Diagnosis not present

## 2022-07-05 DIAGNOSIS — Z0000 Encounter for general adult medical examination without abnormal findings: Secondary | ICD-10-CM

## 2022-07-05 DIAGNOSIS — Z1231 Encounter for screening mammogram for malignant neoplasm of breast: Secondary | ICD-10-CM | POA: Insufficient documentation

## 2022-07-05 DIAGNOSIS — Z79899 Other long term (current) drug therapy: Secondary | ICD-10-CM | POA: Diagnosis not present

## 2022-07-05 DIAGNOSIS — E7800 Pure hypercholesterolemia, unspecified: Secondary | ICD-10-CM | POA: Diagnosis not present

## 2022-07-05 DIAGNOSIS — I700 Atherosclerosis of aorta: Secondary | ICD-10-CM | POA: Diagnosis not present

## 2022-07-05 DIAGNOSIS — R5383 Other fatigue: Secondary | ICD-10-CM | POA: Insufficient documentation

## 2022-07-05 DIAGNOSIS — J301 Allergic rhinitis due to pollen: Secondary | ICD-10-CM

## 2022-07-05 LAB — VITAMIN D 25 HYDROXY (VIT D DEFICIENCY, FRACTURES): VITD: 53.54 ng/mL (ref 30.00–100.00)

## 2022-07-05 LAB — COMPREHENSIVE METABOLIC PANEL
ALT: 11 U/L (ref 0–35)
AST: 16 U/L (ref 0–37)
Albumin: 4 g/dL (ref 3.5–5.2)
Alkaline Phosphatase: 77 U/L (ref 39–117)
BUN: 17 mg/dL (ref 6–23)
CO2: 34 mEq/L — ABNORMAL HIGH (ref 19–32)
Calcium: 9.7 mg/dL (ref 8.4–10.5)
Chloride: 100 mEq/L (ref 96–112)
Creatinine, Ser: 0.72 mg/dL (ref 0.40–1.20)
GFR: 75.96 mL/min (ref >60.00)
Glucose, Bld: 78 mg/dL (ref 70–99)
Potassium: 3.8 mEq/L (ref 3.5–5.1)
Sodium: 143 mEq/L (ref 135–145)
Total Bilirubin: 0.7 mg/dL (ref 0.2–1.2)
Total Protein: 7 g/dL (ref 6.0–8.3)

## 2022-07-05 LAB — CBC WITH DIFFERENTIAL/PLATELET
Basophils Absolute: 0 10*3/uL (ref 0.0–0.1)
Basophils Relative: 0.7 % (ref 0.0–3.0)
Eosinophils Absolute: 0.1 10*3/uL (ref 0.0–0.7)
Eosinophils Relative: 1.4 % (ref 0.0–5.0)
HCT: 40.2 % (ref 36.0–46.0)
Hemoglobin: 13.3 g/dL (ref 12.0–15.0)
Lymphocytes Relative: 35.9 % (ref 12.0–46.0)
Lymphs Abs: 2.4 10*3/uL (ref 0.7–4.0)
MCHC: 33.2 g/dL (ref 30.0–36.0)
MCV: 90.2 fl (ref 78.0–100.0)
Monocytes Absolute: 0.7 10*3/uL (ref 0.1–1.0)
Monocytes Relative: 11 % (ref 3.0–12.0)
Neutro Abs: 3.4 10*3/uL (ref 1.4–7.7)
Neutrophils Relative %: 51 % (ref 43.0–77.0)
Platelets: 216 10*3/uL (ref 150.0–400.0)
RBC: 4.46 Mil/uL (ref 3.87–5.11)
RDW: 14.7 % (ref 11.5–15.5)
WBC: 6.6 10*3/uL (ref 4.0–10.5)

## 2022-07-05 LAB — TSH: TSH: 1.56 u[IU]/mL (ref 0.35–5.50)

## 2022-07-05 LAB — LIPID PANEL
Cholesterol: 214 mg/dL — ABNORMAL HIGH (ref 0–200)
HDL: 60.4 mg/dL (ref >39.00)
LDL Cholesterol: 135 mg/dL — ABNORMAL HIGH (ref 0–99)
NonHDL: 153.56
Total CHOL/HDL Ratio: 4
Triglycerides: 95 mg/dL (ref 0.0–149.0)
VLDL: 19 mg/dL (ref 0.0–40.0)

## 2022-07-05 LAB — VITAMIN B12: Vitamin B-12: 1255 pg/mL — ABNORMAL HIGH (ref 211–911)

## 2022-07-05 MED ORDER — OMEPRAZOLE 20 MG PO CPDR: 20.0000 mg | DELAYED_RELEASE_CAPSULE | Freq: Every day | ORAL | 0 refills | Status: DC

## 2022-07-05 NOTE — Assessment & Plan Note (Signed)
Pt declines statin for cholesterol Bp is fine   No symptoms

## 2022-07-05 NOTE — Assessment & Plan Note (Signed)
Dexa 2018 Has kyphosis  No falls or fx Declines another dexa  Was intol to alendronate, miacalcin and also evista  She it no open to other medication   Disc imp of strength building exercise  Also vit D intake

## 2022-07-05 NOTE — Assessment & Plan Note (Signed)
Reviewed health habits including diet and exercise and skin cancer prevention Reviewed appropriate screening tests for age  Also reviewed health mt list, fam hx and immunization status , as well as social and family history   See HPI Labs reviewed and ordered Amw utd Interested in shingrix/will check on price Will also inquire about Td at pharm or health dept Encouraged flu shot in fall Mammogram ordered-pt will call to schedule Dexa 2018- declines another, no falls or fx, takes vit D  Disc addn of strength building exercise Out aged colon cancer screening - last colonoscopy 2021  PHq score of 2, some fatigue/ has had illness this year Labs ordered today

## 2022-07-05 NOTE — Assessment & Plan Note (Signed)
Both allergies and asthma Worse in spring Sees Dr Ruhenstroth Callas  Continues Advair Xyzal Singulair Albuterol prn

## 2022-07-05 NOTE — Assessment & Plan Note (Signed)
Ongoing May be due to age and also recent illness as well as allergies and asthma this time of year  Sleep is fair  Denies depression  Lab today

## 2022-07-05 NOTE — Assessment & Plan Note (Signed)
Doing better  Was able to cut her omeprazole to 20 mg once daily instead of twice Working on diet  B12 level today

## 2022-07-05 NOTE — Patient Instructions (Addendum)
If you are interested in the new shingles vaccine (Shingrix) - call your local pharmacy to check on coverage and availability  If affordable, get on a wait list at your pharmacy to get the vaccine.  If you want a tetanus shot- also check with pharmacy (or health dept)  For bone health- exercise helps Stay active Walk if you can Add some strength training to your routine, this is important for bone and brain health and can reduce your risk of falls and help your body use insulin properly and regulate weight  Light weights, exercise bands , and internet videos are a good way to start  Yoga (chair or regular), machines , floor exercises or a gym with machines are also good options   Labs today   Take care of yourself !   Continue the vitamin D  Continue the vitamin B12 also   Call to schedule your mammogram You have an order for:  []   2D Mammogram  [x]   3D Mammogram  []   Bone Density     Please call for appointment:   []   [facility]  [address] [place] [address]  [phone]  []   [facility]   [address]  [place], [place] [address]  [phone]  [x]   [facility]      [address] [place], [place]        [phone]         []   [facility]  [address] [place], [place]  [phone]  []  [facility]   [address]   [place], [place] [address]  [phone]  []  [facility]  [address] [place], [place] [address] [phone]    Make sure to wear two piece clothing  No lotions powders or deodorants the day of the appointment Make sure to bring picture ID and insurance card.  Bring list of medications you are currently taking including any supplements.   Schedule your screening mammogram  through MyChart!   Select [facility] imaging sites can now be scheduled through MyChart.  Log into your MyChart account.  Go to 'Visit' (or 'Appointments' if  on mobile App) --> Schedule an  Appointment  Under 'Select a Reason for Visit' choose the Mammogram  Screening option.  Complete the pre-visit questions  and select the time and place that  best fits your schedule

## 2022-07-05 NOTE — Assessment & Plan Note (Signed)
Mammogram ordered at the breast center Pt will call to schedule

## 2022-07-05 NOTE — Assessment & Plan Note (Signed)
Disc goals for lipids and reasons to control them Rev last labs with pt Rev low sat fat diet in detail Declines medication but watching diet more carefully  Takes fish oil Labs ordered today for lipids

## 2022-07-05 NOTE — Progress Notes (Signed)
Subjective:    Patient ID: Erika Evans, female    DOB: [birth year], 88 y.o.   MRN: [id number]  HPI Here for health maintenance exam and to review chronic medical problems    Wt Readings from Last 3 Encounters:  07/05/22 138 lb 6 oz (62.8 kg)  05/12/22 141 lb (64 kg)  04/05/22 144 lb (65.3 kg)   27.71 kg/m  Eats 2 meals per day  Eats fairly healthy She would like to loose a little   Vitals:   07/05/22 1058  BP: 116/70  Pulse: 61  Temp: (!) 97.5 F (36.4 C)  SpO2: 95%    Still struggles with her allergies and asthma  This is a bad time of year  Esp outdoor   Advair  Xyzal Singulair  Albuterol prn  Saw allergist/ Dr Shelby Callas in December   This is her bad season    Still fatigued but gradually getting better Has to take breaks   Immunization History  Administered Date(s) Administered   Fluad Quad(high Dose 65+) 12/12/2018, 01/07/2022   Influenza Split 12/10/2010, 12/03/2011   Influenza, High Dose Seasonal PF 12/13/2016   Influenza,inj,Quad PF,6+ Mos 11/27/2012, 12/11/2013, 01/01/2015, 12/12/2015, 12/14/2017, 12/12/2019   Influenza-Unspecified 03/12/2020   PFIZER(Purple Top)SARS-COV-2 Vaccination 12/23/2020   Pneumococcal Conjugate-13 01/28/2015   Pneumococcal Polysaccharide-23 04/21/2011, 12/23/2020   Td 04/21/2011   Zoster, Live 12/13/2013   Health Maintenance Due  Topic Date Due   Zoster Vaccines- Shingrix (1 of 2) Never done   DTaP/Tdap/Td (2 - Tdap) 04/20/2021   MAMMOGRAM  04/14/2022   Amw was 1/24  Shingrix-interested and affordable   Tetanus vaccine -due/ will check with pharmacy   Mammogram 04/2021  Self breast exam-no lumps or changes   Dexa  01/2017 osteopenia  ([place])  Could not tolerated alendronate or miacalcin , evista Declines another test  Falls: none Fractures:none  Supplements -vit D  Exercise : walking, cleaning house    Colon screening- out aged  Small polyp 2021 colonoscopy /no recall due ot age   Mood      03/31/2022   11:28 AM 03/30/2021    9:05 AM 12/20/2018    2:13 PM 12/14/2017   10:57 AM 12/13/2016   11:29 AM  Depression screen PHQ 2/9  Decreased Interest 0 0 0 0 0  Down, Depressed, Hopeless 1 1 0 2 0  PHQ - 2 Score 1 1 0 2 0  Altered sleeping 1  0 0 0  Tired, decreased energy 0  0 0 0  Change in appetite 0  0 0 0  Feeling bad or failure about yourself  0  0 0 0  Trouble concentrating 0  0 0 0  Moving slowly or fidgety/restless 0  0 0 0  Suicidal thoughts 0  0 0 0  PHQ-9 Score 2  0 2 0  Difficult doing work/chores Not difficult at all  Not difficult at all Not difficult at all Not difficult at all    GERD Omeprazole 20 mg bid - is now able to get by with once daily Lab Results  Component Value Date   VITAMINB12 287 07/03/2021  She takes vitamin B12  (was off of it for a while when she was sick)    Hyperlipidemia Lab Results  Component Value Date   CHOL 211 (H) 07/03/2021   HDL 61.60 07/03/2021   LDLCALC 134 (H) 07/03/2021   LDLDIRECT 123.5 11/27/2012   TRIG 79.0 07/03/2021   CHOLHDL 3 07/03/2021   Due for  labs  Declines medication for this  Takes fish oil   Patient Active Problem List   Diagnosis Date Noted   Fatigue 07/05/2022   Encounter for screening mammogram for breast cancer 07/05/2022   Dyspnea 05/12/2022   Left foot pain 05/12/2022   Current use of proton pump inhibitor 07/03/2021   Allergic rhinitis 07/03/2021   Erosive osteoarthritis of both hands 03/11/2021   Aortic atherosclerosis (HCC) 11/05/2020   Hypokalemia 08/22/2019   Venous insufficiency of both lower extremities 12/21/2017   Routine general medical examination at a health care facility 01/28/2015   Screening for colon cancer 01/28/2015   Estrogen deficiency 01/28/2015   Encounter for Medicare annual wellness exam 11/26/2012   GERD (gastroesophageal reflux disease) 04/21/2011   OA (osteoarthritis) 04/21/2011   Hyperlipidemia 04/21/2011   Osteopenia 04/21/2011   Past Medical History:   Diagnosis Date   Asthma    Cataract 2019   resolved with surgery   Congenital fusion of cervical spine    Elevated liver enzymes    Foot pain    Left; nerve problem; procedure   GERD (gastroesophageal reflux disease)    Hepatitis B carrier (HCC)    Positive hep B test at [facility]   Hyperlipidemia    Mild   Osteoarthritis    Osteopenia    Pneumonia 2004   Past Surgical History:  Procedure Laterality Date   "Blocked kidney"     BACK SURGERY     CARDIOVASCULAR STRESS TEST  9/07   Neg   CATARACT EXTRACTION W/ INTRAOCULAR LENS IMPLANT Right 05/06/2017   Dr. Darel Hong   CATARACT EXTRACTION W/ INTRAOCULAR LENS IMPLANT Left 05/20/2017   Dr. Darel Hong   COLONOSCOPY  9/06   Diverticulosis, hems   COLONOSCOPY  04/04/2019   Dexa     Osteopenis 9/01, improved 10/03, decreased BMD 5/06   PARTIAL HYSTERECTOMY     UPPER GASTROINTESTINAL ENDOSCOPY  04/04/2019   US ECHOCARDIOGRAPHY  7/03   Abd-normal   US RENAL/AORTA  2007   Neg   Social History   Tobacco Use   Smoking status: Never   Smokeless tobacco: Never  Vaping Use   Vaping Use: Never used  Substance Use Topics   Alcohol use: No    Alcohol/week: 0.0 standard drinks of alcohol   Drug use: No   Family History  Problem Relation Age of Onset   Lung cancer Father    Cancer Sister        Bladder   Dementia Sister    Stroke Brother    Leukemia Brother    Heart attack Brother    Colon cancer Neg Hx    Colon polyps Neg Hx    Esophageal cancer Neg Hx    Rectal cancer Neg Hx    Stomach cancer Neg Hx    Allergies  Allergen Reactions   Alendronate Sodium     REACTION: acid reflux   Iodinated Contrast Media Other (See Comments)   Miacalcin [Calcitonin ([place])] Other (See Comments)    Pt thinks the spray gave her a staph infection in her nose    Pepcid [Famotidine] Diarrhea   Raloxifene     REACTION: severe muscle pain and weakness   Current Outpatient Medications on File Prior to Visit  Medication Sig Dispense Refill    acetaminophen (TYLENOL) 325 MG tablet Take 2 tablets (650 mg total) by mouth every 4 (four) hours as needed for mild pain or moderate pain (or Fever >/= 101).     albuterol (PROVENTIL  HFA;VENTOLIN HFA) 108 (90 BASE) MCG/ACT inhaler Inhale 2 puffs into the lungs every 4 (four) hours as needed for wheezing or shortness of breath. 1 Inhaler 3   aspirin 81 MG tablet Take 81 mg by mouth daily.       Bepotastine Besilate 1.5 % SOLN Place 1 drop into both eyes as needed.     Cholecalciferol (VITAMIN D-3) 1000 UNITS CAPS Take 2 capsules by mouth daily.     fish oil-omega-3 fatty acids 1000 MG capsule Take 1 g by mouth 2 (two) times daily.      Fluticasone-Salmeterol (ADVAIR) 250-50 MCG/DOSE AEPB Inhale 1 puff into the lungs every 12 (twelve) hours.      levocetirizine (XYZAL) 5 MG tablet Take 5 mg by mouth at bedtime.     montelukast (SINGULAIR) 10 MG tablet Take 10 mg by mouth daily.       No current facility-administered medications on file prior to visit.     Review of Systems  Constitutional:  Positive for fatigue. Negative for activity change, appetite change, fever and unexpected weight change.  HENT:  Positive for postnasal drip and rhinorrhea. Negative for congestion, ear pain, sinus pressure and sore throat.   Eyes:  Negative for pain, redness and visual disturbance.  Respiratory:  Negative for cough, shortness of breath and wheezing.   Cardiovascular:  Negative for chest pain and palpitations.  Gastrointestinal:  Negative for abdominal pain, blood in stool, constipation and diarrhea.  Endocrine: Negative for polydipsia and polyuria.  Genitourinary:  Negative for dysuria, frequency and urgency.  Musculoskeletal:  Negative for arthralgias, back pain and myalgias.  Skin:  Negative for pallor and rash.  Allergic/Immunologic: Negative for environmental allergies.  Neurological:  Negative for dizziness, syncope and headaches.  Hematological:  Negative for adenopathy. Does not bruise/bleed  easily.  Psychiatric/Behavioral:  Negative for decreased concentration and dysphoric mood. The patient is not nervous/anxious.        Objective:   Physical Exam Constitutional:      General: She is not in acute distress.    Appearance: Normal appearance. She is well-developed. She is not ill-appearing or diaphoretic.  HENT:     Head: Normocephalic and atraumatic.     Right Ear: Tympanic membrane, ear canal and external ear normal.     Left Ear: Tympanic membrane, ear canal and external ear normal.     Nose: Nose normal. No congestion.     Mouth/Throat:     Mouth: Mucous membranes are moist.     Pharynx: Oropharynx is clear. No posterior oropharyngeal erythema.  Eyes:     General: No scleral icterus.    Extraocular Movements: Extraocular movements intact.     Conjunctiva/sclera: Conjunctivae normal.     Pupils: Pupils are equal, round, and reactive to light.  Neck:     Thyroid: No thyromegaly.     Vascular: No carotid bruit or JVD.  Cardiovascular:     Rate and Rhythm: Normal rate and regular rhythm.     Pulses: Normal pulses.     Heart sounds: Normal heart sounds.     No gallop.  Pulmonary:     Effort: Pulmonary effort is normal. No respiratory distress.     Breath sounds: Normal breath sounds. No wheezing.     Comments: Good air exch Chest:     Chest wall: No tenderness.  Abdominal:     General: Bowel sounds are normal. There is no distension or abdominal bruit.     Palpations: Abdomen is soft. There  is no mass.     Tenderness: There is no abdominal tenderness.     Hernia: No hernia is present.  Genitourinary:    Comments: Breast exam: No mass, nodules, thickening, tenderness, bulging, retraction, inflamation, nipple discharge or skin changes noted.  No axillary or clavicular LA.     Musculoskeletal:        General: No tenderness. Normal range of motion.     Cervical back: Normal range of motion and neck supple. No rigidity. No muscular tenderness.     Right lower leg:  No edema.     Left lower leg: No edema.     Comments: No kyphosis   Lymphadenopathy:     Cervical: No cervical adenopathy.  Skin:    General: Skin is warm and dry.     Coloration: Skin is not pale.     Findings: No erythema or rash.     Comments: Solar lentigines diffusely Some scattered sks   Neurological:     Mental Status: She is alert. Mental status is at baseline.     Cranial Nerves: No cranial nerve deficit.     Motor: No abnormal muscle tone.     Coordination: Coordination normal.     Gait: Gait normal.     Deep Tendon Reflexes: Reflexes are normal and symmetric. Reflexes normal.  Psychiatric:        Mood and Affect: Mood normal.        Cognition and Memory: Cognition and memory normal.           Assessment & Plan:   Problem List Items Addressed This Visit       Cardiovascular and Mediastinum   Aortic atherosclerosis (HCC)    Pt declines statin for cholesterol Bp is fine   No symptoms       Relevant Orders   Lipid panel     Respiratory   Allergic rhinitis    Both allergies and asthma Worse in spring Sees Dr Camas Callas  Continues Advair Xyzal Singulair Albuterol prn        Digestive   GERD (gastroesophageal reflux disease)    Doing better  Was able to cut her omeprazole to 20 mg once daily instead of twice Working on diet  B12 level today       Relevant Medications   omeprazole (PRILOSEC) 20 MG capsule     Musculoskeletal and Integument   Osteopenia    Dexa 2018 Has kyphosis  No falls or fx Declines another dexa  Was intol to alendronate, miacalcin and also evista  She it no open to other medication   Disc imp of strength building exercise  Also vit D intake         Other   Current use of proton pump inhibitor    B12 level added to labs as well as vit D Is taking otc B12 and vit D       Relevant Orders   VITAMIN D 25 Hydroxy (Vit-D Deficiency, Fractures)   Vitamin B12   Encounter for screening mammogram for breast cancer     Mammogram ordered at the breast center Pt will call to schedule       Relevant Orders   MM 3D SCREENING MAMMOGRAM BILATERAL BREAST   Fatigue    Ongoing May be due to age and also recent illness as well as allergies and asthma this time of year  Sleep is fair  Denies depression  Lab today      Relevant Orders  TSH   CBC with Differential/Platelet   VITAMIN D 25 Hydroxy (Vit-D Deficiency, Fractures)   Vitamin B12   Hyperlipidemia    Disc goals for lipids and reasons to control them Rev last labs with pt Rev low sat fat diet in detail Declines medication but watching diet more carefully  Takes fish oil Labs ordered today for lipids      Relevant Orders   TSH   Lipid panel   Comprehensive metabolic panel   Routine general medical examination at a health care facility - Primary    Reviewed health habits including diet and exercise and skin cancer prevention Reviewed appropriate screening tests for age  Also reviewed health mt list, fam hx and immunization status , as well as social and family history   See HPI Labs reviewed and ordered Amw utd Interested in shingrix/will check on price Will also inquire about Td at pharm or health dept Encouraged flu shot in fall Mammogram ordered-pt will call to schedule Dexa 2018- declines another, no falls or fx, takes vit D  Disc addn of strength building exercise Out aged colon cancer screening - last colonoscopy 2021  PHq score of 2, some fatigue/ has had illness this year Labs ordered today

## 2022-07-05 NOTE — Assessment & Plan Note (Signed)
B12 level added to labs as well as vit D Is taking otc B12 and vit D

## 2022-07-06 ENCOUNTER — Encounter: Payer: Self-pay | Admitting: *Deleted

## 2022-07-29 ENCOUNTER — Ambulatory Visit
Admission: RE | Admit: 2022-07-29 | Discharge: 2022-07-29 | Disposition: A | Discharge status: Home / Self Care | Payer: Medicare Other | Source: Ambulatory Visit | Attending: Family Medicine | Admitting: Family Medicine

## 2022-07-29 DIAGNOSIS — Z1231 Encounter for screening mammogram for malignant neoplasm of breast: Secondary | ICD-10-CM

## 2022-08-19 ENCOUNTER — Other Ambulatory Visit: Payer: Self-pay | Admitting: Family Medicine

## 2022-11-29 ENCOUNTER — Encounter: Payer: Self-pay | Admitting: Family Medicine

## 2022-11-29 ENCOUNTER — Ambulatory Visit: Payer: Medicare Other | Admitting: Family Medicine

## 2022-11-29 ENCOUNTER — Telehealth: Payer: Self-pay

## 2022-11-29 ENCOUNTER — Ambulatory Visit (INDEPENDENT_AMBULATORY_CARE_PROVIDER_SITE_OTHER)
Admission: RE | Admit: 2022-11-29 | Discharge: 2022-11-29 | Disposition: A | Discharge status: Home / Self Care | Payer: Medicare Other | Source: Ambulatory Visit | Attending: Family Medicine | Admitting: Family Medicine

## 2022-11-29 VITALS — BP 136/72 | HR 64 | Temp 98.1°F | Ht 59.25 in | Wt 140.0 lb

## 2022-11-29 DIAGNOSIS — M25531 Pain in right wrist: Secondary | ICD-10-CM

## 2022-11-29 DIAGNOSIS — M8589 Other specified disorders of bone density and structure, multiple sites: Secondary | ICD-10-CM | POA: Diagnosis not present

## 2022-11-29 DIAGNOSIS — M79641 Pain in right hand: Secondary | ICD-10-CM

## 2022-11-29 MED ORDER — DICLOFENAC SODIUM 75 MG PO TBEC: 75.0000 mg | DELAYED_RELEASE_TABLET | Freq: Two times a day (BID) | ORAL | 0 refills | Status: DC

## 2022-11-29 MED ORDER — TRAMADOL HCL 50 MG PO TABS: 50.0000 mg | ORAL_TABLET | Freq: Three times a day (TID) | ORAL | 0 refills | Status: AC | PRN

## 2022-11-29 NOTE — Progress Notes (Signed)
Patient ID: Erika Evans, female    DOB: 1934/05/29, 88 y.o.   MRN: [id number]  This visit was conducted in person.  BP 136/72   Pulse 64   Temp 98.1 F (36.7 C) (Temporal)   Ht 4' 11.25" (1.505 m)   Wt 140 lb (63.5 kg)   SpO2 94%   BMI 28.04 kg/m    CC:  Chief Complaint  Patient presents with   Hand Pain    Right hand pain into tips of fingers.  Started Saturday    Subjective:   HPI: Erika Evans is a 88 y.o. female presenting on 11/29/2022 for Hand Pain (Right hand pain into tips of fingers. /Started Saturday)  New onset pain in right hand, ongoing for 3 days.  She has been sweeping dirveway lately... both hands have been aching some.  When getting off bed 3 days ago she pushed up on her right hand/ wrist.. had sudden worsening of pain.  Pain goes from mid forearm to tips of fingers.Marland Kitchen swelling mainly at wrist.  Right wrist painful, swollen, red.   Has history of OA in hand, history of osteopenia   Using tylenol for pain.  Has iced it and elevated it some.     Relevant past medical, surgical, family and social history reviewed and updated as indicated. Interim medical history since our last visit reviewed. Allergies and medications reviewed and updated. Outpatient Medications Prior to Visit  Medication Sig Dispense Refill   acetaminophen (TYLENOL) 325 MG tablet Take 2 tablets (650 mg total) by mouth every 4 (four) hours as needed for mild pain or moderate pain (or Fever >/= 101).     albuterol (PROVENTIL HFA;VENTOLIN HFA) 108 (90 BASE) MCG/ACT inhaler Inhale 2 puffs into the lungs every 4 (four) hours as needed for wheezing or shortness of breath. 1 Inhaler 3   aspirin 81 MG tablet Take 81 mg by mouth daily.       Bepotastine Besilate 1.5 % SOLN Place 1 drop into both eyes as needed.     Cholecalciferol (VITAMIN D-3) 1000 UNITS CAPS Take 2 capsules by mouth daily.     fish oil-omega-3 fatty acids 1000 MG capsule Take 1 g by mouth 2 (two) times daily.       Fluticasone-Salmeterol (ADVAIR) 250-50 MCG/DOSE AEPB Inhale 1 puff into the lungs every 12 (twelve) hours.      levocetirizine (XYZAL) 5 MG tablet Take 5 mg by mouth at bedtime.     montelukast (SINGULAIR) 10 MG tablet Take 10 mg by mouth daily.       omeprazole (PRILOSEC) 20 MG capsule Take 1 capsule (20 mg total) by mouth daily. 90 capsule 1   No facility-administered medications prior to visit.     Per HPI unless specifically indicated in ROS section below Review of Systems  Constitutional:  Negative for fatigue and fever.  HENT:  Negative for congestion.   Eyes:  Negative for pain.  Respiratory:  Negative for cough and shortness of breath.   Cardiovascular:  Negative for chest pain, palpitations and leg swelling.  Gastrointestinal:  Negative for abdominal pain.  Genitourinary:  Negative for dysuria and vaginal bleeding.  Musculoskeletal:  Negative for back pain.  Neurological:  Negative for syncope, light-headedness and headaches.  Psychiatric/Behavioral:  Negative for dysphoric mood.    Objective:  BP 136/72   Pulse 64   Temp 98.1 F (36.7 C) (Temporal)   Ht 4' 11.25" (1.505 m)   Wt 140 lb (63.5 kg)  SpO2 94%   BMI 28.04 kg/m   Wt Readings from Last 3 Encounters:  11/29/22 140 lb (63.5 kg)  07/05/22 138 lb 6 oz (62.8 kg)  05/12/22 141 lb (64 kg)      Physical Exam Constitutional:      General: She is not in acute distress.    Appearance: Normal appearance. She is well-developed. She is not ill-appearing or toxic-appearing.  HENT:     Head: Normocephalic.     Right Ear: Hearing, tympanic membrane, ear canal and external ear normal. Tympanic membrane is not erythematous, retracted or bulging.     Left Ear: Hearing, tympanic membrane, ear canal and external ear normal. Tympanic membrane is not erythematous, retracted or bulging.     Nose: No mucosal edema or rhinorrhea.     Right Sinus: No maxillary sinus tenderness or frontal sinus tenderness.     Left Sinus: No  maxillary sinus tenderness or frontal sinus tenderness.     Mouth/Throat:     Mouth: Oropharynx is clear and moist and mucous membranes are normal.     Pharynx: Uvula midline.  Eyes:     General: Lids are normal. Lids are everted, no foreign bodies appreciated.     Extraocular Movements: EOM normal.     Conjunctiva/sclera: Conjunctivae normal.     Pupils: Pupils are equal, round, and reactive to light.  Neck:     Thyroid: No thyroid mass or thyromegaly.     Vascular: No carotid bruit.     Trachea: Trachea normal.  Cardiovascular:     Rate and Rhythm: Normal rate and regular rhythm.     Pulses: Normal pulses.     Heart sounds: Normal heart sounds, S1 normal and S2 normal. No murmur heard.    No friction rub. No gallop.  Pulmonary:     Effort: Pulmonary effort is normal. No tachypnea or respiratory distress.     Breath sounds: Normal breath sounds. No decreased breath sounds, wheezing, rhonchi or rales.  Abdominal:     General: Bowel sounds are normal.     Palpations: Abdomen is soft.     Tenderness: There is no abdominal tenderness.  Musculoskeletal:     Right forearm: Swelling, tenderness and bony tenderness present.     Right wrist: Swelling and tenderness present. Decreased range of motion.     Right hand: Swelling, tenderness and bony tenderness present. Decreased range of motion.     Cervical back: Normal range of motion and neck supple.     Comments: See photo  Skin:    General: Skin is warm, dry and intact.     Findings: No rash.  Neurological:     Mental Status: She is alert.  Psychiatric:        Mood and Affect: Mood is not anxious or depressed.        Speech: Speech normal.        Behavior: Behavior normal. Behavior is cooperative.        Thought Content: Thought content normal.        Cognition and Memory: Cognition and memory normal.        Judgment: Judgment normal.       Results for orders placed or performed in visit on 07/05/22  TSH  Result Value Ref  Range   TSH 1.56 0.35 - 5.50 uIU/mL  Lipid panel  Result Value Ref Range   Cholesterol 214 (H) 0 - 200 mg/dL   Triglycerides 09.8 0.0 - 149.0 mg/dL  HDL 60.40 >39.00 mg/dL   VLDL 41.3 0.0 - 24.4 mg/dL   LDL Cholesterol 010 (H) 0 - 99 mg/dL   Total CHOL/HDL Ratio 4    NonHDL 153.56   Comprehensive metabolic panel  Result Value Ref Range   Sodium 143 135 - 145 mEq/L   Potassium 3.8 3.5 - 5.1 mEq/L   Chloride 100 96 - 112 mEq/L   CO2 34 (H) 19 - 32 mEq/L   Glucose, Bld 78 70 - 99 mg/dL   BUN 17 6 - 23 mg/dL   Creatinine, Ser 2.72 0.40 - 1.20 mg/dL   Total Bilirubin 0.7 0.2 - 1.2 mg/dL   Alkaline Phosphatase 77 39 - 117 U/L   AST 16 0 - 37 U/L   ALT 11 0 - 35 U/L   Total Protein 7.0 6.0 - 8.3 g/dL   Albumin 4.0 3.5 - 5.2 g/dL   GFR 53.66 >44.03 mL/min   Calcium 9.7 8.4 - 10.5 mg/dL  CBC with Differential/Platelet  Result Value Ref Range   WBC 6.6 4.0 - 10.5 K/uL   RBC 4.46 3.87 - 5.11 Mil/uL   Hemoglobin 13.3 12.0 - 15.0 g/dL   HCT 47.4 25.9 - 56.3 %   MCV 90.2 78.0 - 100.0 fl   MCHC 33.2 30.0 - 36.0 g/dL   RDW 87.5 64.3 - 32.9 %   Platelets 216.0 150.0 - 400.0 K/uL   Neutrophils Relative % 51.0 43.0 - 77.0 %   Lymphocytes Relative 35.9 12.0 - 46.0 %   Monocytes Relative 11.0 3.0 - 12.0 %   Eosinophils Relative 1.4 0.0 - 5.0 %   Basophils Relative 0.7 0.0 - 3.0 %   Neutro Abs 3.4 1.4 - 7.7 K/uL   Lymphs Abs 2.4 0.7 - 4.0 K/uL   Monocytes Absolute 0.7 0.1 - 1.0 K/uL   Eosinophils Absolute 0.1 0.0 - 0.7 K/uL   Basophils Absolute 0.0 0.0 - 0.1 K/uL  VITAMIN D 25 Hydroxy (Vit-D Deficiency, Fractures)  Result Value Ref Range   VITD 53.54 30.00 - 100.00 ng/mL  Vitamin B12  Result Value Ref Range   Vitamin B-12 1,255 (H) 211 - 911 pg/mL    Assessment and Plan Concern for possible fracture versus osteoarthritis flare versus less likely gout. Diffuse pain and proximal portion of hand and wrist distal forearm primarily on the ulnar side.  Significant decreased range of  motion wrist, significant swelling. Initial read of x-rays showed extensive arthritis but no clear fracture. Will await radiology just read. Will treat with diclofenac 75 mg p.o. twice daily with food for pain and inflammation.  Tramadol given with precautions for breakthrough pain. Ice regularly, elevate, sling provided for immobilization. Depending on injury will refer to Ortho, patient requests [facility] and has not seen Ortho in the past.  Right wrist pain -     DG Wrist Complete Right; Future -     DG Forearm Right; Future -     DG Hand Complete Right; Future  Right hand pain -     DG Wrist Complete Right; Future -     DG Forearm Right; Future -     DG Hand Complete Right; Future  Osteopenia of multiple sites -     DG Wrist Complete Right; Future -     DG Forearm Right; Future -     DG Hand Complete Right; Future  Other orders -     Diclofenac Sodium; Take 1 tablet (75 mg total) by mouth 2 (two) times daily.  Dispense: 30 tablet; Refill: 0 -     traMADol HCl; Take 1 tablet (50 mg total) by mouth every 8 (eight) hours as needed for up to 5 days.  Dispense: 15 tablet; Refill: 0    No follow-ups on file.   Kerby Nora, MD

## 2022-11-29 NOTE — Telephone Encounter (Signed)
Noted  

## 2022-11-29 NOTE — Telephone Encounter (Signed)
Pt scheduled for appointment 11/29/22 @ 10:20a

## 2022-11-29 NOTE — Patient Instructions (Signed)
We will call with X-ray results.  Ice wrist, elevated and immobilize.  Start diclofenac for pain and inflammation.  Can use tramadol for breakthrough pain but can make you sleepy feeling.

## 2022-12-20 ENCOUNTER — Telehealth: Payer: Self-pay | Admitting: Family Medicine

## 2022-12-20 NOTE — Telephone Encounter (Signed)
Patient contacted the office regarding R hand pain, states that she saw Dr. Ermalene Searing and a provider from [facility]. Patient says she has had 2 xrays and taken medication and she is still having hand pain. Patient would like to know what Dr. Milinda Antis would suggest? Patient says xrays have not been conclusive as to what is going on with her hand. Please advise patient at [phone].

## 2022-12-20 NOTE — Telephone Encounter (Signed)
I reviewed Dr Daphine Deutscher note and Dr Lorraine Lax note from [facility]  He wondered if this could be tendonitis and tendon injury and suggested consideration of MRI if not improving  Please have her call that office and let them know she is struggling so they can take next steps Thanks for letting me know

## 2022-12-21 NOTE — Telephone Encounter (Signed)
Patient called in to follow up on this. Relayed message below to patient. Patient stated that she reached out to Dr. Lorraine Lax office already and they are assisting her. She was calling to inform Dr. Milinda Antis of this.

## 2023-01-24 ENCOUNTER — Encounter: Payer: Self-pay | Admitting: Internal Medicine

## 2023-01-24 ENCOUNTER — Ambulatory Visit: Payer: Medicare Other | Admitting: Internal Medicine

## 2023-01-24 VITALS — BP 118/78 | HR 111 | Temp 98.5°F | Ht 59.25 in | Wt 137.0 lb

## 2023-01-24 DIAGNOSIS — U071 COVID-19: Secondary | ICD-10-CM | POA: Diagnosis not present

## 2023-01-24 NOTE — Progress Notes (Signed)
Subjective:    Patient ID: Erika Evans, female    DOB: Jan 13, 1935, 88 y.o.   MRN: [id number]  HPI Here due to respiratory symptoms---COVID positive  Symptoms started 11/10 Dripping nose and fatigue Eyes tearing and burning Some cough--productive of thick sputum Some SOB--not new. Sees Dr Fontenelle Callas for asthma. Is on advair. Rarely needs the albuterol  Fever to 102 by 11/14 Has been real sick--did test positive for COVID and the worst spell she has had of COVID--11/16 Woke with wet sweat that night  Current Outpatient Medications on File Prior to Visit  Medication Sig Dispense Refill   acetaminophen (TYLENOL) 325 MG tablet Take 2 tablets (650 mg total) by mouth every 4 (four) hours as needed for mild pain or moderate pain (or Fever >/= 101).     albuterol (PROVENTIL HFA;VENTOLIN HFA) 108 (90 BASE) MCG/ACT inhaler Inhale 2 puffs into the lungs every 4 (four) hours as needed for wheezing or shortness of breath. 1 Inhaler 3   aspirin 81 MG tablet Take 81 mg by mouth daily.       Bepotastine Besilate 1.5 % SOLN Place 1 drop into both eyes as needed.     Cholecalciferol (VITAMIN D-3) 1000 UNITS CAPS Take 2 capsules by mouth daily.     fish oil-omega-3 fatty acids 1000 MG capsule Take 1 g by mouth 2 (two) times daily.      Fluticasone-Salmeterol (ADVAIR) 250-50 MCG/DOSE AEPB Inhale 1 puff into the lungs every 12 (twelve) hours.      levocetirizine (XYZAL) 5 MG tablet Take 5 mg by mouth at bedtime.     meloxicam (MOBIC) 15 MG tablet Take 1 tablet by mouth daily.     montelukast (SINGULAIR) 10 MG tablet Take 10 mg by mouth daily.       omeprazole (PRILOSEC) 20 MG capsule Take 1 capsule (20 mg total) by mouth daily. 90 capsule 1   No current facility-administered medications on file prior to visit.    Allergies  Allergen Reactions   Alendronate Sodium     REACTION: acid reflux   Iodinated Contrast Media Other (See Comments)   Miacalcin [Calcitonin (Salmon)] Other (See Comments)     Pt thinks the spray gave her a staph infection in her nose    Pepcid [Famotidine] Diarrhea   Raloxifene     REACTION: severe muscle pain and weakness    Past Medical History:  Diagnosis Date   Asthma    Cataract 2019   resolved with surgery   Congenital fusion of cervical spine    Elevated liver enzymes    Foot pain    Left; nerve problem; procedure   GERD (gastroesophageal reflux disease)    Hepatitis B carrier (HCC)    Positive hep B test at [facility]   Hyperlipidemia    Mild   Osteoarthritis    Osteopenia    Pneumonia 2004    Past Surgical History:  Procedure Laterality Date   "Blocked kidney"     BACK SURGERY     CARDIOVASCULAR STRESS TEST  9/07   Neg   CATARACT EXTRACTION W/ INTRAOCULAR LENS IMPLANT Right 05/06/2017   Dr. Darel Hong   CATARACT EXTRACTION W/ INTRAOCULAR LENS IMPLANT Left 05/20/2017   Dr. Darel Hong   COLONOSCOPY  9/06   Diverticulosis, hems   COLONOSCOPY  04/04/2019   Dexa     Osteopenis 9/01, improved 10/03, decreased BMD 5/06   PARTIAL HYSTERECTOMY     UPPER GASTROINTESTINAL ENDOSCOPY  04/04/2019   US  ECHOCARDIOGRAPHY  7/03   Abd-normal   US RENAL/AORTA  2007   Neg    Family History  Problem Relation Age of Onset   Lung cancer Father    Cancer Sister        Bladder   Dementia Sister    Stroke Brother    Leukemia Brother    Heart attack Brother    Colon cancer Neg Hx    Colon polyps Neg Hx    Esophageal cancer Neg Hx    Rectal cancer Neg Hx    Stomach cancer Neg Hx     Social History   Socioeconomic History   Marital status: Widowed    Spouse name: Not on file   Number of children: 0   Years of education: Not on file   Highest education level: Not on file  Occupational History    Employer: [employer]    Comment: Retired  Tobacco Use   Smoking status: Never   Smokeless tobacco: Never  Vaping Use   Vaping status: Never Used  Substance and Sexual Activity   Alcohol use: No    Alcohol/week: 0.0 standard drinks of alcohol    Drug use: No   Sexual activity: Not Currently  Other Topics Concern   Not on file  Social History Narrative   Lost husband November 2004.   Lives alone   Is a twin (atually triplet but third (boy) died at birth)   Sometimes walks for exercise.   Retired [employer]   No children   Never smoker, No ETOH, rare caffeine   Social Determinants of Health   Financial Resource Strain: Low Risk  (03/31/2022)   Overall Financial Resource Strain (CARDIA)    Difficulty of Paying Living Expenses: Not hard at all  Food Insecurity: No Food Insecurity (03/31/2022)   Hunger Vital Sign    Worried About Running Out of Food in the Last Year: Never true    Ran Out of Food in the Last Year: Never true  Transportation Needs: No Transportation Needs (03/31/2022)   PRAPARE - Administrator, Civil Service (Medical): No    Lack of Transportation (Non-Medical): No  Physical Activity: Inactive (03/31/2022)   Exercise Vital Sign    Days of Exercise per Week: 0 days    Minutes of Exercise per Session: 0 min  Stress: No Stress Concern Present (03/31/2022)   [employer] of Occupational Health - Occupational Stress Questionnaire    Feeling of Stress : Not at all  Social Connections: Moderately Isolated (03/31/2022)   Social Connection and Isolation Panel [NHANES]    Frequency of Communication with Friends and Family: More than three times a week    Frequency of Social Gatherings with Friends and Family: Once a week    Attends Religious Services: More than 4 times per year    Active Member of [employer] or Organizations: No    Attends Banker Meetings: Never    Marital Status: Widowed  Intimate Partner Violence: Not At Risk (03/31/2022)   Humiliation, Afraid, Rape, and Kick questionnaire    Fear of Current or Ex-Partner: No    Emotionally Abused: No    Physically Abused: No    Sexually Abused: No   Review of Systems Bad pain in right wrist with injury in August--not back to  normal yet--on meloxicam for this No N/V--has to be careful with what she eats (lactose intolerant, sensitive to greasy food)    Objective:   Physical Exam Constitutional:  Appearance: Normal appearance.  HENT:     Head:     Comments: No sinus tenderness    Right Ear: Tympanic membrane and ear canal normal.     Left Ear: Tympanic membrane and ear canal normal.     Mouth/Throat:     Pharynx: No oropharyngeal exudate or posterior oropharyngeal erythema.  Pulmonary:     Effort: Pulmonary effort is normal.     Breath sounds: Normal breath sounds. No wheezing or rales.  Musculoskeletal:     Cervical back: Neck supple.  Lymphadenopathy:     Cervical: No cervical adenopathy.  Neurological:     Mental Status: She is alert.            Assessment & Plan:

## 2023-01-24 NOTE — Assessment & Plan Note (Signed)
Had most severe infection for her (previous COVID infections not as bad) Clearly improved since last week Now 8 days out--too late for paxlovid Discussed analgeiscs--tylenol prn Would consider empiric antibiotic (doxy 100 bid x 7 days) if the productive cough worsens No worsening of her asthma--still on the advair

## 2023-04-04 ENCOUNTER — Ambulatory Visit (INDEPENDENT_AMBULATORY_CARE_PROVIDER_SITE_OTHER): Payer: Medicare Other

## 2023-04-04 VITALS — Ht 65.0 in | Wt 137.0 lb

## 2023-04-04 DIAGNOSIS — Z0000 Encounter for general adult medical examination without abnormal findings: Secondary | ICD-10-CM

## 2023-04-04 NOTE — Progress Notes (Signed)
Subjective:   Erika Evans is a 89 y.o. female who presents for Medicare Annual (Subsequent) preventive examination.  Visit Complete: Virtual I connected with  [name] on 04/04/23 by a audio enabled telemedicine application and verified that I am speaking with the correct person using two identifiers.  Patient Location: Home  Provider Location: Home Office  I discussed the limitations of evaluation and management by telemedicine. The patient expressed understanding and agreed to proceed.  Vital Signs: Because this visit was a virtual/telehealth visit, some criteria may be missing or patient reported. Any vitals not documented were not able to be obtained and vitals that have been documented are patient reported.  Patient Medicare AWV questionnaire was completed by the patient on (not done); I have confirmed that all information answered by patient is correct and no changes since this date.  Cardiac Risk Factors include: advanced age (>66men, >24 women);dyslipidemia;hypertension;sedentary lifestyle    Objective:    Today's Vitals   04/04/23 1008  Weight: 137 lb (62.1 kg)  Height: 5\' 5"  (1.651 m)  PainSc: 0-No pain   Body mass index is 22.8 kg/m.     04/04/2023   10:26 AM 03/31/2022   11:30 AM 03/30/2021    9:01 AM 10/25/2020    8:24 AM 10/24/2020    8:26 PM 08/20/2019    6:54 PM 12/20/2018    2:11 PM  Advanced Directives  Does Patient Have a Medical Advance Directive? Yes Yes No Yes Yes No Yes  Type of Estate agent of [place];Living will Healthcare Power of [place];Living will  Healthcare Power of [place];Living will Healthcare Power of [employer] of [place];Living will  Does patient want to make changes to medical advance directive?  No - Patient declined  No - Patient declined No - Patient declined    Copy of Healthcare Power of Attorney in Chart? Yes - validated most recent copy scanned in chart (See row information)  Yes - validated most recent copy scanned in chart (See row information)  No - copy requested No - copy requested  No - copy requested  Would patient like information on creating a medical advance directive?   Yes (MAU/Ambulatory/Procedural Areas - Information given) No - Patient declined       Current Medications (verified) Outpatient Encounter Medications as of 04/04/2023  Medication Sig   acetaminophen (TYLENOL) 325 MG tablet Take 2 tablets (650 mg total) by mouth every 4 (four) hours as needed for mild pain or moderate pain (or Fever >/= 101).   albuterol (PROVENTIL HFA;VENTOLIN HFA) 108 (90 BASE) MCG/ACT inhaler Inhale 2 puffs into the lungs every 4 (four) hours as needed for wheezing or shortness of breath.   aspirin 81 MG tablet Take 81 mg by mouth daily.     Bepotastine Besilate 1.5 % SOLN Place 1 drop into both eyes as needed.   Cholecalciferol (VITAMIN D-3) 1000 UNITS CAPS Take 2 capsules by mouth daily.   fish oil-omega-3 fatty acids 1000 MG capsule Take 1 g by mouth 2 (two) times daily.    Fluticasone-Salmeterol (ADVAIR) 250-50 MCG/DOSE AEPB Inhale 1 puff into the lungs every 12 (twelve) hours.    levocetirizine (XYZAL) 5 MG tablet Take 5 mg by mouth at bedtime.   meloxicam (MOBIC) 15 MG tablet Take 1 tablet by mouth daily.   montelukast (SINGULAIR) 10 MG tablet Take 10 mg by mouth daily.     omeprazole (PRILOSEC) 20 MG capsule Take 1 capsule (20 mg total) by  mouth daily.   No facility-administered encounter medications on file as of 04/04/2023.   Allergies (verified) Alendronate sodium, Iodinated contrast media, Miacalcin [calcitonin (salmon)], Pepcid [famotidine], and Raloxifene   History: Past Medical History:  Diagnosis Date   Asthma    Cataract 2019   resolved with surgery   Congenital fusion of cervical spine    Elevated liver enzymes    Foot pain    Left; nerve problem; procedure   GERD (gastroesophageal reflux disease)    Hepatitis B carrier (HCC)    Positive hep  B test at [facility]   Hyperlipidemia    Mild   Osteoarthritis    Osteopenia    Pneumonia 2004   Past Surgical History:  Procedure Laterality Date   "Blocked kidney"     BACK SURGERY     CARDIOVASCULAR STRESS TEST  9/07   Neg   CATARACT EXTRACTION W/ INTRAOCULAR LENS IMPLANT Right 05/06/2017   Dr. Darel Hong   CATARACT EXTRACTION W/ INTRAOCULAR LENS IMPLANT Left 05/20/2017   Dr. Darel Hong   COLONOSCOPY  9/06   Diverticulosis, hems   COLONOSCOPY  04/04/2019   Dexa     Osteopenis 9/01, improved 10/03, decreased BMD 5/06   PARTIAL HYSTERECTOMY     UPPER GASTROINTESTINAL ENDOSCOPY  04/04/2019   US ECHOCARDIOGRAPHY  7/03   Abd-normal   US RENAL/AORTA  2007   Neg   Family History  Problem Relation Age of Onset   Lung cancer Father    Cancer Sister        Bladder   Dementia Sister    Stroke Brother    Leukemia Brother    Heart attack Brother    Colon cancer Neg Hx    Colon polyps Neg Hx    Esophageal cancer Neg Hx    Rectal cancer Neg Hx    Stomach cancer Neg Hx    Social History   Socioeconomic History   Marital status: Widowed    Spouse name: Not on file   Number of children: 0   Years of education: Not on file   Highest education level: Not on file  Occupational History    Employer: [employer]    Comment: Retired  Tobacco Use   Smoking status: Never   Smokeless tobacco: Never  Vaping Use   Vaping status: Never Used  Substance and Sexual Activity   Alcohol use: No    Alcohol/week: 0.0 standard drinks of alcohol   Drug use: No   Sexual activity: Not Currently  Other Topics Concern   Not on file  Social History Narrative   Lost husband November 2004.   Lives alone   Is a twin (atually triplet but third (boy) died at birth)   Sometimes walks for exercise.   Retired [employer]   No children   Never smoker, No ETOH, rare caffeine   Social Drivers of Corporate investment banker Strain: Low Risk  (04/04/2023)   Overall Financial Resource Strain  (CARDIA)    Difficulty of Paying Living Expenses: Not hard at all  Food Insecurity: No Food Insecurity (04/04/2023)   Hunger Vital Sign    Worried About Running Out of Food in the Last Year: Never true    Ran Out of Food in the Last Year: Never true  Transportation Needs: No Transportation Needs (04/04/2023)   PRAPARE - Administrator, Civil Service (Medical): No    Lack of Transportation (Non-Medical): No  Physical Activity: Inactive (04/04/2023)   Exercise  Vital Sign    Days of Exercise per Week: 0 days    Minutes of Exercise per Session: 0 min  Stress: No Stress Concern Present (04/04/2023)   [employer] of Occupational Health - Occupational Stress Questionnaire    Feeling of Stress : Not at all  Social Connections: Moderately Isolated (04/04/2023)   Social Connection and Isolation Panel [NHANES]    Frequency of Communication with Friends and Family: More than three times a week    Frequency of Social Gatherings with Friends and Family: Once a week    Attends Religious Services: 1 to 4 times per year    Active Member of [employer] or Organizations: No    Attends Banker Meetings: Never    Marital Status: Widowed    Tobacco Counseling Counseling given: Not Answered  Clinical Intake:  Pre-visit preparation completed: Yes  Pain : No/denies pain Pain Score: 0-No pain   BMI - recorded: 22.8 Nutritional Status: BMI of 19-24  Normal Nutritional Risks: None Diabetes: No  How often do you need to have someone help you when you read instructions, pamphlets, or other written materials from your doctor or pharmacy?: 1 - Never  Interpreter Needed?: No  Comments: lives alone Information entered by :: B.Caidyn Henricksen,LPN   Activities of Daily Living    04/04/2023   10:26 AM  In your present state of health, do you have any difficulty performing the following activities:  Hearing? 0  Vision? 0  Difficulty concentrating or making decisions? 0  Walking or  climbing stairs? 0  Dressing or bathing? 0  Doing errands, shopping? 0  Preparing Food and eating ? N  Using the Toilet? N  In the past six months, have you accidently leaked urine? Y  Do you have problems with loss of bowel control? N  Managing your Medications? N  Managing your Finances? N    Patient Care Team: Tower, Audrie Gallus, MD as PCP - General  Indicate any recent Medical Services you may have received from other than [facility] providers in the past year (date may be approximate).     Assessment:   This is a routine wellness examination for [facility].  Hearing/Vision screen Hearing Screening - Comments:: Adequate hearing per pt Vision Screening - Comments:: Pt says her vision is good after cataract surgery;readers only Dr Dion Body   Goals Addressed             This Visit's Progress    COMPLETED: DIET - EAT MORE FRUITS AND VEGETABLES   On track    Increase physical activity   Not on track    As weather permits, I will attempt to walk at least 30 minutes 2-3 days per week.      Patient Stated   Not on track    12/20/2018, I will start trying to exercise more daily.     COMPLETED: Patient Stated   On track    Would like to lose some weight       Depression Screen    04/04/2023   10:21 AM 03/31/2022   11:28 AM 03/30/2021    9:05 AM 12/20/2018    2:13 PM 12/14/2017   10:57 AM 12/13/2016   11:29 AM 01/28/2015   11:08 AM  PHQ 2/9 Scores  PHQ - 2 Score 1 1 1  0 2 0 0  PHQ- 9 Score  2  0 2 0     Fall Risk    04/04/2023   10:14 AM 03/31/2022   11:31  AM 03/30/2021    9:03 AM 12/20/2018    2:13 PM 12/14/2017   10:57 AM  Fall Risk   Falls in the past year? 0 0 0 0 No  Number falls in past yr: 0 0 0    Injury with Fall? 0 0 0    Risk for fall due to : No Fall Risks No Fall Risks  Medication side effect;Impaired balance/gait   Follow up Education provided;Falls prevention discussed Falls prevention discussed;Falls evaluation completed Falls prevention discussed Falls  evaluation completed;Falls prevention discussed     MEDICARE RISK AT HOME: Medicare Risk at Home Any stairs in or around the home?: Yes If so, are there any without handrails?: Yes Home free of loose throw rugs in walkways, pet beds, electrical cords, etc?: Yes Adequate lighting in your home to reduce risk of falls?: Yes Life alert?: Yes Use of a cane, walker or w/c?: No Grab bars in the bathroom?: Yes Shower chair or bench in shower?: Yes Elevated toilet seat or a handicapped toilet?: No  TIMED UP AND GO:  Was the test performed?  No    Cognitive Function:    12/20/2018    2:23 PM 12/14/2017   11:08 AM 12/13/2016   11:29 AM  MMSE - Mini Mental State Exam  Orientation to time 5 5 5   Orientation to Place 5 5 5   Registration 3 3 3   Attention/ Calculation 5 0 0  Recall 3 3 3   Language- name 2 objects  0 0  Language- repeat 1 1 1   Language- follow 3 step command  3 3  Language- read & follow direction  0 0  Write a sentence  0 0  Copy design  0 0  Total score  20 20        04/04/2023   10:27 AM 03/31/2022   11:36 AM  6CIT Screen  What Year? 0 points 0 points  What month? 0 points 0 points  What time? 0 points 0 points  Count back from 20 0 points 4 points  Months in reverse 0 points 0 points  Repeat phrase 0 points 0 points  Total Score 0 points 4 points    Immunizations Immunization History  Administered Date(s) Administered   Fluad Quad(high Dose 65+) 12/12/2018, 01/07/2022   Influenza Split 12/10/2010, 12/03/2011   Influenza, High Dose Seasonal PF 12/13/2016   Influenza,inj,Quad PF,6+ Mos 11/27/2012, 12/11/2013, 01/01/2015, 12/12/2015, 12/14/2017, 12/12/2019   Influenza-Unspecified 03/12/2020   PFIZER(Purple Top)SARS-COV-2 Vaccination 12/23/2020   Pneumococcal Conjugate-13 01/28/2015   Pneumococcal Polysaccharide-23 04/21/2011, 12/23/2020   Td 04/21/2011   Zoster, Live 12/13/2013    TDAP status: Up to date  Flu Vaccine status: Up to  date  Pneumococcal vaccine status: Up to date  Covid-19 vaccine status: Completed vaccines  Qualifies for Shingles Vaccine? Yes   Zostavax completed No   Shingrix Completed?: No.    Education has been provided regarding the importance of this vaccine. Patient has been advised to call insurance company to determine out of pocket expense if they have not yet received this vaccine. Advised may also receive vaccine at local pharmacy or Health Dept. Verbalized acceptance and understanding.  Screening Tests Health Maintenance  Topic Date Due   Zoster Vaccines- Shingrix (1 of 2) 07/23/1953   COVID-19 Vaccine (2 - Pfizer risk series) 01/13/2021   DTaP/Tdap/Td (2 - Tdap) 04/20/2021   INFLUENZA VACCINE  06/06/2023 (Originally 10/07/2022)   MAMMOGRAM  07/29/2023   Medicare Annual Wellness (AWV)  04/03/2024  Pneumonia Vaccine 71+ Years old  Completed   DEXA SCAN  Completed   HPV VACCINES  Aged Out    Health Maintenance  Health Maintenance Due  Topic Date Due   Zoster Vaccines- Shingrix (1 of 2) 07/23/1953   COVID-19 Vaccine (2 - Pfizer risk series) 01/13/2021   DTaP/Tdap/Td (2 - Tdap) 04/20/2021    Colorectal cancer screening: No longer required.   Mammogram status: No longer required due to age.  Lung Cancer Screening: (Low Dose CT Chest recommended if Age 49-80 years, 20 pack-year currently smoking OR have quit w/in 15years.) does not qualify.   Lung Cancer Screening Referral: no  Additional Screening:  Hepatitis C Screening: does not qualify; Completed no  Vision Screening: Recommended annual ophthalmology exams for early detection of glaucoma and other disorders of the eye. Is the patient up to date with their annual eye exam?  Yes  Who is the provider or what is the name of the office in which the patient attends annual eye exams? Dr Dion Body If pt is not established with a provider, would they like to be referred to a provider to establish care? No .   Dental Screening:  Recommended annual dental exams for proper oral hygiene  Diabetic Foot Exam: n/a  Community Resource Referral / Chronic Care Management: CRR required this visit?  No   CCM required this visit?  Appt scheduled with PCP    Plan:     I have personally reviewed and noted the following in the patient's chart:   Medical and social history Use of alcohol, tobacco or illicit drugs  Current medications and supplements including opioid prescriptions. Patient is not currently taking opioid prescriptions. Functional ability and status Nutritional status Physical activity Advanced directives List of other physicians Hospitalizations, surgeries, and ER visits in previous 12 months Vitals Screenings to include cognitive, depression, and falls Referrals and appointments  In addition, I have reviewed and discussed with patient certain preventive protocols, quality metrics, and best practice recommendations. A written personalized care plan for preventive services as well as general preventive health recommendations were provided to patient.   Sue Lush, LPN   06/14/8117   After Visit Summary: (MyChart) Due to this being a telephonic visit, the after visit summary with patients personalized plan was offered to patient via MyChart   Nurse Notes: The patient states she is doing well other than still has swollen and painful at times rt hand and arm (to elbow) She manages with prescribed medication. She has no concerns or questions at this time.

## 2023-04-04 NOTE — Patient Instructions (Signed)
Erika Evans , Thank you for taking time to come for your Medicare Wellness Visit. I appreciate your ongoing commitment to your health goals. Please review the following plan we discussed and let me know if I can assist you in the future.   Referrals/Orders/Follow-Ups/Clinician Recommendations: none  This is a list of the screening recommended for you and due dates:  Health Maintenance  Topic Date Due   Zoster (Shingles) Vaccine (1 of 2) 07/23/1953   COVID-19 Vaccine (2 - Pfizer risk series) 01/13/2021   DTaP/Tdap/Td vaccine (2 - Tdap) 04/20/2021   Flu Shot  06/06/2023*   Mammogram  07/29/2023   Medicare Annual Wellness Visit  04/03/2024   Pneumonia Vaccine  Completed   DEXA scan (bone density measurement)  Completed   HPV Vaccine  Aged Out  *Topic was postponed. The date shown is not the original due date.    Advanced directives: (In Chart) A copy of your advanced directives are scanned into your chart should your provider ever need it.  Next Medicare Annual Wellness Visit scheduled for next year: Yes 04/06/2024 @ 11:30am telephone

## 2023-07-08 ENCOUNTER — Encounter: Payer: Medicare Other | Admitting: Family Medicine

## 2023-07-11 ENCOUNTER — Encounter: Payer: Self-pay | Admitting: Family Medicine

## 2023-07-11 ENCOUNTER — Ambulatory Visit (INDEPENDENT_AMBULATORY_CARE_PROVIDER_SITE_OTHER): Payer: Medicare Other | Admitting: Family Medicine

## 2023-07-11 VITALS — BP 122/76 | HR 80 | Temp 98.2°F | Ht 58.75 in | Wt 139.2 lb

## 2023-07-11 DIAGNOSIS — K219 Gastro-esophageal reflux disease without esophagitis: Secondary | ICD-10-CM

## 2023-07-11 DIAGNOSIS — Z0000 Encounter for general adult medical examination without abnormal findings: Secondary | ICD-10-CM

## 2023-07-11 DIAGNOSIS — E7800 Pure hypercholesterolemia, unspecified: Secondary | ICD-10-CM | POA: Diagnosis not present

## 2023-07-11 DIAGNOSIS — I700 Atherosclerosis of aorta: Secondary | ICD-10-CM

## 2023-07-11 DIAGNOSIS — M8589 Other specified disorders of bone density and structure, multiple sites: Secondary | ICD-10-CM

## 2023-07-11 DIAGNOSIS — Z79899 Other long term (current) drug therapy: Secondary | ICD-10-CM

## 2023-07-11 NOTE — Assessment & Plan Note (Addendum)
 Reviewed health habits including diet and exercise and skin cancer prevention Reviewed appropriate screening tests for age  Also reviewed health mt list, fam hx and immunization status , as well as social and family history   See HPI Labs reviewed and ordered Health Maintenance  Topic Date Due   DTaP/Tdap/Td vaccine (2 - Tdap) 07/10/2024*   COVID-19 Vaccine (2 - Pfizer risk series) 07/26/2024*   Zoster (Shingles) Vaccine (1 of 2) 10/10/2024*   Mammogram  07/29/2023   Flu Shot  10/07/2023   Medicare Annual Wellness Visit  04/03/2024   Pneumonia Vaccine  Completed   DEXA scan (bone density measurement)  Completed   HPV Vaccine  Aged Out   Meningitis B Vaccine  Aged Out  *Topic was postponed. The date shown is not the original due date.   Urged to consider shingrix Also get tetanus vaccine at pharmacy  Plans to schedule mammogram  Discussed fall prevention, supplements and exercise for bone density  PHQ 8 , declines intervention as this does not make life difficult at all / thinks she has slowed down with age, also recent grief losing sister

## 2023-07-11 NOTE — Assessment & Plan Note (Signed)
 Disc goals for lipids and reasons to control them Rev last labs with pt Rev low sat fat diet in detail  Labs today  Has declined statin in pas

## 2023-07-11 NOTE — Assessment & Plan Note (Signed)
 Continues omeprazole  and seldom has breakthrough symptoms  20 mg daily   Watching vitamin levels and renal fxn

## 2023-07-11 NOTE — Assessment & Plan Note (Signed)
 Dexa 2018 Has kyphosis  No falls or fx Declines another dexa  Was intol to alendronate, miacalcin and also evista  She it no open to other medication   Disc imp of strength building exercise  Also vit D intake   Discussed fall prevention, supplements and exercise for bone density

## 2023-07-11 NOTE — Progress Notes (Signed)
 Subjective:    Patient ID: Erika Evans, female    DOB: 04/20/1934, 89 y.o.   MRN: [id number]  HPI  Here for health maintenance exam and to review chronic medical problems   Wt Readings from Last 3 Encounters:  07/11/23 139 lb 4 oz (63.2 kg)  04/04/23 137 lb (62.1 kg)  01/24/23 137 lb (62.1 kg)   28.36 kg/m  Vitals:   07/11/23 1400  BP: 122/76  Pulse: 80  Temp: 98.2 F (36.8 C)  SpO2: 98%    Immunization History  Administered Date(s) Administered   Fluad Quad(high Dose 65+) 12/12/2018, 01/07/2022   Influenza Split 12/10/2010, 12/03/2011   Influenza, High Dose Seasonal PF 12/13/2016   Influenza,inj,Quad PF,6+ Mos 11/27/2012, 12/11/2013, 01/01/2015, 12/12/2015, 12/14/2017, 12/12/2019   Influenza-Unspecified 03/12/2020   PFIZER(Purple Top)SARS-COV-2 Vaccination 12/23/2020   Pneumococcal Conjugate-13 01/28/2015   Pneumococcal Polysaccharide-23 04/21/2011, 12/23/2020   Td 04/21/2011   Zoster, Live 12/13/2013    There are no preventive care reminders to display for this patient.  Shingrix -may consider   Td 2013  Mammogram 07/2022 - she needs to schedule at breast center [facility]  Self breast exam-no lumps   Gyn health-no problems    Colon cancer screening -out aged   Bone health  Dexa 02/2017 osteopenia  In past intolerant of alendronate, miacalcin and evista and declined any more/new medication  Falls-none Fractures-none  Supplements : vitamin D  regularly  Last vitamin D  Lab Results  Component Value Date   VD25OH 53.54 07/05/2022    Exercise :  Works in the yard  Stays active  Nervous to walk  Has exercise bike  Has some weights and needs to use them   Mood    07/11/2023    2:07 PM 04/04/2023   10:21 AM 03/31/2022   11:28 AM 03/30/2021    9:05 AM 12/20/2018    2:13 PM  Depression screen PHQ 2/9  Decreased Interest 0 0 0 0 0  Down, Depressed, Hopeless 2 1 1 1  0  PHQ - 2 Score 2 1 1 1  0  Altered sleeping 1  1  0  Tired,  decreased energy 2  0  0  Change in appetite 1  0  0  Feeling bad or failure about yourself  0  0  0  Trouble concentrating 1  0  0  Moving slowly or fidgety/restless 1  0  0  Suicidal thoughts 0  0  0  PHQ-9 Score 8  2  0  Difficult doing work/chores Not difficult at all  Not difficult at all  Not difficult at all   Lost sister this week/ had a fall / had dementia/strokes    GERD  Omeprazole  20 mg daily  That works well   Lab Results  Component Value Date   VITAMINB12 1,255 (H) 07/05/2022   Last vitamin D  Lab Results  Component Value Date   VD25OH 53.54 07/05/2022    Hyperlipidemia Lab Results  Component Value Date   CHOL 214 (H) 07/05/2022   HDL 60.40 07/05/2022   LDLCALC 135 (H) 07/05/2022   LDLDIRECT 123.5 11/27/2012   TRIG 95.0 07/05/2022   CHOLHDL 4 07/05/2022    Sees allergist  Mucous stays thick     Patient Active Problem List   Diagnosis Date Noted   Fatigue 07/05/2022   Encounter for screening mammogram for breast cancer 07/05/2022   Dyspnea 05/12/2022   Current use of proton pump inhibitor 07/03/2021   Allergic rhinitis 07/03/2021  Erosive osteoarthritis of both hands 03/11/2021   Aortic atherosclerosis (HCC) 11/05/2020   Venous insufficiency of both lower extremities 12/21/2017   Routine general medical examination at a health care facility 01/28/2015   Estrogen deficiency 01/28/2015   Encounter for Medicare annual wellness exam 11/26/2012   GERD (gastroesophageal reflux disease) 04/21/2011   OA (osteoarthritis) 04/21/2011   Hyperlipidemia 04/21/2011   Osteopenia 04/21/2011   Past Medical History:  Diagnosis Date   Asthma    Cataract 2019   resolved with surgery   Congenital fusion of cervical spine    Elevated liver enzymes    Foot pain    Left; nerve problem; procedure   GERD (gastroesophageal reflux disease)    Hepatitis B carrier (HCC)    Positive hep B test at [facility]   Hyperlipidemia    Mild   Osteoarthritis    Osteopenia     Pneumonia 2004   Past Surgical History:  Procedure Laterality Date   "Blocked kidney"     BACK SURGERY     CARDIOVASCULAR STRESS TEST  9/07   Neg   CATARACT EXTRACTION W/ INTRAOCULAR LENS IMPLANT Right 05/06/2017   Dr. Demetrios Finders   CATARACT EXTRACTION W/ INTRAOCULAR LENS IMPLANT Left 05/20/2017   Dr. Demetrios Finders   COLONOSCOPY  9/06   Diverticulosis, hems   COLONOSCOPY  04/04/2019   Dexa     Osteopenis 9/01, improved 10/03, decreased BMD 5/06   PARTIAL HYSTERECTOMY     UPPER GASTROINTESTINAL ENDOSCOPY  04/04/2019   US  ECHOCARDIOGRAPHY  7/03   Abd-normal   US  RENAL/AORTA  2007   Neg   Social History   Tobacco Use   Smoking status: Never   Smokeless tobacco: Never  Vaping Use   Vaping status: Never Used  Substance Use Topics   Alcohol use: No    Alcohol/week: 0.0 standard drinks of alcohol   Drug use: No   Family History  Problem Relation Age of Onset   Lung cancer Father    Cancer Sister        Bladder   Dementia Sister    Stroke Brother    Leukemia Brother    Heart attack Brother    Colon cancer Neg Hx    Colon polyps Neg Hx    Esophageal cancer Neg Hx    Rectal cancer Neg Hx    Stomach cancer Neg Hx    Allergies  Allergen Reactions   Alendronate Sodium     REACTION: acid reflux   Iodinated Contrast Media Other (See Comments)   Miacalcin [Calcitonin ([place])] Other (See Comments)    Pt thinks the spray gave her a staph infection in her nose    Pepcid  [Famotidine ] Diarrhea   Raloxifene     REACTION: severe muscle pain and weakness   Current Outpatient Medications on File Prior to Visit  Medication Sig Dispense Refill   acetaminophen  (TYLENOL ) 325 MG tablet Take 2 tablets (650 mg total) by mouth every 4 (four) hours as needed for mild pain or moderate pain (or Fever >/= 101).     albuterol  (PROVENTIL  HFA;VENTOLIN  HFA) 108 (90 BASE) MCG/ACT inhaler Inhale 2 puffs into the lungs every 4 (four) hours as needed for wheezing or shortness of breath. 1 Inhaler 3    aspirin 81 MG tablet Take 81 mg by mouth daily.       Bepotastine Besilate 1.5 % SOLN Place 1 drop into both eyes as needed.     Cholecalciferol (VITAMIN D -3) 1000 UNITS CAPS Take 2 capsules  by mouth daily.     fish oil-omega-3 fatty acids 1000 MG capsule Take 1 g by mouth 2 (two) times daily.      Fluticasone-Salmeterol (ADVAIR) 250-50 MCG/DOSE AEPB Inhale 1 puff into the lungs every 12 (twelve) hours.      guaiFENesin  (MUCINEX  PO) Take 1 capsule by mouth daily.     levocetirizine (XYZAL) 5 MG tablet Take 5 mg by mouth at bedtime.     meloxicam  (MOBIC ) 15 MG tablet Take 1 tablet by mouth daily.     montelukast (SINGULAIR) 10 MG tablet Take 10 mg by mouth daily.       omeprazole  (PRILOSEC) 20 MG capsule Take 1 capsule (20 mg total) by mouth daily. 90 capsule 1   No current facility-administered medications on file prior to visit.    Review of Systems  Constitutional:  Negative for activity change, appetite change, fatigue, fever and unexpected weight change.  HENT:  Negative for congestion, ear pain, rhinorrhea, sinus pressure and sore throat.   Eyes:  Negative for pain, redness and visual disturbance.  Respiratory:  Negative for cough, shortness of breath and wheezing.   Cardiovascular:  Negative for chest pain and palpitations.  Gastrointestinal:  Negative for abdominal pain, blood in stool, constipation and diarrhea.  Endocrine: Negative for polydipsia and polyuria.  Genitourinary:  Negative for dysuria, frequency and urgency.  Musculoskeletal:  Positive for arthralgias. Negative for back pain and myalgias.  Skin:  Negative for pallor and rash.  Allergic/Immunologic: Negative for environmental allergies.  Neurological:  Negative for dizziness, syncope and headaches.  Hematological:  Negative for adenopathy. Does not bruise/bleed easily.  Psychiatric/Behavioral:  Negative for decreased concentration and dysphoric mood. The patient is not nervous/anxious.        Grief         Objective:   Physical Exam Constitutional:      General: She is not in acute distress.    Appearance: Normal appearance. She is well-developed and normal weight. She is not ill-appearing or diaphoretic.  HENT:     Head: Normocephalic and atraumatic.     Right Ear: Tympanic membrane, ear canal and external ear normal.     Left Ear: Tympanic membrane, ear canal and external ear normal.     Nose: Nose normal. No congestion.     Mouth/Throat:     Mouth: Mucous membranes are moist.     Pharynx: Oropharynx is clear. No posterior oropharyngeal erythema.  Eyes:     General: No scleral icterus.    Extraocular Movements: Extraocular movements intact.     Conjunctiva/sclera: Conjunctivae normal.     Pupils: Pupils are equal, round, and reactive to light.  Neck:     Thyroid : No thyromegaly.     Vascular: No carotid bruit or JVD.  Cardiovascular:     Rate and Rhythm: Normal rate and regular rhythm.     Pulses: Normal pulses.     Heart sounds: Normal heart sounds.     No gallop.  Pulmonary:     Effort: Pulmonary effort is normal. No respiratory distress.     Breath sounds: Normal breath sounds. No wheezing.     Comments: Good air exch Chest:     Chest wall: No tenderness.  Abdominal:     General: Bowel sounds are normal. There is no distension or abdominal bruit.     Palpations: Abdomen is soft. There is no mass.     Tenderness: There is no abdominal tenderness.     Hernia: No hernia  is present.  Genitourinary:    Comments: Breast exam: No mass, nodules, thickening, tenderness, bulging, retraction, inflamation, nipple discharge or skin changes noted.  No axillary or clavicular LA.     Musculoskeletal:        General: No tenderness. Normal range of motion.     Cervical back: Normal range of motion and neck supple. No rigidity. No muscular tenderness.     Right lower leg: No edema.     Left lower leg: No edema.     Comments: No kyphosis   Lymphadenopathy:     Cervical: No cervical  adenopathy.  Skin:    General: Skin is warm and dry.     Coloration: Skin is not pale.     Findings: No erythema or rash.     Comments: Solar lentigines diffusely Scattered sks   Neurological:     Mental Status: She is alert. Mental status is at baseline.     Cranial Nerves: No cranial nerve deficit.     Motor: No abnormal muscle tone.     Coordination: Coordination normal.     Gait: Gait normal.     Deep Tendon Reflexes: Reflexes are normal and symmetric. Reflexes normal.  Psychiatric:        Mood and Affect: Mood normal.        Cognition and Memory: Cognition and memory normal.           Assessment & Plan:   Problem List Items Addressed This Visit       Cardiovascular and Mediastinum   Aortic atherosclerosis (HCC)   Pt declines statin for cholesterol Bp is fine   No symptoms         Digestive   GERD (gastroesophageal reflux disease)   Continues omeprazole  and seldom has breakthrough symptoms  20 mg daily   Watching vitamin levels and renal fxn       Relevant Orders   CBC with Differential/Platelet     Musculoskeletal and Integument   Osteopenia   Dexa 2018 Has kyphosis  No falls or fx Declines another dexa  Was intol to alendronate, miacalcin and also evista  She it no open to other medication   Disc imp of strength building exercise  Also vit D intake   Discussed fall prevention, supplements and exercise for bone density          Other   Routine general medical examination at a health care facility - Primary   Reviewed health habits including diet and exercise and skin cancer prevention Reviewed appropriate screening tests for age  Also reviewed health mt list, fam hx and immunization status , as well as social and family history   See HPI Labs reviewed and ordered Health Maintenance  Topic Date Due   DTaP/Tdap/Td vaccine (2 - Tdap) 07/10/2024*   COVID-19 Vaccine (2 - Pfizer risk series) 07/26/2024*   Zoster (Shingles) Vaccine (1 of 2)  10/10/2024*   Mammogram  07/29/2023   Flu Shot  10/07/2023   Medicare Annual Wellness Visit  04/03/2024   Pneumonia Vaccine  Completed   DEXA scan (bone density measurement)  Completed   HPV Vaccine  Aged Out   Meningitis B Vaccine  Aged Out  *Topic was postponed. The date shown is not the original due date.   Urged to consider shingrix Also get tetanus vaccine at pharmacy  Plans to schedule mammogram  Discussed fall prevention, supplements and exercise for bone density  PHQ 8 , declines intervention as this does not  make life difficult at all / thinks she has slowed down with age, also recent grief losing sister         Hyperlipidemia   Disc goals for lipids and reasons to control them Rev last labs with pt Rev low sat fat diet in detail  Labs today  Has declined statin in pas       Relevant Orders   Comprehensive metabolic panel with GFR   Lipid Panel   TSH   Current use of proton pump inhibitor   Lab Results  Component Value Date   VITAMINB12 1,255 (H) 07/05/2022   Last vitamin D  Lab Results  Component Value Date   VD25OH 53.54 07/05/2022   Lab today      Relevant Orders   Vitamin B12

## 2023-07-11 NOTE — Assessment & Plan Note (Signed)
 Lab Results  Component Value Date   VITAMINB12 1,255 (H) 07/05/2022   Last vitamin D  Lab Results  Component Value Date   VD25OH 53.54 07/05/2022   Lab today

## 2023-07-11 NOTE — Assessment & Plan Note (Signed)
Pt declines statin for cholesterol Bp is fine   No symptoms

## 2023-07-11 NOTE — Patient Instructions (Addendum)
 If you are interested in the new shingles vaccine (Shingrix) - call your local pharmacy to check on coverage and availability   You can also ask about a tetanus shot at the pharmacy    For exercise - try your exercise bike  Do what you can and does not hurt  Add some strength training to your routine, this is important for bone and brain health and can reduce your risk of falls and help your body use insulin properly and regulate weight  Light weights, exercise bands , and internet videos are a good way to start  Yoga (chair or regular), machines , floor exercises or a gym with machines are also good options    Labs today   Take care of yourself

## 2023-07-12 ENCOUNTER — Encounter: Payer: Self-pay | Admitting: Family Medicine

## 2023-07-12 LAB — CBC WITH DIFFERENTIAL/PLATELET
Basophils Absolute: 0.1 10*3/uL (ref 0.0–0.1)
Basophils Relative: 1.4 % (ref 0.0–3.0)
Eosinophils Absolute: 0.1 10*3/uL (ref 0.0–0.7)
Eosinophils Relative: 1.3 % (ref 0.0–5.0)
HCT: 39.7 % (ref 36.0–46.0)
Hemoglobin: 13.1 g/dL (ref 12.0–15.0)
Lymphocytes Relative: 34.7 % (ref 12.0–46.0)
Lymphs Abs: 2.8 10*3/uL (ref 0.7–4.0)
MCHC: 33 g/dL (ref 30.0–36.0)
MCV: 92.7 fl (ref 78.0–100.0)
Monocytes Absolute: 0.8 10*3/uL (ref 0.1–1.0)
Monocytes Relative: 10.3 % (ref 3.0–12.0)
Neutro Abs: 4.2 10*3/uL (ref 1.4–7.7)
Neutrophils Relative %: 52.3 % (ref 43.0–77.0)
Platelets: 204 10*3/uL (ref 150.0–400.0)
RBC: 4.28 Mil/uL (ref 3.87–5.11)
RDW: 13.4 % (ref 11.5–15.5)
WBC: 8.1 10*3/uL (ref 4.0–10.5)

## 2023-07-12 LAB — COMPREHENSIVE METABOLIC PANEL WITH GFR
ALT: 11 U/L (ref 0–35)
AST: 16 U/L (ref 0–37)
Albumin: 4.1 g/dL (ref 3.5–5.2)
Alkaline Phosphatase: 74 U/L (ref 39–117)
BUN: 17 mg/dL (ref 6–23)
CO2: 32 meq/L (ref 19–32)
Calcium: 9.7 mg/dL (ref 8.4–10.5)
Chloride: 102 meq/L (ref 96–112)
Creatinine, Ser: 0.71 mg/dL (ref 0.40–1.20)
GFR: 76.69 mL/min (ref >60.00)
Glucose, Bld: 88 mg/dL (ref 70–99)
Potassium: 4.2 meq/L (ref 3.5–5.1)
Sodium: 142 meq/L (ref 135–145)
Total Bilirubin: 0.7 mg/dL (ref 0.2–1.2)
Total Protein: 6.8 g/dL (ref 6.0–8.3)

## 2023-07-12 LAB — LIPID PANEL
Cholesterol: 205 mg/dL — ABNORMAL HIGH (ref 0–200)
HDL: 62.9 mg/dL (ref >39.00)
LDL Cholesterol: 130 mg/dL — ABNORMAL HIGH (ref 0–99)
NonHDL: 142.25
Total CHOL/HDL Ratio: 3
Triglycerides: 63 mg/dL (ref 0.0–149.0)
VLDL: 12.6 mg/dL (ref 0.0–40.0)

## 2023-07-12 LAB — TSH: TSH: 1.86 u[IU]/mL (ref 0.35–5.50)

## 2023-07-12 LAB — VITAMIN B12: Vitamin B-12: 533 pg/mL (ref 211–911)

## 2023-08-17 ENCOUNTER — Telehealth: Payer: Self-pay | Admitting: Family Medicine

## 2023-08-17 NOTE — Telephone Encounter (Signed)
 Please schedule an appt with 1st available provider, pt hasn't had foot eval for issue and if need be we can do xray's here if provider this appropriate

## 2023-08-17 NOTE — Telephone Encounter (Signed)
 Copied from CRM [id number]. Topic: General - Other >> Aug 17, 2023 11:26 AM Jenice Mitts wrote: Reason for CRM: Patient is calling in because she is still having discomfort and on and off again pain with her foot that she injured. She doesn't wanna go out of office to an imaging facility  She is wondering if she can get an order for for an xray or Ultrasound of her foot

## 2023-08-18 NOTE — Telephone Encounter (Signed)
 Called  pt and schedule a appt foot pain with [facility]

## 2023-08-18 NOTE — Telephone Encounter (Signed)
 Noted

## 2023-08-19 ENCOUNTER — Encounter: Payer: Self-pay | Admitting: Family Medicine

## 2023-08-19 ENCOUNTER — Ambulatory Visit: Admitting: Family Medicine

## 2023-08-19 ENCOUNTER — Ambulatory Visit (INDEPENDENT_AMBULATORY_CARE_PROVIDER_SITE_OTHER)
Admission: RE | Admit: 2023-08-19 | Discharge: 2023-08-19 | Disposition: A | Discharge status: Home / Self Care | Source: Ambulatory Visit | Attending: Family Medicine | Admitting: Family Medicine

## 2023-08-19 ENCOUNTER — Ambulatory Visit: Payer: Self-pay | Admitting: Family Medicine

## 2023-08-19 ENCOUNTER — Ambulatory Visit: Payer: Self-pay

## 2023-08-19 VITALS — BP 130/74 | HR 110 | Temp 98.0°F | Ht 58.75 in | Wt 138.1 lb

## 2023-08-19 DIAGNOSIS — J4541 Moderate persistent asthma with (acute) exacerbation: Secondary | ICD-10-CM

## 2023-08-19 DIAGNOSIS — M79671 Pain in right foot: Secondary | ICD-10-CM

## 2023-08-19 DIAGNOSIS — R0602 Shortness of breath: Secondary | ICD-10-CM

## 2023-08-19 DIAGNOSIS — R051 Acute cough: Secondary | ICD-10-CM

## 2023-08-19 LAB — BASIC METABOLIC PANEL WITH GFR
BUN: 15 mg/dL (ref 7–25)
CO2: 33 mmol/L — ABNORMAL HIGH (ref 20–32)
Calcium: 9.4 mg/dL (ref 8.6–10.4)
Chloride: 102 mmol/L (ref 98–110)
Creat: 0.74 mg/dL (ref 0.60–0.95)
Glucose, Bld: 103 mg/dL — ABNORMAL HIGH (ref 65–99)
Potassium: 3.9 mmol/L (ref 3.5–5.3)
Sodium: 142 mmol/L (ref 135–146)
eGFR: 78 mL/min/{1.73_m2} (ref >=60)

## 2023-08-19 LAB — CBC WITH DIFFERENTIAL/PLATELET
Absolute Lymphocytes: 2214 {cells}/uL (ref 850–3900)
Absolute Monocytes: 1246 {cells}/uL — ABNORMAL HIGH (ref 200–950)
Basophils Absolute: 24 {cells}/uL (ref 0–200)
Basophils Relative: 0.2 %
Eosinophils Absolute: 85 {cells}/uL (ref 15–500)
Eosinophils Relative: 0.7 %
HCT: 38.7 % (ref 35.0–45.0)
Hemoglobin: 12.6 g/dL (ref 11.7–15.5)
MCH: 30.4 pg (ref 27.0–33.0)
MCHC: 32.6 g/dL (ref 32.0–36.0)
MCV: 93.3 fL (ref 80.0–100.0)
MPV: 11.1 fL (ref 7.5–12.5)
Monocytes Relative: 10.3 %
Neutro Abs: 8531 {cells}/uL — ABNORMAL HIGH (ref 1500–7800)
Neutrophils Relative %: 70.5 %
Platelets: 216 10*3/uL (ref 140–400)
RBC: 4.15 10*6/uL (ref 3.80–5.10)
RDW: 12.4 % (ref 11.0–15.0)
Total Lymphocyte: 18.3 %
WBC: 12.1 10*3/uL — ABNORMAL HIGH (ref 3.8–10.8)

## 2023-08-19 LAB — POC COVID19 BINAXNOW: SARS Coronavirus 2 Ag: NEGATIVE

## 2023-08-19 MED ORDER — DOXYCYCLINE HYCLATE 100 MG PO TABS: 100.0000 mg | ORAL_TABLET | Freq: Two times a day (BID) | ORAL | 0 refills | Status: AC

## 2023-08-19 MED ORDER — ALBUTEROL SULFATE (2.5 MG/3ML) 0.083% IN NEBU
2.5000 mg | INHALATION_SOLUTION | Freq: Once | RESPIRATORY_TRACT | Status: AC
  Administered 2023-08-19: 2.5 mg via RESPIRATORY_TRACT

## 2023-08-19 MED ORDER — PREDNISONE 20 MG PO TABS: ORAL_TABLET | ORAL | 0 refills | Status: DC

## 2023-08-19 MED ORDER — ALBUTEROL SULFATE HFA 108 (90 BASE) MCG/ACT IN AERS: 2.0000 | INHALATION_SPRAY | RESPIRATORY_TRACT | 3 refills | Status: AC | PRN

## 2023-08-19 MED ORDER — AMOXICILLIN-POT CLAVULANATE 875-125 MG PO TABS: 1.0000 | ORAL_TABLET | Freq: Two times a day (BID) | ORAL | 0 refills | Status: DC

## 2023-08-19 NOTE — Telephone Encounter (Signed)
 FYI Only or Action Required?: FYI only for provider  Patient was last seen in primary care on 08/19/2023 by Judithann Novas, MD. Called Nurse Triage reporting Advice Only. Symptoms began today. Interventions attempted: Other: n/a. Symptoms are: n/a.  Triage Disposition: Call with any other questions  Patient/caregiver understands and will follow disposition?: Yes    Copied from CRM [id number]. Topic: Clinical - Lab/Test Results >> Aug 19, 2023  5:17 PM Magdalene School wrote: Reason for CRM: Patient returning call for lab results. Reason for Disposition  [1] Caller requesting NON-URGENT health information AND [2] PCP's office is the best resource  Answer Assessment - Initial Assessment Questions 1. REASON FOR CALL or QUESTION: What is your reason for calling today? or How can I best help you? or What question do you have that I can help answer?     Patient called about questions about medications that were prescribed today and she did not know if she needed to take all of them.  Only abuterol MDI and prednisone  were picked up at pharmacy.  Directed patient to pick up and take antibiotics as well.  She verbalized understanding. She also states that she will monitor mychart for her XR results  Protocols used: Information Only Call - No Triage-A-AH

## 2023-08-19 NOTE — Assessment & Plan Note (Signed)
 Acute, plain films showed no evidence of bony fracture. The area of redness and soreness on the dorsal foot concerning for cellulitis.  Will treat with doxycycline  100 mg p.o. twice daily x 10 days.  Return and ER precautions provided

## 2023-08-19 NOTE — Progress Notes (Signed)
 Patient ID: Erika Evans, female    DOB: [birth year], 89 y.o.   MRN: [id number]  This visit was conducted in person.  BP 130/74   Pulse (!) 110   Temp 98 F (36.7 C) (Temporal)   Ht 4' 10.75 (1.492 m)   Wt 138 lb 2 oz (62.7 kg)   SpO2 94%   BMI 28.14 kg/m    CC:  Chief Complaint  Patient presents with   Foot Pain    Right-Can on food rolled off pantry shelf and landed on her food early May   Asthma    Subjective:   HPI: Erika Evans is a 89 y.o. female presenting on 08/19/2023 for Foot Pain (Right-Can on food rolled off pantry shelf and landed on her food early May) and Asthma   Right foot pain, started after a canned food rolled off pantry shelf and landed on her foot approximately 1 month ago. Git small cut on top of foot.  Small yellow discharge. Pain with weight bearing.  Skin is very sore on top of foot.  No redness spreading. No fever.  She has had 3 days of symptoms, runny nose, no ST, no ear pain, headache.  No fever.  Productive cough, yellow.  Increased shortness of breath. Cough keeping her up at night  Chills last night   She also woke up this AM with asthma flare  She has treated with albuterol  inhaler  and nebs prn.. used it this AM.  She take Advair, Singulair and Xyzal for  symptoms     Relevant past medical, surgical, family and social history reviewed and updated as indicated. Interim medical history since our last visit reviewed. Allergies and medications reviewed and updated. Outpatient Medications Prior to Visit  Medication Sig Dispense Refill   acetaminophen  (TYLENOL ) 325 MG tablet Take 2 tablets (650 mg total) by mouth every 4 (four) hours as needed for mild pain or moderate pain (or Fever >/= 101).     aspirin 81 MG tablet Take 81 mg by mouth daily.       Bepotastine Besilate 1.5 % SOLN Place 1 drop into both eyes as needed.     Cholecalciferol (VITAMIN D -3) 1000 UNITS CAPS Take 2 capsules by mouth daily.     fish oil-omega-3  fatty acids 1000 MG capsule Take 1 g by mouth 2 (two) times daily.      Fluticasone-Salmeterol (ADVAIR) 250-50 MCG/DOSE AEPB Inhale 1 puff into the lungs every 12 (twelve) hours.      guaiFENesin  (MUCINEX  PO) Take 1 capsule by mouth daily.     levocetirizine (XYZAL) 5 MG tablet Take 5 mg by mouth at bedtime.     meloxicam  (MOBIC ) 15 MG tablet Take 1 tablet by mouth daily.     montelukast (SINGULAIR) 10 MG tablet Take 10 mg by mouth daily.       omeprazole  (PRILOSEC) 20 MG capsule Take 1 capsule (20 mg total) by mouth daily. 90 capsule 1   albuterol  (PROVENTIL  HFA;VENTOLIN  HFA) 108 (90 BASE) MCG/ACT inhaler Inhale 2 puffs into the lungs every 4 (four) hours as needed for wheezing or shortness of breath. 1 Inhaler 3   No facility-administered medications prior to visit.     Per HPI unless specifically indicated in ROS section below Review of Systems  Constitutional:  Negative for fatigue and fever.  HENT:  Positive for congestion.   Eyes:  Negative for pain.  Respiratory:  Positive for cough, shortness of breath and wheezing.  Cardiovascular:  Negative for chest pain, palpitations and leg swelling.  Gastrointestinal:  Negative for abdominal pain.  Genitourinary:  Negative for dysuria and vaginal bleeding.  Musculoskeletal:  Negative for back pain.  Neurological:  Positive for headaches. Negative for syncope and light-headedness.  Psychiatric/Behavioral:  Negative for dysphoric mood.    Objective:  BP 130/74   Pulse (!) 110   Temp 98 F (36.7 C) (Temporal)   Ht 4' 10.75 (1.492 m)   Wt 138 lb 2 oz (62.7 kg)   SpO2 94%   BMI 28.14 kg/m   Wt Readings from Last 3 Encounters:  08/19/23 138 lb 2 oz (62.7 kg)  07/11/23 139 lb 4 oz (63.2 kg)  04/04/23 137 lb (62.1 kg)      Physical Exam Constitutional:      General: She is not in acute distress.    Appearance: Normal appearance. She is well-developed. She is not ill-appearing or toxic-appearing.  HENT:     Head: Normocephalic.      Right Ear: Hearing, tympanic membrane, ear canal and external ear normal. Tympanic membrane is not erythematous, retracted or bulging.     Left Ear: Hearing, tympanic membrane, ear canal and external ear normal. Tympanic membrane is not erythematous, retracted or bulging.     Nose: No mucosal edema or rhinorrhea.     Right Sinus: No maxillary sinus tenderness or frontal sinus tenderness.     Left Sinus: No maxillary sinus tenderness or frontal sinus tenderness.     Mouth/Throat:     Pharynx: Uvula midline.   Eyes:     General: Lids are normal. Lids are everted, no foreign bodies appreciated.     Conjunctiva/sclera: Conjunctivae normal.     Pupils: Pupils are equal, round, and reactive to light.   Neck:     Thyroid : No thyroid  mass or thyromegaly.     Vascular: No carotid bruit.     Trachea: Trachea normal.   Cardiovascular:     Rate and Rhythm: Normal rate and regular rhythm.     Pulses: Normal pulses.     Heart sounds: Normal heart sounds, S1 normal and S2 normal. No murmur heard.    No friction rub. No gallop.  Pulmonary:     Effort: Pulmonary effort is normal. No tachypnea or respiratory distress.     Breath sounds: Wheezing and rhonchi present. No decreased breath sounds or rales.  Abdominal:     General: Bowel sounds are normal.     Palpations: Abdomen is soft.     Tenderness: There is no abdominal tenderness.   Musculoskeletal:     Cervical back: Normal range of motion and neck supple.   Skin:    General: Skin is warm and dry.     Findings: No rash.   Neurological:     Mental Status: She is alert.   Psychiatric:        Mood and Affect: Mood is not anxious or depressed.        Speech: Speech normal.        Behavior: Behavior normal. Behavior is cooperative.        Thought Content: Thought content normal.        Judgment: Judgment normal.       Results for orders placed or performed in visit on 08/19/23  CBC with Differential/Platelet   Collection Time:  08/19/23 12:10 PM  Result Value Ref Range   WBC 12.1 (H) 3.8 - 10.8 Thousand/uL   RBC 4.15 3.80 - 5.10  Million/uL   Hemoglobin 12.6 11.7 - 15.5 g/dL   HCT 16.1 09.6 - 04.5 %   MCV 93.3 80.0 - 100.0 fL   MCH 30.4 27.0 - 33.0 pg   MCHC 32.6 32.0 - 36.0 g/dL   RDW 40.9 81.1 - 91.4 %   Platelets 216 140 - 400 Thousand/uL   MPV 11.1 7.5 - 12.5 fL   Neutro Abs 8,531 (H) 1,500 - 7,800 cells/uL   Absolute Lymphocytes 2,214 850 - 3,900 cells/uL   Absolute Monocytes 1,246 (H) 200 - 950 cells/uL   Eosinophils Absolute 85 15 - 500 cells/uL   Basophils Absolute 24 0 - 200 cells/uL   Neutrophils Relative % 70.5 %   Total Lymphocyte 18.3 %   Monocytes Relative 10.3 %   Eosinophils Relative 0.7 %   Basophils Relative 0.2 %  Basic Metabolic Panel   Collection Time: 08/19/23 12:10 PM  Result Value Ref Range   Glucose, Bld 103 (H) 65 - 99 mg/dL   BUN 15 7 - 25 mg/dL   Creat 7.82 9.56 - 2.13 mg/dL   eGFR 78 > OR = 60 YQ/MVH/8.46N6   BUN/Creatinine Ratio SEE NOTE: 6 - 22 (calc)   Sodium 142 135 - 146 mmol/L   Potassium 3.9 3.5 - 5.3 mmol/L   Chloride 102 98 - 110 mmol/L   CO2 33 (H) 20 - 32 mmol/L   Calcium 9.4 8.6 - 10.4 mg/dL  POC [id number] BinaxNow   Collection Time: 08/19/23 12:22 PM  Result Value Ref Range   SARS Coronavirus 2 Ag Negative Negative    Assessment and Plan  Right foot pain Assessment & Plan: Acute, plain films showed no evidence of bony fracture. The area of redness and soreness on the dorsal foot concerning for cellulitis.  Will treat with doxycycline  100 mg p.o. twice daily x 10 days.  Return and ER precautions provided  Orders: -     DG Foot Complete Right  Moderate persistent asthma with exacerbation Assessment & Plan: Acute, concern for bacterial pneumonia and acute asthma exacerbation. Albuterol  neb 2.5 in the office improved symptoms slightly. Chest x-ray remains pending.  Will check white blood cell count and electrolytes, kidney function for  complications. Patient does not meet sepsis criteria.  She is not hypoxic.  She does have elevated heart rate, but this improved after resting Will start doxycycline  100 mg p.o. twice daily. Prednisone  taper. Refilled albuterol  inhaler for her to use 2 puffs every 4-6 hours as needed. Continue Advair as directed.  Return and ER precautions provided  Orders: -     Albuterol  Sulfate  Acute cough -     DG Chest 2 View -     POC COVID-19 BinaxNow -     Albuterol  Sulfate  SOB (shortness of breath) -     CBC with Differential/Platelet -     Basic metabolic panel with GFR -     DG Chest 2 View -     POC COVID-19 BinaxNow -     Albuterol  Sulfate  Other orders -     predniSONE ; 3 tabs by mouth daily x 3 days, then 2 tabs by mouth daily x 2 days then 1 tab by mouth daily x 2 days  Dispense: 15 tablet; Refill: 0 -     Doxycycline  Hyclate; Take 1 tablet (100 mg total) by mouth 2 (two) times daily.  Dispense: 20 tablet; Refill: 0 -     Albuterol  Sulfate HFA; Inhale 2 puffs into the lungs every  4 (four) hours as needed for wheezing or shortness of breath.  Dispense: 1 each; Refill: 3    No follow-ups on file.   Herby Lolling, MD

## 2023-08-19 NOTE — Patient Instructions (Addendum)
 Complete prednisone  taper.  Complete 10 days of doxycyline.  Use albuterol  as needed and  continue ADviar inhaler twice daily.  Push, fluids , rest.  We will call with X-ray and lab results.  Call if redness spreading or fever on antibiotics.  Go to ER if severe shortness of breath.

## 2023-08-19 NOTE — Assessment & Plan Note (Addendum)
 Acute, concern for bacterial pneumonia and acute asthma exacerbation. Albuterol  neb 2.5 in the office improved symptoms slightly. Chest x-ray remains pending.  Will check white blood cell count and electrolytes, kidney function for complications. Patient does not meet sepsis criteria.  She is not hypoxic.  She does have elevated heart rate, but this improved after resting Will start doxycycline  100 mg p.o. twice daily. Prednisone  taper. Refilled albuterol  inhaler for her to use 2 puffs every 4-6 hours as needed. Continue Advair as directed.  Return and ER precautions provided

## 2023-08-26 ENCOUNTER — Encounter: Payer: Self-pay | Admitting: Family Medicine

## 2023-08-26 ENCOUNTER — Ambulatory Visit: Admitting: Family Medicine

## 2023-08-26 VITALS — BP 128/74 | HR 100 | Temp 98.2°F | Ht 58.75 in | Wt 140.4 lb

## 2023-08-26 DIAGNOSIS — M79671 Pain in right foot: Secondary | ICD-10-CM | POA: Diagnosis not present

## 2023-08-26 DIAGNOSIS — J189 Pneumonia, unspecified organism: Secondary | ICD-10-CM | POA: Diagnosis not present

## 2023-08-26 DIAGNOSIS — J4541 Moderate persistent asthma with (acute) exacerbation: Secondary | ICD-10-CM | POA: Diagnosis not present

## 2023-08-26 NOTE — Assessment & Plan Note (Signed)
 Reviewed note from Dr Cherlyn Cornet on 6/13 and xray report  Injured without fracture  Abrasion appears to be healing today   Encouraged to keep clean with soap and water and elevate when able

## 2023-08-26 NOTE — Assessment & Plan Note (Signed)
 Much improved with prednisone /albuterol  and treatment of CAP

## 2023-08-26 NOTE — Patient Instructions (Signed)
 Take care of yourself Let us  know if symptoms worsen  Drink fluids   Finish your antibiotics   Follow up 4-6 weeks

## 2023-08-26 NOTE — Assessment & Plan Note (Signed)
 Seen by Dr Cherlyn Cornet with uri symptoms and wheezing Notes/ labs/cxr and plan reviewed Clinical improvement today  Reassuring exam/ wheezing is improved  Encouraged to finish the doxycycline  and augmentin and prednisone    Call back and Er precautions noted in detail today    Follow up 4-6 wk for re check and cxr  Is up to date for pna vaccine

## 2023-08-26 NOTE — Progress Notes (Signed)
 Subjective:    Patient ID: Erika Evans, female    DOB: Jan 06, 1935, 89 y.o.   MRN: [id number]  HPI  Wt Readings from Last 3 Encounters:  08/26/23 140 lb 6 oz (63.7 kg)  08/19/23 138 lb 2 oz (62.7 kg)  07/11/23 139 lb 4 oz (63.2 kg)   28.59 kg/m  Vitals:   08/26/23 1347  BP: 128/74  Pulse: 100  Temp: 98.2 F (36.8 C)  SpO2: 93%     Pt present for follow up of pneumonia and trauma to foot  Saw Dr Cherlyn Cornet on 6/13  DG Chest 2 View Result Date: 08/19/2023 CLINICAL DATA:  Cough and dyspnea EXAM: CHEST - 2 VIEW COMPARISON:  Chest radiograph dated 05/12/2022 FINDINGS: Patient is rotated to the right. Normal lung volumes. Similar bilateral lower lung patchy and linear opacities. Trace blunting of the right costophrenic angle. No pneumothorax. Similar cardiomediastinal silhouette. No acute osseous abnormality. IMPRESSION: 1. Similar bilateral lower lung patchy and linear opacities, which may represent atelectasis, aspiration, or pneumonia. 2. Trace blunting of the right costophrenic angle, which may represent a trace pleural effusion. Electronically Signed   By: Limin  Xu M.D.   On: 08/19/2023 13:59   DG Foot Complete Right Result Date: 08/19/2023 CLINICAL DATA:  dropped can on foot, very tender central dorsal foot. EXAM: RIGHT FOOT COMPLETE - 3+ VIEW COMPARISON:  01/13/2017. FINDINGS: There is diffuse osteopenia of the visualized osseous structures. No acute fracture or dislocation. No aggressive osseous lesion. Diffuse mild-to-moderate degenerative changes of imaged joints. Calcaneal spur noted along the Achilles tendon and Plantar aponeurosis attachment sites. No focal soft tissue swelling. No radiopaque foreign bodies. IMPRESSION: *No acute osseous abnormality of the right foot. Electronically Signed   By: Beula Brunswick M.D.   On: 08/19/2023 12:36     Treated with doxycycline  and augmentin  Also prednisone  taper and albuterol  nmt/mdi   Lab Results  Component Value Date   WBC  12.1 (H) 08/19/2023   HGB 12.6 08/19/2023   HCT 38.7 08/19/2023   MCV 93.3 08/19/2023   PLT 216 08/19/2023   Lab Results  Component Value Date   NA 142 08/19/2023   K 3.9 08/19/2023   CO2 33 (H) 08/19/2023   GLUCOSE 103 (H) 08/19/2023   BUN 15 08/19/2023   CREATININE 0.74 08/19/2023   CALCIUM 9.4 08/19/2023   GFR 76.69 07/11/2023   EGFR 78 08/19/2023   GFRNONAA >60 10/25/2020    Feels better overall  Still coughing up some phlegm   Foot is healing slowly   No fever  Breathing is improved   Just recently calmed down after the prednisone  / made her anxious      Patient Active Problem List   Diagnosis Date Noted   Right foot pain 08/19/2023   Moderate persistent asthma with exacerbation 08/19/2023   Fatigue 07/05/2022   Encounter for screening mammogram for breast cancer 07/05/2022   SOB (shortness of breath) 05/12/2022   Current use of proton pump inhibitor 07/03/2021   Allergic rhinitis 07/03/2021   Erosive osteoarthritis of both hands 03/11/2021   Aortic atherosclerosis (HCC) 11/05/2020   CAP (community acquired pneumonia) 10/25/2020   Venous insufficiency of both lower extremities 12/21/2017   Routine general medical examination at a health care facility 01/28/2015   Estrogen deficiency 01/28/2015   Encounter for Medicare annual wellness exam 11/26/2012   GERD (gastroesophageal reflux disease) 04/21/2011   OA (osteoarthritis) 04/21/2011   Hyperlipidemia 04/21/2011   Osteopenia 04/21/2011   Past Medical  History:  Diagnosis Date   Asthma    Cataract 2019   resolved with surgery   Congenital fusion of cervical spine    Elevated liver enzymes    Foot pain    Left; nerve problem; procedure   GERD (gastroesophageal reflux disease)    Hepatitis B carrier (HCC)    Positive hep B test at [facility]   Hyperlipidemia    Mild   Osteoarthritis    Osteopenia    Pneumonia 2004   Past Surgical History:  Procedure Laterality Date   Blocked kidney     BACK  SURGERY     CARDIOVASCULAR STRESS TEST  9/07   Neg   CATARACT EXTRACTION W/ INTRAOCULAR LENS IMPLANT Right 05/06/2017   Dr. Demetrios Finders   CATARACT EXTRACTION W/ INTRAOCULAR LENS IMPLANT Left 05/20/2017   Dr. Demetrios Finders   COLONOSCOPY  9/06   Diverticulosis, hems   COLONOSCOPY  04/04/2019   Dexa     Osteopenis 9/01, improved 10/03, decreased BMD 5/06   PARTIAL HYSTERECTOMY     UPPER GASTROINTESTINAL ENDOSCOPY  04/04/2019   US  ECHOCARDIOGRAPHY  7/03   Abd-normal   US  RENAL/AORTA  2007   Neg   Social History   Tobacco Use   Smoking status: Never   Smokeless tobacco: Never  Vaping Use   Vaping status: Never Used  Substance Use Topics   Alcohol use: No    Alcohol/week: 0.0 standard drinks of alcohol   Drug use: No   Family History  Problem Relation Age of Onset   Lung cancer Father    Cancer Sister        Bladder   Dementia Sister    Stroke Brother    Leukemia Brother    Heart attack Brother    Colon cancer Neg Hx    Colon polyps Neg Hx    Esophageal cancer Neg Hx    Rectal cancer Neg Hx    Stomach cancer Neg Hx    Allergies  Allergen Reactions   Alendronate Sodium     REACTION: acid reflux   Iodinated Contrast Media Other (See Comments)   Miacalcin [Calcitonin ([place])] Other (See Comments)    Pt thinks the spray gave her a staph infection in her nose    Pepcid  [Famotidine ] Diarrhea   Raloxifene     REACTION: severe muscle pain and weakness   Current Outpatient Medications on File Prior to Visit  Medication Sig Dispense Refill   acetaminophen  (TYLENOL ) 325 MG tablet Take 2 tablets (650 mg total) by mouth every 4 (four) hours as needed for mild pain or moderate pain (or Fever >/= 101).     albuterol  (VENTOLIN  HFA) 108 (90 Base) MCG/ACT inhaler Inhale 2 puffs into the lungs every 4 (four) hours as needed for wheezing or shortness of breath. 1 each 3   amoxicillin -clavulanate (AUGMENTIN) 875-125 MG tablet Take 1 tablet by mouth 2 (two) times daily. 20 tablet 0   aspirin  81 MG tablet Take 81 mg by mouth daily.       Bepotastine Besilate 1.5 % SOLN Place 1 drop into both eyes as needed.     Cholecalciferol (VITAMIN D -3) 1000 UNITS CAPS Take 2 capsules by mouth daily.     doxycycline  (VIBRA -TABS) 100 MG tablet Take 1 tablet (100 mg total) by mouth 2 (two) times daily. 20 tablet 0   fish oil-omega-3 fatty acids 1000 MG capsule Take 1 g by mouth 2 (two) times daily.      Fluticasone-Salmeterol (ADVAIR) 250-50  MCG/DOSE AEPB Inhale 1 puff into the lungs every 12 (twelve) hours.      guaiFENesin  (MUCINEX  PO) Take 1 capsule by mouth daily.     levocetirizine (XYZAL) 5 MG tablet Take 5 mg by mouth at bedtime.     meloxicam  (MOBIC ) 15 MG tablet Take 1 tablet by mouth daily.     montelukast (SINGULAIR) 10 MG tablet Take 10 mg by mouth daily.       omeprazole  (PRILOSEC) 20 MG capsule Take 1 capsule (20 mg total) by mouth daily. 90 capsule 1   predniSONE  (DELTASONE ) 20 MG tablet 3 tabs by mouth daily x 3 days, then 2 tabs by mouth daily x 2 days then 1 tab by mouth daily x 2 days 15 tablet 0   No current facility-administered medications on file prior to visit.    Review of Systems  Constitutional:  Positive for fatigue. Negative for activity change, appetite change, fever and unexpected weight change.  HENT:  Negative for congestion, ear pain, rhinorrhea, sinus pressure and sore throat.   Eyes:  Negative for pain, redness and visual disturbance.  Respiratory:  Positive for cough. Negative for shortness of breath and wheezing.   Cardiovascular:  Negative for chest pain and palpitations.  Gastrointestinal:  Negative for abdominal pain, blood in stool, constipation and diarrhea.  Endocrine: Negative for polydipsia and polyuria.  Genitourinary:  Negative for dysuria, frequency and urgency.  Musculoskeletal:  Positive for arthralgias. Negative for back pain and myalgias.  Skin:  Negative for pallor and rash.  Allergic/Immunologic: Negative for environmental allergies.   Neurological:  Negative for dizziness, syncope and headaches.  Hematological:  Negative for adenopathy. Does not bruise/bleed easily.  Psychiatric/Behavioral:  Negative for decreased concentration and dysphoric mood. The patient is nervous/anxious.        Objective:   Physical Exam Constitutional:      General: She is not in acute distress.    Appearance: Normal appearance. She is well-developed and normal weight. She is not ill-appearing or diaphoretic.  HENT:     Head: Normocephalic and atraumatic.     Nose: Nose normal.     Mouth/Throat:     Mouth: Mucous membranes are moist.     Pharynx: Oropharynx is clear.   Eyes:     General:        Right eye: No discharge.        Left eye: No discharge.     Conjunctiva/sclera: Conjunctivae normal.     Pupils: Pupils are equal, round, and reactive to light.   Neck:     Thyroid : No thyromegaly.     Vascular: No carotid bruit or JVD.   Cardiovascular:     Rate and Rhythm: Normal rate and regular rhythm.     Heart sounds: Normal heart sounds.     No gallop.  Pulmonary:     Effort: Pulmonary effort is normal. No respiratory distress.     Breath sounds: Normal breath sounds. No wheezing or rales.     Comments: Scant wheeze only on forced exp Harsh bs No rales Some upper airways sounds cleared by cough  Abdominal:     General: There is no distension or abdominal bruit.     Palpations: Abdomen is soft.   Musculoskeletal:     Cervical back: Normal range of motion and neck supple.     Right lower leg: No edema.     Left lower leg: No edema.  Lymphadenopathy:     Cervical: No cervical adenopathy.  Skin:    General: Skin is warm and dry.     Coloration: Skin is not pale.     Findings: No rash.     Comments: 1 cm abrasion on top of right foot/ no redness or swelling  Minimally tender    Neurological:     Mental Status: She is alert.     Coordination: Coordination normal.     Deep Tendon Reflexes: Reflexes are normal and  symmetric. Reflexes normal.   Psychiatric:        Mood and Affect: Mood normal.           Assessment & Plan:   Problem List Items Addressed This Visit       Respiratory   Moderate persistent asthma with exacerbation   Much improved with prednisone /albuterol  and treatment of CAP      CAP (community acquired pneumonia) - Primary   Seen by Dr Cherlyn Cornet with uri symptoms and wheezing Notes/ labs/cxr and plan reviewed Clinical improvement today  Reassuring exam/ wheezing is improved  Encouraged to finish the doxycycline  and augmentin and prednisone    Call back and Er precautions noted in detail today    Follow up 4-6 wk for re check and cxr  Is up to date for pna vaccine         Other   Right foot pain   Reviewed note from Dr Cherlyn Cornet on 6/13 and xray report  Injured without fracture  Abrasion appears to be healing today   Encouraged to keep clean with soap and water and elevate when able

## 2023-09-30 ENCOUNTER — Ambulatory Visit (INDEPENDENT_AMBULATORY_CARE_PROVIDER_SITE_OTHER)
Admission: RE | Admit: 2023-09-30 | Discharge: 2023-09-30 | Disposition: A | Discharge status: Home / Self Care | Source: Ambulatory Visit | Attending: Family Medicine | Admitting: Family Medicine

## 2023-09-30 ENCOUNTER — Encounter: Payer: Self-pay | Admitting: Family Medicine

## 2023-09-30 ENCOUNTER — Ambulatory Visit: Admitting: Family Medicine

## 2023-09-30 VITALS — BP 132/64 | HR 81 | Temp 98.2°F | Ht 58.75 in | Wt 138.4 lb

## 2023-09-30 DIAGNOSIS — J189 Pneumonia, unspecified organism: Secondary | ICD-10-CM | POA: Diagnosis not present

## 2023-09-30 DIAGNOSIS — J4541 Moderate persistent asthma with (acute) exacerbation: Secondary | ICD-10-CM | POA: Diagnosis not present

## 2023-09-30 NOTE — Patient Instructions (Addendum)
 Chest xray today to re check lungs   Lungs sound better   Glad symptoms are improved  Chest xray now  We will reach out with results when they return   If cough /wheezing or other symptoms worsen please call  Be careful with changes in temperature or environment   Keep taking good deep breaths to keep lungs clear  Also make sure to drink enough fluids

## 2023-09-30 NOTE — Assessment & Plan Note (Signed)
 Much clinical improvement  Stamina is improving  Reassuring exam  Pulse ox is 93% w/o complaint of shortness of breath  Kyphosis may also play role  Cxr ordered for re check today

## 2023-09-30 NOTE — Progress Notes (Signed)
 Subjective:    Patient ID: Erika Evans, female    DOB: [birth year], 89 y.o.   MRN: [id number]  HPI  Wt Readings from Last 3 Encounters:  09/30/23 138 lb 6.4 oz (62.8 kg)  08/26/23 140 lb 6 oz (63.7 kg)  08/19/23 138 lb 2 oz (62.7 kg)   28.19 kg/m  Vitals:   09/30/23 1427  BP: 132/64  Pulse: 81  Temp: 98.2 F (36.8 C)  SpO2: 93%    Pt presents for follow up of  Community acquired pneumonia    Treated with  Doxycycline   Augmentin   Prednisone   Overall doing better  Cough -just a little after she eats  Phlegm- not much / usually white  No wheezing (not now -only when she has lung congestion)  No shortness of breath  May be from allergies - always worse outside   Getting stronger and more energy   Last cxr 6/13 DG Chest 2 View Result Date: 08/19/2023 CLINICAL DATA:  Cough and dyspnea EXAM: CHEST - 2 VIEW COMPARISON:  Chest radiograph dated 05/12/2022 FINDINGS: Patient is rotated to the right. Normal lung volumes. Similar bilateral lower lung patchy and linear opacities. Trace blunting of the right costophrenic angle. No pneumothorax. Similar cardiomediastinal silhouette. No acute osseous abnormality. IMPRESSION: 1. Similar bilateral lower lung patchy and linear opacities, which may represent atelectasis, aspiration, or pneumonia. 2. Trace blunting of the right costophrenic angle, which may represent a trace pleural effusion  Last pna vaccine was 06/2023    Patient Active Problem List   Diagnosis Date Noted   Right foot pain 08/19/2023   Moderate persistent asthma with exacerbation 08/19/2023   Fatigue 07/05/2022   Encounter for screening mammogram for breast cancer 07/05/2022   SOB (shortness of breath) 05/12/2022   Current use of proton pump inhibitor 07/03/2021   Allergic rhinitis 07/03/2021   Erosive osteoarthritis of both hands 03/11/2021   Aortic atherosclerosis (HCC) 11/05/2020   CAP (community acquired pneumonia) 10/25/2020   Venous insufficiency of  both lower extremities 12/21/2017   Routine general medical examination at a health care facility 01/28/2015   Estrogen deficiency 01/28/2015   Encounter for Medicare annual wellness exam 11/26/2012   GERD (gastroesophageal reflux disease) 04/21/2011   OA (osteoarthritis) 04/21/2011   Hyperlipidemia 04/21/2011   Osteopenia 04/21/2011   Past Medical History:  Diagnosis Date   Asthma    Cataract 2019   resolved with surgery   Congenital fusion of cervical spine    Elevated liver enzymes    Foot pain    Left; nerve problem; procedure   GERD (gastroesophageal reflux disease)    Hepatitis B carrier (HCC)    Positive hep B test at [facility]   Hyperlipidemia    Mild   Osteoarthritis    Osteopenia    Pneumonia 2004   Past Surgical History:  Procedure Laterality Date   Blocked kidney     BACK SURGERY     CARDIOVASCULAR STRESS TEST  9/07   Neg   CATARACT EXTRACTION W/ INTRAOCULAR LENS IMPLANT Right 05/06/2017   Dr. Milan   CATARACT EXTRACTION W/ INTRAOCULAR LENS IMPLANT Left 05/20/2017   Dr. Milan   COLONOSCOPY  9/06   Diverticulosis, hems   COLONOSCOPY  04/04/2019   Dexa     Osteopenis 9/01, improved 10/03, decreased BMD 5/06   PARTIAL HYSTERECTOMY     UPPER GASTROINTESTINAL ENDOSCOPY  04/04/2019   US  ECHOCARDIOGRAPHY  7/03   Abd-normal   US  RENAL/AORTA  2007   Neg  Social History   Tobacco Use   Smoking status: Never   Smokeless tobacco: Never  Vaping Use   Vaping status: Never Used  Substance Use Topics   Alcohol use: No    Alcohol/week: 0.0 standard drinks of alcohol   Drug use: No   Family History  Problem Relation Age of Onset   Lung cancer Father    Cancer Sister        Bladder   Dementia Sister    Stroke Brother    Leukemia Brother    Heart attack Brother    Colon cancer Neg Hx    Colon polyps Neg Hx    Esophageal cancer Neg Hx    Rectal cancer Neg Hx    Stomach cancer Neg Hx    Allergies  Allergen Reactions   Alendronate Sodium      REACTION: acid reflux   Iodinated Contrast Media Other (See Comments)   Miacalcin [Calcitonin ([place])] Other (See Comments)    Pt thinks the spray gave her a staph infection in her nose    Pepcid  [Famotidine ] Diarrhea   Raloxifene     REACTION: severe muscle pain and weakness   Current Outpatient Medications on File Prior to Visit  Medication Sig Dispense Refill   acetaminophen  (TYLENOL ) 325 MG tablet Take 2 tablets (650 mg total) by mouth every 4 (four) hours as needed for mild pain or moderate pain (or Fever >/= 101).     albuterol  (VENTOLIN  HFA) 108 (90 Base) MCG/ACT inhaler Inhale 2 puffs into the lungs every 4 (four) hours as needed for wheezing or shortness of breath. 1 each 3   amoxicillin -clavulanate (AUGMENTIN ) 875-125 MG tablet Take 1 tablet by mouth 2 (two) times daily. 20 tablet 0   aspirin 81 MG tablet Take 81 mg by mouth daily.       Bepotastine Besilate 1.5 % SOLN Place 1 drop into both eyes as needed.     Cholecalciferol (VITAMIN D -3) 1000 UNITS CAPS Take 2 capsules by mouth daily.     doxycycline  (VIBRA -TABS) 100 MG tablet Take 1 tablet (100 mg total) by mouth 2 (two) times daily. 20 tablet 0   fish oil-omega-3 fatty acids 1000 MG capsule Take 1 g by mouth 2 (two) times daily.      Fluticasone-Salmeterol (ADVAIR) 250-50 MCG/DOSE AEPB Inhale 1 puff into the lungs every 12 (twelve) hours.      levocetirizine (XYZAL) 5 MG tablet Take 5 mg by mouth at bedtime.     meloxicam  (MOBIC ) 15 MG tablet Take 1 tablet by mouth daily.     montelukast (SINGULAIR) 10 MG tablet Take 10 mg by mouth daily.       omeprazole  (PRILOSEC) 20 MG capsule Take 1 capsule (20 mg total) by mouth daily. 90 capsule 1   guaiFENesin  (MUCINEX  PO) Take 1 capsule by mouth daily. (Patient not taking: Reported on 09/30/2023)     No current facility-administered medications on file prior to visit.    Review of Systems  Constitutional:  Positive for fatigue. Negative for activity change, appetite change, fever  and unexpected weight change.       Fatigue is improved   HENT:  Positive for rhinorrhea. Negative for congestion, ear pain, sinus pressure and sore throat.   Eyes:  Negative for pain, redness and visual disturbance.  Respiratory:  Positive for cough. Negative for shortness of breath, wheezing and stridor.        Much less cough  Occational wheeze, not today  Cardiovascular:  Negative for chest pain and palpitations.  Gastrointestinal:  Negative for abdominal pain, blood in stool, constipation and diarrhea.  Endocrine: Negative for polydipsia and polyuria.  Genitourinary:  Negative for dysuria, frequency and urgency.  Musculoskeletal:  Positive for arthralgias. Negative for back pain and myalgias.  Skin:  Negative for pallor and rash.  Allergic/Immunologic: Negative for environmental allergies.  Neurological:  Negative for dizziness, syncope and headaches.  Hematological:  Negative for adenopathy. Does not bruise/bleed easily.  Psychiatric/Behavioral:  Negative for decreased concentration and dysphoric mood. The patient is not nervous/anxious.        Objective:   Physical Exam Constitutional:      General: She is not in acute distress.    Appearance: Normal appearance. She is well-developed and normal weight. She is not ill-appearing or diaphoretic.  HENT:     Head: Normocephalic and atraumatic.  Eyes:     Conjunctiva/sclera: Conjunctivae normal.     Pupils: Pupils are equal, round, and reactive to light.  Neck:     Thyroid : No thyromegaly.     Vascular: No carotid bruit or JVD.  Cardiovascular:     Rate and Rhythm: Normal rate and regular rhythm.     Heart sounds: Normal heart sounds.     No gallop.  Pulmonary:     Effort: Pulmonary effort is normal. No respiratory distress.     Breath sounds: Normal breath sounds. No stridor. No wheezing, rhonchi or rales.     Comments: Good air exch No rales or crackles  No wheeze today even with forced exp   Occational upper airway  sounds cleared by cough Chest:     Chest wall: No tenderness.  Abdominal:     General: There is no distension or abdominal bruit.     Palpations: Abdomen is soft.  Musculoskeletal:     Cervical back: Normal range of motion and neck supple.     Right lower leg: No edema.     Left lower leg: No edema.     Comments: Moderate kyphosis   Lymphadenopathy:     Cervical: No cervical adenopathy.  Skin:    General: Skin is warm and dry.     Coloration: Skin is not pale.     Findings: No rash.  Neurological:     Mental Status: She is alert.     Coordination: Coordination normal.     Deep Tendon Reflexes: Reflexes are normal and symmetric. Reflexes normal.  Psychiatric:        Mood and Affect: Mood normal.           Assessment & Plan:   Problem List Items Addressed This Visit       Respiratory   Moderate persistent asthma with exacerbation   Improved Has albuterol  prn  Encouraged using caution in heat/change in temp  No wheezing on exam today      CAP (community acquired pneumonia) - Primary   Much clinical improvement  Stamina is improving  Reassuring exam  Pulse ox is 93% w/o complaint of shortness of breath  Kyphosis may also play role  Cxr ordered for re check today      Relevant Orders   DG Chest 2 View

## 2023-09-30 NOTE — Assessment & Plan Note (Signed)
 Improved Has albuterol  prn  Encouraged using caution in heat/change in temp  No wheezing on exam today

## 2023-10-02 ENCOUNTER — Ambulatory Visit: Payer: Self-pay | Admitting: Family Medicine

## 2023-10-02 DIAGNOSIS — J189 Pneumonia, unspecified organism: Secondary | ICD-10-CM

## 2023-10-02 DIAGNOSIS — R9389 Abnormal findings on diagnostic imaging of other specified body structures: Secondary | ICD-10-CM

## 2023-10-03 DIAGNOSIS — R9389 Abnormal findings on diagnostic imaging of other specified body structures: Secondary | ICD-10-CM | POA: Insufficient documentation

## 2023-10-03 NOTE — Telephone Encounter (Signed)
 I put the referral in for CT scan stat Please let referrals know  Please let us  know if you don't hear in next several days to set that up

## 2023-10-03 NOTE — Telephone Encounter (Signed)
 Called patient and reviewed all information. Patient verbalized understanding.  She is agreeable to CT scan, prefers [place] location.  Will call if any further questions.

## 2023-10-03 NOTE — Telephone Encounter (Signed)
 Copied from CRM [id number]. Topic: Clinical - Lab/Test Results >> Oct 03, 2023 12:33 PM Carlyon D wrote: Reason for CRM: Pt calling back in regards to a VM she received from CMA Kellie. PT is aware that clinic is on lunch right now and is asking for a call back when clinic returns. Call is in regards to Imaging.

## 2023-10-04 NOTE — Telephone Encounter (Signed)
 Spoke with patient, she does not think that she will be able to do this CT Chest this week. I gave her the number to call and schedule at her convenience. I did explain the importance of getting this done sooner than later - she states that she will see what she can do.

## 2023-10-07 ENCOUNTER — Ambulatory Visit: Payer: Self-pay

## 2023-10-07 NOTE — Telephone Encounter (Signed)
 Aware, will watch for correspondence Thanks for seeing her

## 2023-10-07 NOTE — Telephone Encounter (Signed)
 FYI Only or Action Required?: FYI only for provider.  Patient was last seen in primary care on 09/30/2023 by Randeen Laine LABOR, MD.  Called Nurse Triage reporting No chief complaint on file..  Symptoms began today.  Interventions attempted: Rest, hydration, or home remedies.  Symptoms are: stable.  Triage Disposition: See Physician Within 24 Hours  Patient/caregiver understands and will follow disposition?: Yes  Spoke with Connell, Nurse with [facility] [phone]  Copied from CRM [id number]. Topic: Clinical - Red Word Triage >> Oct 07, 2023  3:29 PM Franky GRADE wrote: Red Word that prompted transfer to Nurse Triage: Carly with [facility] is calling because she is currently with the patient and has an irregular heart beat. No other symptoms. Reason for Disposition  New-onset or worsening ankle swelling  Answer Assessment - Initial Assessment Questions 1. DESCRIPTION: Please describe your heart rate or heartbeat that you are having (e.g., fast/slow, regular/irregular, skipped or extra beats, palpitations)     Irregular heartbeat on examination  2. ONSET: When did it start? (e.g., minutes, hours, days)       Auscultated today  3. DURATION: How long does it last (e.g., seconds, minutes, hours)      Irregularly irregular  4. PATTERN Does it come and go, or has it been constant since it started?  Does it get worse with exertion?   Are you feeling it now?     Unsure  5. TAP: Using your hand, can you tap out what you are feeling on a chair or table in front of you, so that I can hear? Note: Not all patients can do this.       Unable  6. HEART RATE: Can you tell me your heart rate? How many beats in 15 seconds?  Note: Not all patients can do this.       70's 7. RECURRENT SYMPTOM: Have you ever had this before? If Yes, ask: When was the last time? and What happened that time?      Yes  8. CAUSE: What do you think is causing the palpitations?     Unsure  9.  CARDIAC HISTORY: Do you have any history of heart disease? (e.g., heart attack, angina, bypass surgery, angioplasty, arrhythmia)      Venous Insufficiency  10. OTHER SYMPTOMS: Do you have any other symptoms? (e.g., dizziness, chest pain, sweating, difficulty breathing)       Denies symptoms  11. PREGNANCY: Is there any chance you are pregnant? When was your last menstrual period?       No and No   Spoke with Carly, Nurse from [facility] who is currently in home with patient, for triage assessment. Call back number: [phone]  Protocols used: Heart Rate and Heartbeat Questions-A-AH

## 2023-10-07 NOTE — Telephone Encounter (Signed)
 Appt scheduled Monday with Dr. Jimmy, will route to him and PCP as a fyi

## 2023-10-08 NOTE — Telephone Encounter (Signed)
 Could just be a normal variant but I will assess at the visit

## 2023-10-10 ENCOUNTER — Ambulatory Visit: Admitting: Internal Medicine

## 2023-10-10 ENCOUNTER — Encounter: Payer: Self-pay | Admitting: Internal Medicine

## 2023-10-10 VITALS — BP 130/80 | HR 84 | Temp 97.8°F | Ht 58.75 in | Wt 139.2 lb

## 2023-10-10 DIAGNOSIS — I499 Cardiac arrhythmia, unspecified: Secondary | ICD-10-CM | POA: Diagnosis not present

## 2023-10-10 NOTE — Progress Notes (Signed)
 Subjective:    Patient ID: Erika Evans, female    DOB: 05/20/1934, 89 y.o.   MRN: [id number]  HPI Here due to irregular heart beat  She doesn't feel a skipped beat Has had before--did see Dr Darliss in 2022 Had home visit from [facility] NP---she noticed the skips No palpitations No SOB other than with infections No dizziness or syncope  Remembers some chest pain some months ago Might have been due to infection though Due for repeat chest x-ray  Current Outpatient Medications on File Prior to Visit  Medication Sig Dispense Refill   acetaminophen  (TYLENOL ) 325 MG tablet Take 2 tablets (650 mg total) by mouth every 4 (four) hours as needed for mild pain or moderate pain (or Fever >/= 101).     albuterol  (VENTOLIN  HFA) 108 (90 Base) MCG/ACT inhaler Inhale 2 puffs into the lungs every 4 (four) hours as needed for wheezing or shortness of breath. 1 each 3   aspirin 81 MG tablet Take 81 mg by mouth daily.       Bepotastine Besilate 1.5 % SOLN Place 1 drop into both eyes as needed.     Cholecalciferol (VITAMIN D -3) 1000 UNITS CAPS Take 2 capsules by mouth daily.     doxycycline  (VIBRA -TABS) 100 MG tablet Take 1 tablet (100 mg total) by mouth 2 (two) times daily. 20 tablet 0   fish oil-omega-3 fatty acids 1000 MG capsule Take 1 g by mouth 2 (two) times daily.      Fluticasone-Salmeterol (ADVAIR) 250-50 MCG/DOSE AEPB Inhale 1 puff into the lungs every 12 (twelve) hours.      levocetirizine (XYZAL) 5 MG tablet Take 5 mg by mouth at bedtime.     meloxicam  (MOBIC ) 15 MG tablet Take 1 tablet by mouth daily.     montelukast (SINGULAIR) 10 MG tablet Take 10 mg by mouth daily.       omeprazole  (PRILOSEC) 20 MG capsule Take 1 capsule (20 mg total) by mouth daily. 90 capsule 1   No current facility-administered medications on file prior to visit.    Allergies  Allergen Reactions   Alendronate Sodium     REACTION: acid reflux   Iodinated Contrast Media Other (See Comments)   Miacalcin  [Calcitonin ([place])] Other (See Comments)    Pt thinks the spray gave her a staph infection in her nose    Pepcid  [Famotidine ] Diarrhea   Raloxifene     REACTION: severe muscle pain and weakness    Past Medical History:  Diagnosis Date   Asthma    Cataract 2019   resolved with surgery   Congenital fusion of cervical spine    Elevated liver enzymes    Foot pain    Left; nerve problem; procedure   GERD (gastroesophageal reflux disease)    Hepatitis B carrier (HCC)    Positive hep B test at [facility]   Hyperlipidemia    Mild   Osteoarthritis    Osteopenia    Pneumonia 2004    Past Surgical History:  Procedure Laterality Date   Blocked kidney     BACK SURGERY     CARDIOVASCULAR STRESS TEST  9/07   Neg   CATARACT EXTRACTION W/ INTRAOCULAR LENS IMPLANT Right 05/06/2017   Dr. Milan   CATARACT EXTRACTION W/ INTRAOCULAR LENS IMPLANT Left 05/20/2017   Dr. Milan   COLONOSCOPY  9/06   Diverticulosis, hems   COLONOSCOPY  04/04/2019   Dexa     Osteopenis 9/01, improved 10/03, decreased BMD 5/06  PARTIAL HYSTERECTOMY     UPPER GASTROINTESTINAL ENDOSCOPY  04/04/2019   US  ECHOCARDIOGRAPHY  7/03   Abd-normal   US  RENAL/AORTA  2007   Neg    Family History  Problem Relation Age of Onset   Lung cancer Father    Cancer Sister        Bladder   Dementia Sister    Stroke Brother    Leukemia Brother    Heart attack Brother    Colon cancer Neg Hx    Colon polyps Neg Hx    Esophageal cancer Neg Hx    Rectal cancer Neg Hx    Stomach cancer Neg Hx     Social History   Socioeconomic History   Marital status: Widowed    Spouse name: Not on file   Number of children: 0   Years of education: Not on file   Highest education level: Not on file  Occupational History    Employer: [employer]    Comment: Retired  Tobacco Use   Smoking status: Never   Smokeless tobacco: Never  Vaping Use   Vaping status: Never Used  Substance and Sexual Activity   Alcohol use: No     Alcohol/week: 0.0 standard drinks of alcohol   Drug use: No   Sexual activity: Not Currently  Other Topics Concern   Not on file  Social History Narrative   Lost husband November 2004.   Lives alone   Is a twin (atually triplet but third (boy) died at birth)   Sometimes walks for exercise.   Retired [employer]   No children   Never smoker, No ETOH, rare caffeine   Social Drivers of Corporate investment banker Strain: Low Risk  (04/04/2023)   Overall Financial Resource Strain (CARDIA)    Difficulty of Paying Living Expenses: Not hard at all  Food Insecurity: No Food Insecurity (04/04/2023)   Hunger Vital Sign    Worried About Running Out of Food in the Last Year: Never true    Ran Out of Food in the Last Year: Never true  Transportation Needs: No Transportation Needs (04/04/2023)   PRAPARE - Administrator, Civil Service (Medical): No    Lack of Transportation (Non-Medical): No  Physical Activity: Inactive (04/04/2023)   Exercise Vital Sign    Days of Exercise per Week: 0 days    Minutes of Exercise per Session: 0 min  Stress: No Stress Concern Present (04/04/2023)   [employer] of Occupational Health - Occupational Stress Questionnaire    Feeling of Stress : Not at all  Social Connections: Moderately Isolated (04/04/2023)   Social Connection and Isolation Panel    Frequency of Communication with Friends and Family: More than three times a week    Frequency of Social Gatherings with Friends and Family: Once a week    Attends Religious Services: 1 to 4 times per year    Active Member of [employer] or Organizations: No    Attends Banker Meetings: Never    Marital Status: Widowed  Intimate Partner Violence: Not At Risk (04/04/2023)   Humiliation, Afraid, Rape, and Kick questionnaire    Fear of Current or Ex-Partner: No    Emotionally Abused: No    Physically Abused: No    Sexually Abused: No   Review of Systems No fever now--but recent  pneumonia Some nausea with coughing Eating okay No caffeine    Objective:   Physical Exam Constitutional:  Appearance: Normal appearance.  Cardiovascular:     Rate and Rhythm: Normal rate. Rhythm irregular.     Heart sounds: No murmur heard.    No gallop.     Comments: Irregular at times---other times, sounds like regular with ectopy Pulmonary:     Effort: Pulmonary effort is normal.     Breath sounds: Normal breath sounds. No wheezing or rales.  Musculoskeletal:     Cervical back: Neck supple.     Right lower leg: No edema.     Left lower leg: No edema.  Lymphadenopathy:     Cervical: No cervical adenopathy.  Neurological:     Mental Status: She is alert.            Assessment & Plan:

## 2023-10-10 NOTE — Assessment & Plan Note (Addendum)
 No symptoms -just found on home exam by NP Long standing PACs---seen by cardiology in 2022 and no action needed Sounds like regular with ectopy---but very frequent at times Will check EKG--shows sinus at 90 with frequent PACs. Normal intervals. No ischemia or hypertrophy. No change from previous  Reassured---no action needed

## 2023-10-11 ENCOUNTER — Ambulatory Visit
Admission: RE | Admit: 2023-10-11 | Discharge: 2023-10-11 | Disposition: A | Discharge status: Home / Self Care | Source: Ambulatory Visit | Attending: Family Medicine | Admitting: Family Medicine

## 2023-10-11 ENCOUNTER — Ambulatory Visit: Payer: Self-pay | Admitting: Family Medicine

## 2023-10-11 DIAGNOSIS — J189 Pneumonia, unspecified organism: Secondary | ICD-10-CM | POA: Insufficient documentation

## 2023-10-11 DIAGNOSIS — R9389 Abnormal findings on diagnostic imaging of other specified body structures: Secondary | ICD-10-CM | POA: Insufficient documentation

## 2023-11-03 ENCOUNTER — Other Ambulatory Visit: Payer: Self-pay | Admitting: Family Medicine

## 2023-11-03 MED ORDER — OMEPRAZOLE 20 MG PO CPDR: 20.0000 mg | DELAYED_RELEASE_CAPSULE | Freq: Every day | ORAL | 1 refills | Status: AC

## 2023-11-03 NOTE — Addendum Note (Signed)
 Addended by: RAYLEEN GLENDORA HERO on: 11/03/2023 12:18 PM   Modules accepted: Orders

## 2024-04-06 ENCOUNTER — Ambulatory Visit: Payer: Medicare Other

## 2024-04-12 ENCOUNTER — Ambulatory Visit

## 2024-04-12 VITALS — Ht 59.0 in | Wt 135.0 lb

## 2024-04-12 DIAGNOSIS — Z0000 Encounter for general adult medical examination without abnormal findings: Secondary | ICD-10-CM

## 2024-04-12 NOTE — Patient Instructions (Signed)
 [name],  Thank you for taking the time for your Medicare Wellness Visit. I appreciate your continued commitment to your health goals. Please review the care plan we discussed, and feel free to reach out if I can assist you further.  Please note that Annual Wellness Visits do not include a physical exam. Some assessments may be limited, especially if the visit was conducted virtually. If needed, we may recommend an in-person follow-up with your provider.  Ongoing Care Seeing your primary care provider every 3 to 6 months helps us  monitor your health and provide consistent, personalized care.   Referrals If a referral was made during today's visit and you haven't received any updates within two weeks, please contact the referred provider directly to check on the status.  Recommended Screenings:  Health Maintenance  Topic Date Due   Breast Cancer Screening  07/29/2023   Flu Shot  10/07/2023   Medicare Annual Wellness Visit  04/03/2024   DTaP/Tdap/Td vaccine (2 - Tdap) 07/10/2024*   COVID-19 Vaccine (2 - Pfizer risk series) 07/26/2024*   Zoster (Shingles) Vaccine (1 of 2) 10/10/2024*   Pneumococcal Vaccine for age over 27  Completed   Osteoporosis screening with Bone Density Scan  Completed   Meningitis B Vaccine  Aged Out  *Topic was postponed. The date shown is not the original due date.       04/12/2024    4:04 PM  Advanced Directives  Does Patient Have a Medical Advance Directive? Yes  Type of Estate Agent of [place];Living will  Does patient want to make changes to medical advance directive? No - Patient declined  Copy of Healthcare Power of Attorney in Chart? Yes - validated most recent copy scanned in chart (See row information)    Vision: Annual vision screenings are recommended for early detection of glaucoma, cataracts, and diabetic retinopathy. These exams can also reveal signs of chronic conditions such as diabetes and high blood  pressure.  Dental: Annual dental screenings help detect early signs of oral cancer, gum disease, and other conditions linked to overall health, including heart disease and diabetes.  Please see the attached documents for additional preventive care recommendations.

## 2024-04-12 NOTE — Progress Notes (Signed)
 "  Chief Complaint  Patient presents with   Medicare Wellness     Subjective:   Erika Evans is a 89 y.o. female who presents for a Medicare Annual Wellness Visit.  Visit info / Clinical Intake: Medicare Wellness Visit Type:: Subsequent Annual Wellness Visit Persons participating in visit and providing information:: patient Medicare Wellness Visit Mode:: Video Since this visit was completed virtually, some vitals may be partially provided or unavailable. Missing vitals are due to the limitations of the virtual format.: Documented vitals are patient reported If Telephone or Video please confirm:: I connected with patient using audio/video enable telemedicine. I verified patient identity with two identifiers, discussed telehealth limitations, and patient agreed to proceed. Patient Location:: home Provider Location:: office Interpreter Needed?: No Pre-visit prep was completed: yes AWV questionnaire completed by patient prior to visit?: no Living arrangements:: (!) lives alone Patient's Overall Health Status Rating: very good Typical amount of pain: some Does pain affect daily life?: no Are you currently prescribed opioids?: no  Dietary Habits and Nutritional Risks How many meals a day?: 2 Eats fruit and vegetables daily?: yes Most meals are obtained by: preparing own meals In the last 2 weeks, have you had any of the following?: none Diabetic:: no  Functional Status Activities of Daily Living (to include ambulation/medication): Independent Ambulation: Independent Medication Administration: Independent Home Management (perform basic housework or laundry): Independent Manage your own finances?: yes Primary transportation is: driving Concerns about vision?: no *vision screening is required for WTM* Concerns about hearing?: no  Fall Screening Falls in the past year?: 1 (slippes on ice) Number of falls in past year: 0 Was there an injury with Fall?: 0 Fall Risk Category  Calculator: 1 Patient Fall Risk Level: Low Fall Risk  Fall Risk Patient at Risk for Falls Due to: Medication side effect Fall risk Follow up: Falls prevention discussed; Education provided; Falls evaluation completed  Home and Transportation Safety: All rugs have non-skid backing?: yes All stairs or steps have railings?: yes Grab bars in the bathtub or shower?: (!) no Have non-skid surface in bathtub or shower?: yes Good home lighting?: yes Regular seat belt use?: yes Hospital stays in the last year:: no  Cognitive Assessment Difficulty concentrating, remembering, or making decisions? : no Will 6CIT or Mini Cog be Completed: yes What year is it?: 0 points What month is it?: 0 points Give patient an address phrase to remember (5 components): [address] [place] About what time is it?: 0 points Count backwards from 20 to 1: 0 points Say the months of the year in reverse: 2 points Repeat the address phrase from earlier: 0 points 6 CIT Score: 2 points  Advance Directives (For Healthcare) Does Patient Have a Medical Advance Directive?: Yes Does patient want to make changes to medical advance directive?: No - Patient declined Type of Advance Directive: Healthcare Power of [place]; Living will Copy of Healthcare Power of Attorney in Chart?: Yes - validated most recent copy scanned in chart (See row information) Copy of Living Will in Chart?: Yes - validated most recent copy scanned in chart (See row information)  Reviewed/Updated  Reviewed/Updated: Reviewed All (Medical, Surgical, Family, Medications, Allergies, Care Teams, Patient Goals)    Allergies (verified) Alendronate sodium, Iodinated contrast media, Miacalcin [calcitonin (salmon)], Pepcid  [famotidine ], and Raloxifene   Current Medications (verified) Outpatient Encounter Medications as of 04/12/2024  Medication Sig   acetaminophen  (TYLENOL ) 325 MG tablet Take 2 tablets (650 mg total) by mouth every 4 (four) hours as  needed for mild pain or moderate pain (or Fever >/= 101).   albuterol  (VENTOLIN  HFA) 108 (90 Base) MCG/ACT inhaler Inhale 2 puffs into the lungs every 4 (four) hours as needed for wheezing or shortness of breath.   aspirin 81 MG tablet Take 81 mg by mouth daily.     Bepotastine Besilate 1.5 % SOLN Place 1 drop into both eyes as needed.   Cholecalciferol (VITAMIN D -3) 1000 UNITS CAPS Take 2 capsules by mouth daily.   fish oil-omega-3 fatty acids 1000 MG capsule Take 1 g by mouth 2 (two) times daily.    Fluticasone-Salmeterol (ADVAIR) 250-50 MCG/DOSE AEPB Inhale 1 puff into the lungs every 12 (twelve) hours.    levocetirizine (XYZAL) 5 MG tablet Take 5 mg by mouth at bedtime.   meloxicam  (MOBIC ) 15 MG tablet Take 1 tablet by mouth daily.   omeprazole  (PRILOSEC) 20 MG capsule Take 1 capsule (20 mg total) by mouth daily.   doxycycline  (VIBRA -TABS) 100 MG tablet Take 1 tablet (100 mg total) by mouth 2 (two) times daily. (Patient not taking: Reported on 04/12/2024)   montelukast (SINGULAIR) 10 MG tablet Take 10 mg by mouth daily.   (Patient not taking: Reported on 04/12/2024)   No facility-administered encounter medications on file as of 04/12/2024.    History: Past Medical History:  Diagnosis Date   Asthma    Cataract 2019   resolved with surgery   Congenital fusion of cervical spine    Elevated liver enzymes    Foot pain    Left; nerve problem; procedure   GERD (gastroesophageal reflux disease)    Hepatitis B carrier (HCC)    [facility] hep B [facility]   Hyperlipidemia    Mild   Osteoarthritis    Osteopenia    Pneumonia 2004   Past Surgical History:  Procedure Laterality Date   Blocked kidney     BACK SURGERY     CARDIOVASCULAR STRESS TEST  9/07   Neg   CATARACT EXTRACTION W/ INTRAOCULAR LENS IMPLANT Right 05/06/2017   Dr. Milan   CATARACT EXTRACTION W/ INTRAOCULAR LENS IMPLANT Left 05/20/2017   Dr. Milan   COLONOSCOPY  9/06   Diverticulosis, hems   COLONOSCOPY   04/04/2019   Dexa     Osteopenis 9/01, improved 10/03, decreased BMD 5/06   PARTIAL HYSTERECTOMY     UPPER GASTROINTESTINAL ENDOSCOPY  04/04/2019   US  ECHOCARDIOGRAPHY  7/03   Abd-normal   US  RENAL/AORTA  2007   Neg   Family History  Problem Relation Age of Onset   Lung cancer Father    Cancer Sister        Bladder   Dementia Sister    Stroke Brother    Leukemia Brother    Heart attack Brother    Colon cancer Neg Hx    Colon polyps Neg Hx    Esophageal cancer Neg Hx    Rectal cancer Neg Hx    Stomach cancer Neg Hx    Social History   Occupational History    Employer: [employer]    Comment: Retired  Tobacco Use   Smoking status: Never   Smokeless tobacco: Never  Vaping Use   Vaping status: Never Used  Substance and Sexual Activity   Alcohol use: No    Alcohol/week: 0.0 standard drinks of alcohol   Drug use: No   Sexual activity: Not Currently   Tobacco Counseling Counseling given: Not Answered  SDOH Screenings   Food Insecurity: No Food Insecurity (  04/12/2024)  Housing: Low Risk (04/12/2024)  Transportation Needs: No Transportation Needs (04/12/2024)  Utilities: Not At Risk (04/12/2024)  Alcohol Screen: Low Risk (04/12/2024)  Depression (PHQ2-9): Low Risk (04/12/2024)  Financial Resource Strain: Low Risk (04/12/2024)  Physical Activity: Inactive (04/12/2024)  Social Connections: Moderately Integrated (04/12/2024)  Stress: No Stress Concern Present (04/12/2024)  Tobacco Use: Low Risk (04/12/2024)  Health Literacy: Adequate Health Literacy (04/12/2024)   See flowsheets for full screening details  Depression Screen PHQ 2 & 9 Depression Scale- Over the past 2 weeks, how often have you been bothered by any of the following problems? Little interest or pleasure in doing things: 0 Feeling down, depressed, or hopeless (PHQ Adolescent also includes...irritable): 1 (cabin fever from snow) PHQ-2 Total Score: 1 Trouble falling or staying asleep, or sleeping too much: 1 Feeling tired or  having little energy: 0 Poor appetite or overeating (PHQ Adolescent also includes...weight loss): 0 Feeling bad about yourself - or that you are a failure or have let yourself or your family down: 0 Trouble concentrating on things, such as reading the newspaper or watching television (PHQ Adolescent also includes...like school work): 0 Moving or speaking so slowly that other people could have noticed. Or the opposite - being so fidgety or restless that you have been moving around a lot more than usual: 0 Thoughts that you would be better off dead, or of hurting yourself in some way: 0 PHQ-9 Total Score: 2 If you checked off any problems, how difficult have these problems made it for you to do your work, take care of things at home, or get along with other people?: Not difficult at all  Depression Treatment Depression Interventions/Treatment : [id number] Score <4 Follow-up Not Indicated     Goals Addressed               This Visit's Progress     wants to lose weight (pt-stated)               Objective:    Today's Vitals   04/12/24 1551  Weight: 135 lb (61.2 kg)  Height: 4' 11 (1.499 m)   Body mass index is 27.27 kg/m.  Hearing/Vision screen Vision Screening - Comments:: Regular eye exams Immunizations and Health Maintenance Health Maintenance  Topic Date Due   Mammogram  07/29/2023   Influenza Vaccine  10/07/2023   DTaP/Tdap/Td (2 - Tdap) 07/10/2024 (Originally 04/20/2021)   COVID-19 Vaccine (2 - Pfizer risk series) 07/26/2024 (Originally 01/13/2021)   Zoster Vaccines- Shingrix (1 of 2) 10/10/2024 (Originally 07/23/1953)   Medicare Annual Wellness (AWV)  04/12/2025   Pneumococcal Vaccine: 50+ Years  Completed   Bone Density Scan  Completed   Meningococcal B Vaccine  Aged Out        Assessment/Plan:  This is a routine wellness examination for [facility].  Patient Care Team: Tower, Laine LABOR, MD as PCP - General [place], [place], [place] (Optometry) Frutoso Luz, MD as  Referring Physician (Allergy)  I have personally reviewed and noted the following in the patients chart:   Medical and social history Use of alcohol, tobacco or illicit drugs  Current medications and supplements including opioid prescriptions. Functional ability and status Nutritional status Physical activity Advanced directives List of other physicians Hospitalizations, surgeries, and ER visits in previous 12 months Vitals Screenings to include cognitive, depression, and falls Referrals and appointments  No orders of the defined types were placed in this encounter.  In addition, I have reviewed and discussed with patient certain preventive protocols, quality  metrics, and best practice recommendations. A written personalized care plan for preventive services as well as general preventive health recommendations were provided to patient.   Ardella FORBES Dawn, LPN   09/09/7971   Return in 1 year (on 04/12/2025).  After Visit Summary: (MyChart) Due to this being a telephonic visit, the after visit summary with patients personalized plan was offered to patient via MyChart   Nurse Notes: States she had flu vaccine this season, but does not remember when. States she no longer needs mammograms or dexa scans. "

## 2024-07-11 ENCOUNTER — Encounter: Admitting: Family Medicine

## 2025-04-18 ENCOUNTER — Ambulatory Visit
